# Patient Record
Sex: Female | Born: 1990 | Hispanic: No | Marital: Single | Smoking: Current every day smoker
Health system: Southern US, Community
[De-identification: ages and names within clinical notes are randomized; demographics above are authoritative.]

## PROBLEM LIST (undated history)

## (undated) ENCOUNTER — Inpatient Hospital Stay: Payer: Self-pay

## (undated) ENCOUNTER — Emergency Department: Admission: EM | Discharge status: Absent without leave | Payer: Medicaid Other

## (undated) DIAGNOSIS — S72092D Other fracture of head and neck of left femur, subsequent encounter for closed fracture with routine healing: Secondary | ICD-10-CM

## (undated) DIAGNOSIS — S322XXA Fracture of coccyx, initial encounter for closed fracture: Secondary | ICD-10-CM

## (undated) DIAGNOSIS — Z3490 Encounter for supervision of normal pregnancy, unspecified, unspecified trimester: Secondary | ICD-10-CM

## (undated) HISTORY — PX: TYMPANOSTOMY TUBE PLACEMENT: SHX32

---
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
## 2012-01-12 DIAGNOSIS — S322XXA Fracture of coccyx, initial encounter for closed fracture: Secondary | ICD-10-CM

## 2012-01-12 DIAGNOSIS — S72092D Other fracture of head and neck of left femur, subsequent encounter for closed fracture with routine healing: Secondary | ICD-10-CM

## 2012-01-12 HISTORY — DX: Fracture of coccyx, initial encounter for closed fracture: S32.2XXA

## 2012-01-12 HISTORY — DX: Other fracture of head and neck of left femur, subsequent encounter for closed fracture with routine healing: S72.092D

## 2012-05-15 ENCOUNTER — Emergency Department: Payer: Self-pay | Admitting: Emergency Medicine

## 2012-05-15 LAB — CBC
MCHC: 34.2 g/dL (ref 32.0–36.0)
Platelet: 378 10*3/uL (ref 150–440)
RDW: 13.6 % (ref 11.5–14.5)

## 2012-05-15 LAB — COMPREHENSIVE METABOLIC PANEL
Albumin: 3.7 g/dL (ref 3.4–5.0)
Anion Gap: 8 (ref 7–16)
BUN: 13 mg/dL (ref 7–18)
Bilirubin,Total: 0.2 mg/dL (ref 0.2–1.0)
Calcium, Total: 9.7 mg/dL (ref 8.5–10.1)
Chloride: 106 mmol/L (ref 98–107)
Creatinine: 0.93 mg/dL (ref 0.60–1.30)
Glucose: 124 mg/dL — ABNORMAL HIGH (ref 65–99)
Osmolality: 275 (ref 275–301)
Sodium: 137 mmol/L (ref 136–145)

## 2012-05-15 LAB — URINALYSIS, COMPLETE
Bilirubin,UR: NEGATIVE
Ketone: NEGATIVE
Protein: 100
RBC,UR: 596 /HPF (ref 0–5)
Squamous Epithelial: 3

## 2012-10-19 ENCOUNTER — Emergency Department: Payer: Self-pay | Admitting: Emergency Medicine

## 2012-10-19 LAB — CBC
HCT: 36.4 % (ref 35.0–47.0)
HGB: 12.5 g/dL (ref 12.0–16.0)
MCV: 85 fL (ref 80–100)
RBC: 4.27 10*6/uL (ref 3.80–5.20)
RDW: 13.7 % (ref 11.5–14.5)
WBC: 8.3 10*3/uL (ref 3.6–11.0)

## 2012-10-19 LAB — TROPONIN I: Troponin-I: <0.02 ng/mL

## 2012-10-19 LAB — BASIC METABOLIC PANEL
Anion Gap: 3 — ABNORMAL LOW (ref 7–16)
BUN: 10 mg/dL (ref 7–18)
Creatinine: 0.78 mg/dL (ref 0.60–1.30)
Potassium: 4.1 mmol/L (ref 3.5–5.1)

## 2013-06-22 ENCOUNTER — Emergency Department: Payer: Self-pay

## 2013-06-22 LAB — COMPREHENSIVE METABOLIC PANEL
ALBUMIN: 3.2 g/dL — AB (ref 3.4–5.0)
ALT: 63 U/L (ref 12–78)
AST: 35 U/L (ref 15–37)
Alkaline Phosphatase: 87 U/L
Anion Gap: 9 (ref 7–16)
BUN: 6 mg/dL — AB (ref 7–18)
Bilirubin,Total: 0.3 mg/dL (ref 0.2–1.0)
CHLORIDE: 106 mmol/L (ref 98–107)
Calcium, Total: 9.2 mg/dL (ref 8.5–10.1)
Co2: 23 mmol/L (ref 21–32)
Creatinine: 0.6 mg/dL (ref 0.60–1.30)
EGFR (African American): >60
Glucose: 95 mg/dL (ref 65–99)
Osmolality: 273 (ref 275–301)
Potassium: 4 mmol/L (ref 3.5–5.1)
Sodium: 138 mmol/L (ref 136–145)
Total Protein: 7.5 g/dL (ref 6.4–8.2)

## 2013-06-22 LAB — CBC WITH DIFFERENTIAL/PLATELET
Basophil #: 0 10*3/uL (ref 0.0–0.1)
Basophil %: 0.4 %
EOS PCT: 0.6 %
Eosinophil #: 0 10*3/uL (ref 0.0–0.7)
HCT: 39 % (ref 35.0–47.0)
HGB: 12.6 g/dL (ref 12.0–16.0)
LYMPHS PCT: 24.1 %
Lymphocyte #: 1.5 10*3/uL (ref 1.0–3.6)
MCH: 28.4 pg (ref 26.0–34.0)
MCHC: 32.3 g/dL (ref 32.0–36.0)
MCV: 88 fL (ref 80–100)
Monocyte #: 0.5 x10 3/mm (ref 0.2–0.9)
Monocyte %: 7.7 %
Neutrophil #: 4.3 10*3/uL (ref 1.4–6.5)
Neutrophil %: 67.2 %
PLATELETS: 424 10*3/uL (ref 150–440)
RBC: 4.45 10*6/uL (ref 3.80–5.20)
RDW: 13.6 % (ref 11.5–14.5)
WBC: 6.4 10*3/uL (ref 3.6–11.0)

## 2013-06-22 LAB — URINALYSIS, COMPLETE
BILIRUBIN, UR: NEGATIVE
GLUCOSE, UR: NEGATIVE mg/dL (ref 0–75)
Nitrite: NEGATIVE
PH: 6 (ref 4.5–8.0)
Protein: NEGATIVE
RBC,UR: 31 /HPF (ref 0–5)
SPECIFIC GRAVITY: 1.024 (ref 1.003–1.030)
Squamous Epithelial: 7
WBC UR: 8 /HPF (ref 0–5)

## 2013-08-27 ENCOUNTER — Emergency Department: Payer: Self-pay | Admitting: Emergency Medicine

## 2013-08-27 LAB — URINALYSIS, COMPLETE
BACTERIA: NONE SEEN
Bilirubin,UR: NEGATIVE
GLUCOSE, UR: NEGATIVE mg/dL (ref 0–75)
Ketone: NEGATIVE
Nitrite: NEGATIVE
PROTEIN: NEGATIVE
Ph: 6 (ref 4.5–8.0)
RBC,UR: 7 /HPF (ref 0–5)
SPECIFIC GRAVITY: 1.021 (ref 1.003–1.030)
Squamous Epithelial: 3
WBC UR: 1 /HPF (ref 0–5)

## 2013-08-27 LAB — WET PREP, GENITAL

## 2013-08-27 LAB — GC/CHLAMYDIA PROBE AMP

## 2014-01-19 ENCOUNTER — Emergency Department: Payer: Self-pay | Admitting: Emergency Medicine

## 2014-01-19 LAB — CBC
HCT: 36.3 % (ref 35.0–47.0)
HGB: 11.8 g/dL — AB (ref 12.0–16.0)
MCH: 28.3 pg (ref 26.0–34.0)
MCHC: 32.4 g/dL (ref 32.0–36.0)
MCV: 87 fL (ref 80–100)
Platelet: 359 10*3/uL (ref 150–440)
RBC: 4.17 10*6/uL (ref 3.80–5.20)
RDW: 13.4 % (ref 11.5–14.5)
WBC: 13.5 10*3/uL — AB (ref 3.6–11.0)

## 2014-01-19 LAB — MONONUCLEOSIS SCREEN: MONO TEST: NEGATIVE

## 2014-01-21 LAB — BETA STREP CULTURE(ARMC)
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
## 2014-01-28 ENCOUNTER — Emergency Department: Payer: Self-pay | Admitting: Emergency Medicine

## 2014-07-02 IMAGING — CR DG CHEST 2V
1 series · 3 of 3 positions shown · non-contrast
Comparison: none

REASON FOR EXAM: Chest Pain
COMMENTS:   LMP: 10/16/12

PROCEDURE:     DXR - DXR CHEST PA (OR AP) AND LATERAL  - October 19, 2012  [DATE]
RESULT:     The lungs are clear. The heart and pulmonary vessels are normal.
The bony and mediastinal structures are unremarkable. There is no effusion.
There is no pneumothorax or evidence of congestive failure.

[Series 1: pa · 0.17mm/px · 3 of 3 slices shown]
[im 1/3]
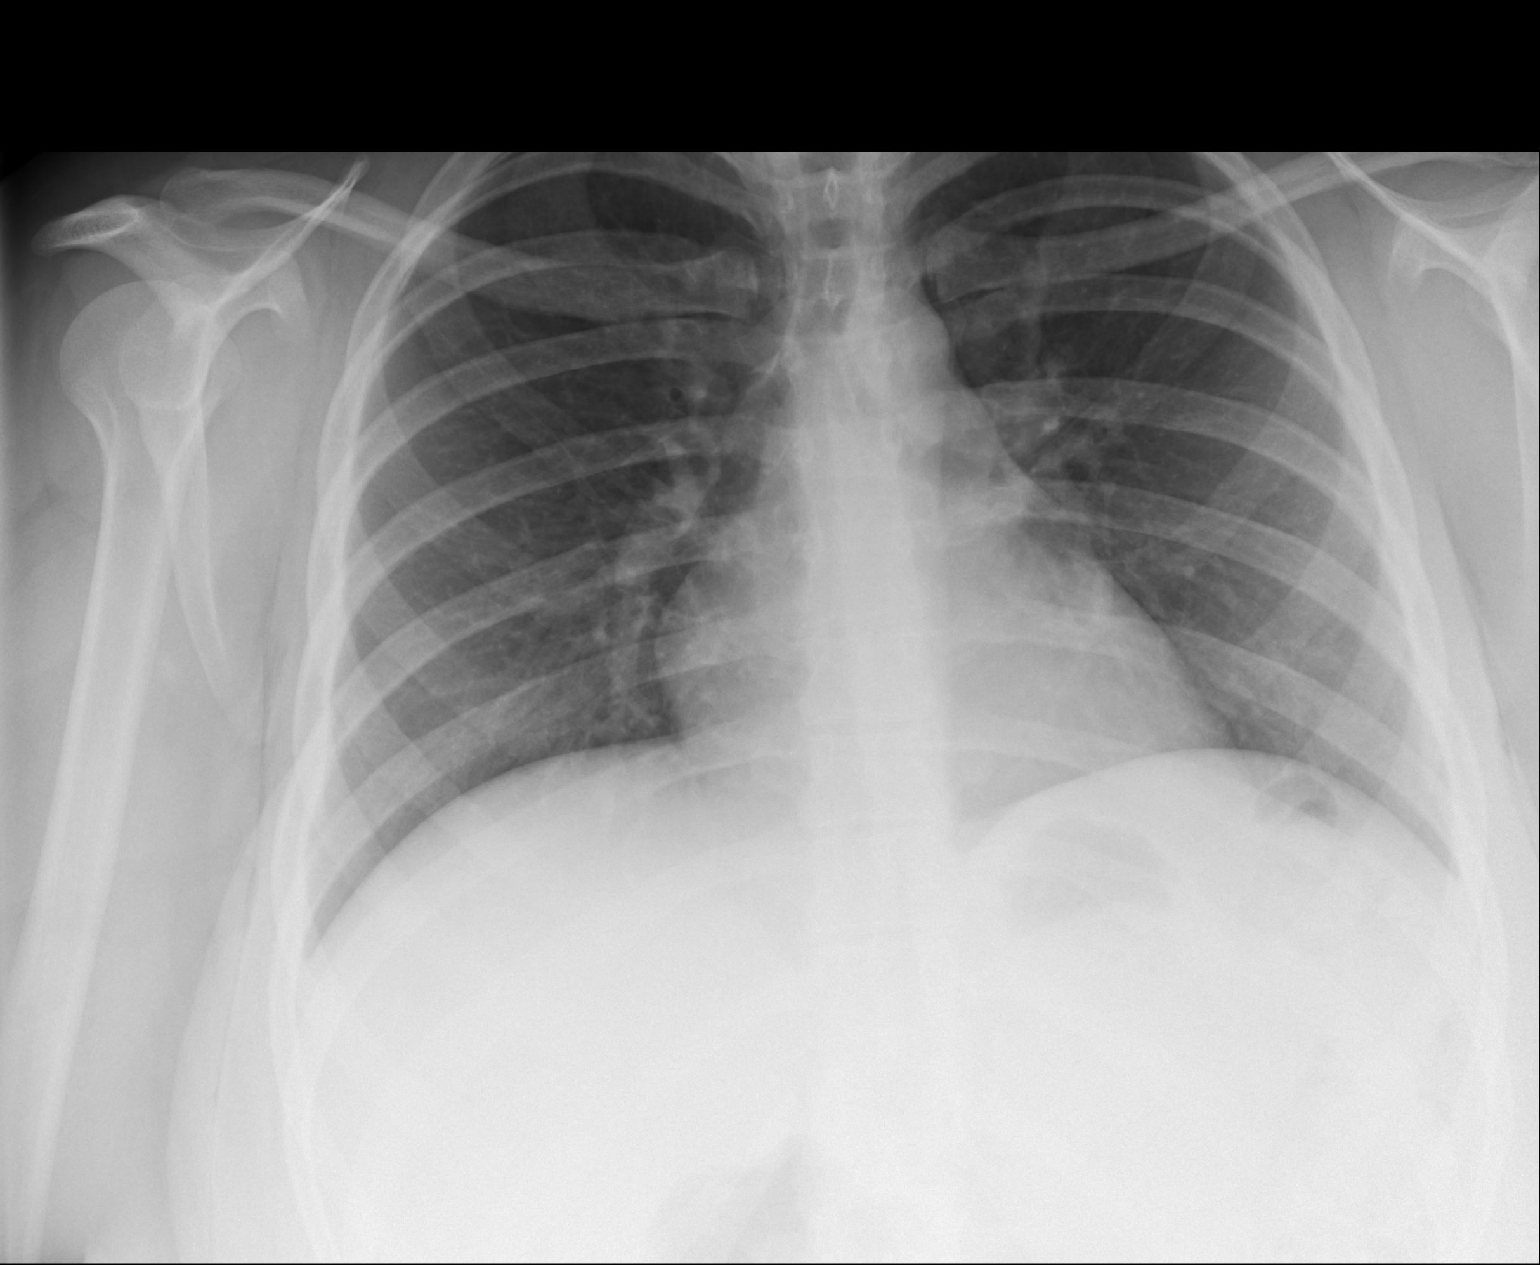
[im 2/3]
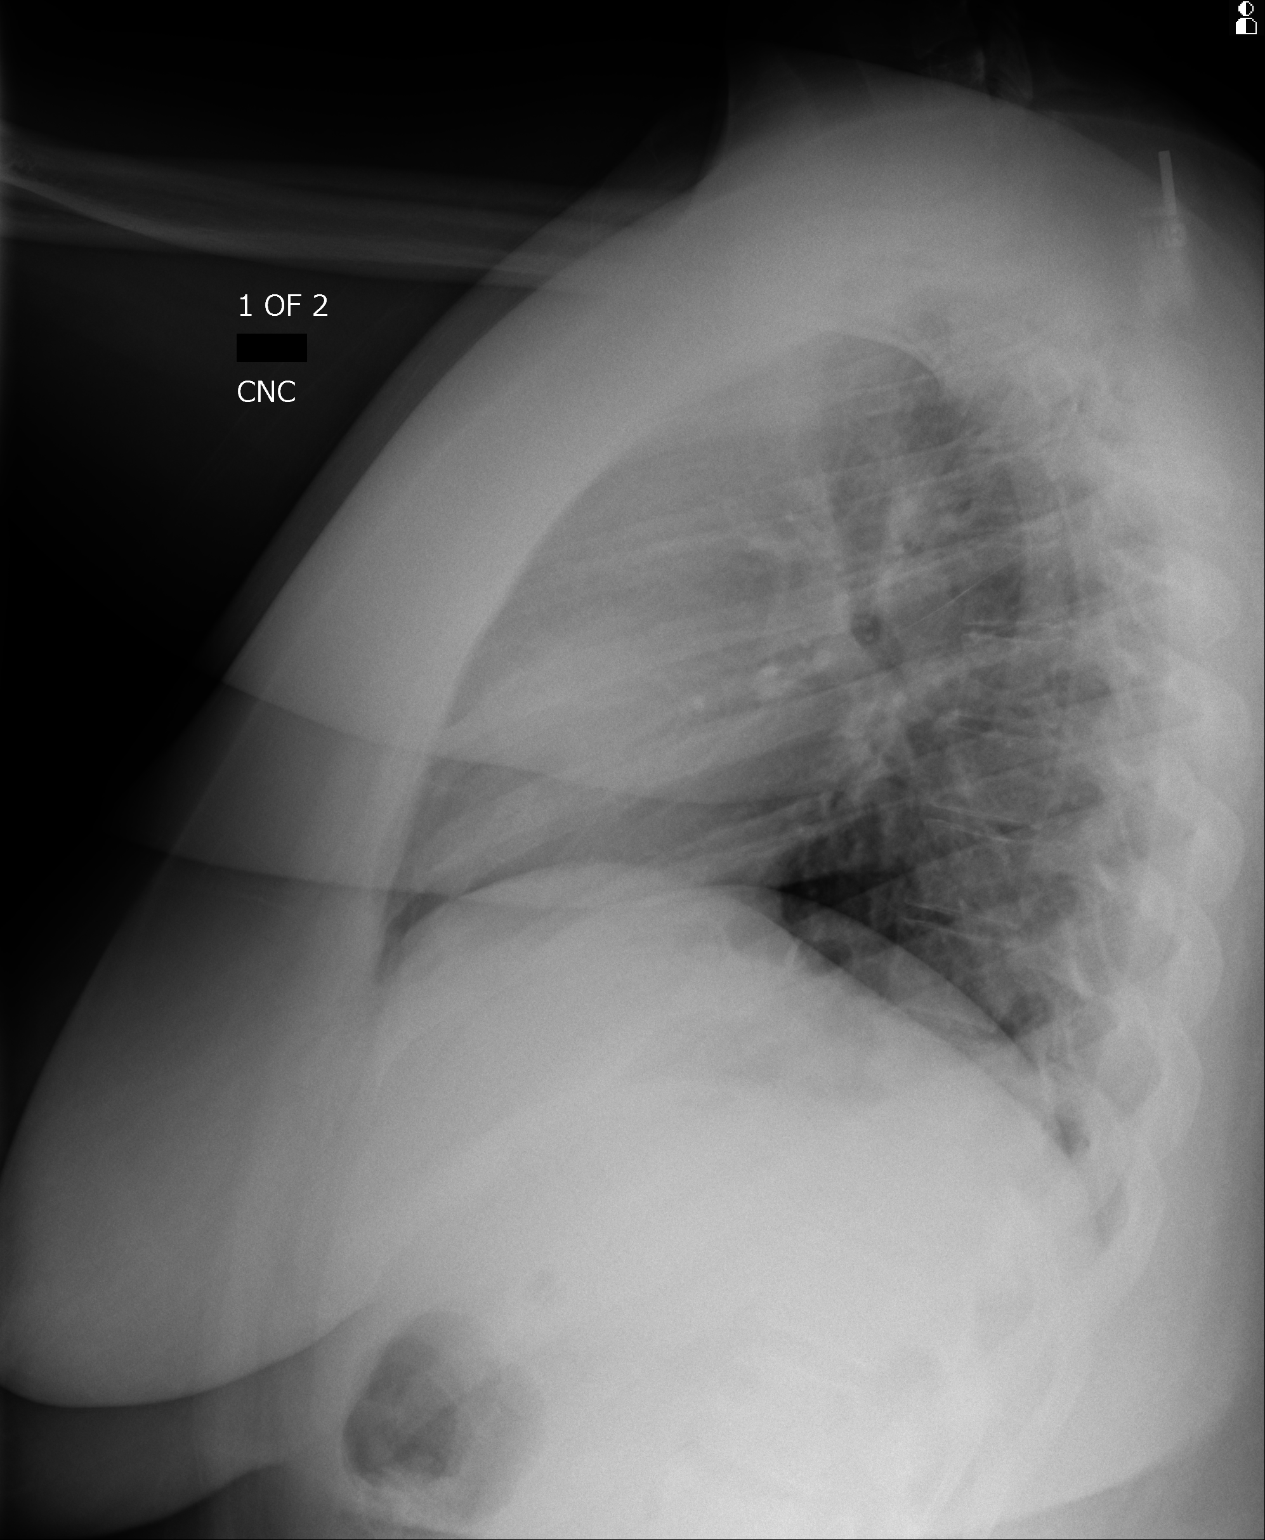
[im 3/3]
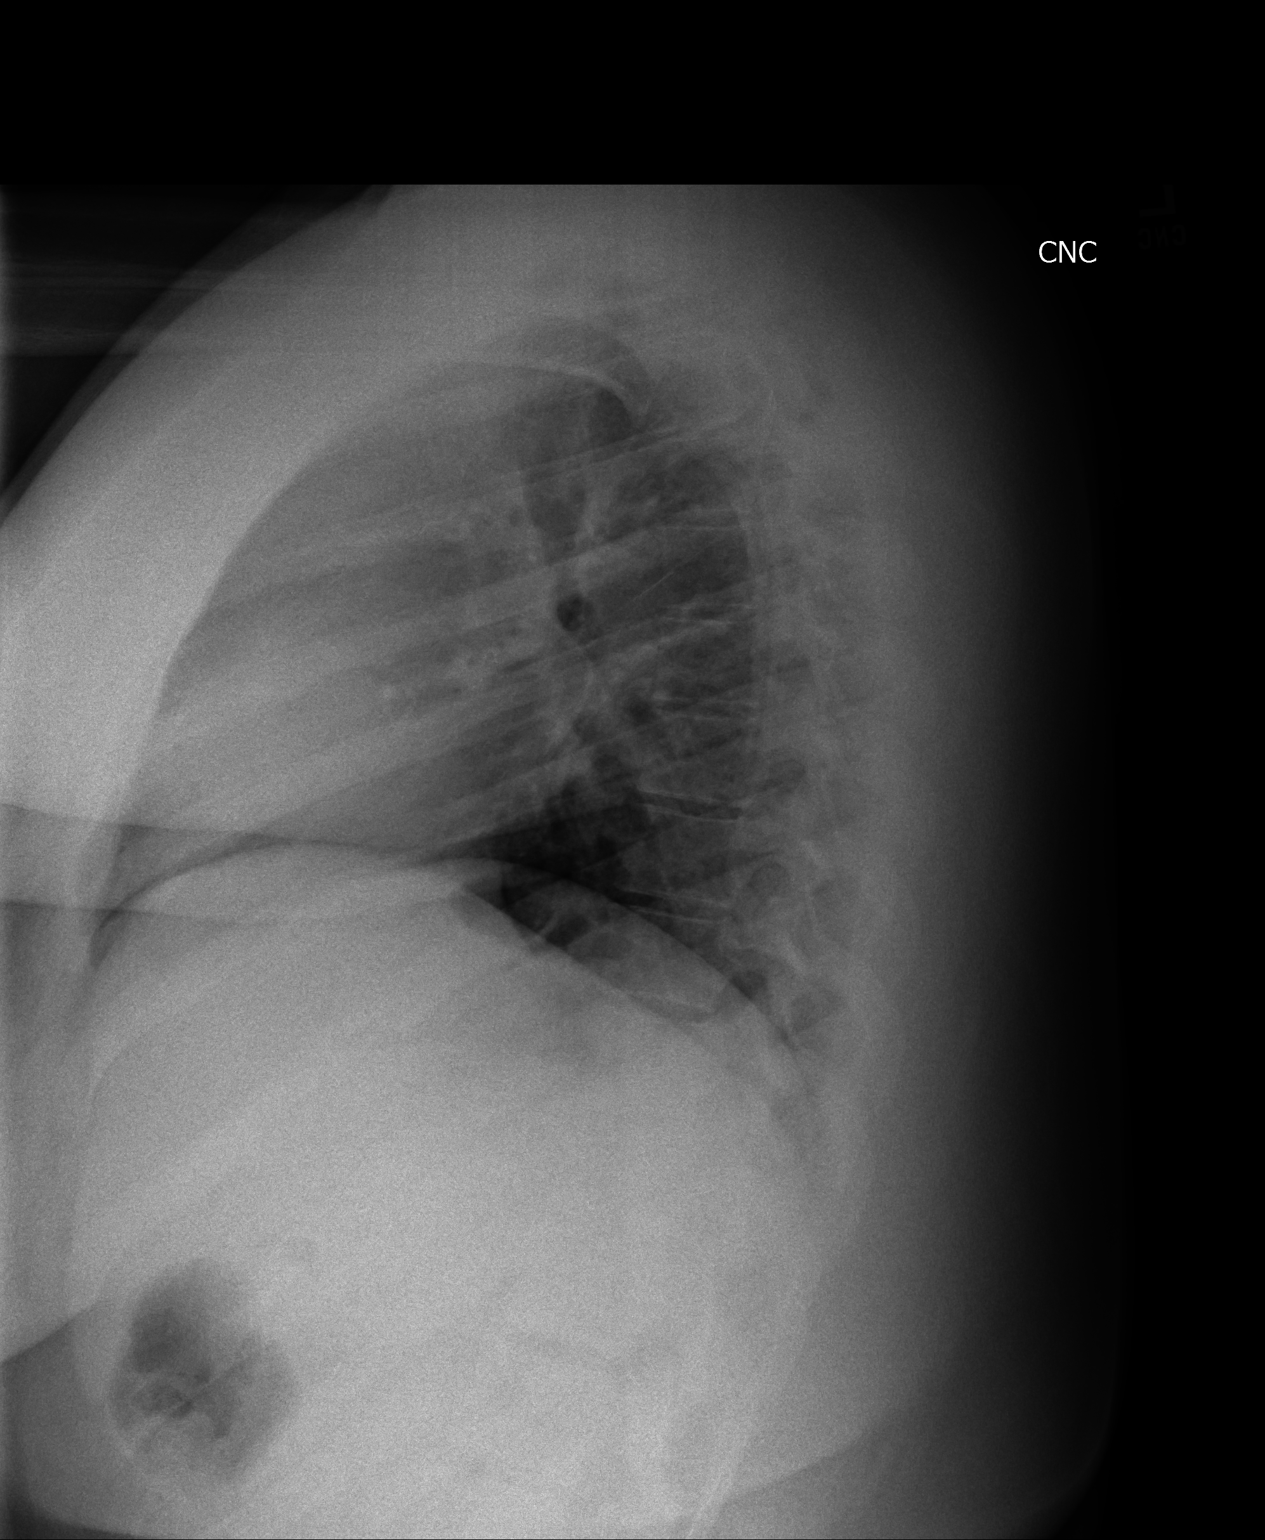

[3 of 3 positions shown; findings below may reference images not displayed]

IMPRESSION: No acute cardiopulmonary disease.

[REDACTED]

## 2014-10-01 ENCOUNTER — Ambulatory Visit (INDEPENDENT_AMBULATORY_CARE_PROVIDER_SITE_OTHER): Payer: BLUE CROSS/BLUE SHIELD | Admitting: Podiatry

## 2014-10-01 ENCOUNTER — Encounter: Payer: Self-pay | Admitting: Podiatry

## 2014-10-01 ENCOUNTER — Ambulatory Visit: Payer: BLUE CROSS/BLUE SHIELD

## 2014-10-01 VITALS — BP 120/69 | HR 67 | Resp 18

## 2014-10-01 DIAGNOSIS — R52 Pain, unspecified: Secondary | ICD-10-CM

## 2014-10-01 DIAGNOSIS — M779 Enthesopathy, unspecified: Secondary | ICD-10-CM | POA: Diagnosis not present

## 2014-10-01 DIAGNOSIS — M8430XA Stress fracture, unspecified site, initial encounter for fracture: Secondary | ICD-10-CM

## 2014-10-01 NOTE — Progress Notes (Signed)
   Subjective:    Patient ID: Susan Cantrell, female    DOB: 1990-10-07, 24 y.o.   MRN: [id number]  HPI  24 year old female presents the office or concerns of bilateral foot pain in the left more significant than the right. She states that she's had pain for about 2-3 weeks. She went to an urgent care/ER in [place] last Thursday she was given pain medicine. She states that he did not take x-rays. She denies any recent injury or trauma. She states her left foot is swollen without any redness or warmth. She's had no other treatment. No other complaints at this time.  She did find out just this morning that she is pregnant. She has not sure of how far along as she is and she has not seen an OB yet.   Review of Systems  All other systems reviewed and are negative.      Objective:   Physical Exam AAO x3, NAD DP/PT pulses palpable bilaterally, CRT less than 3 seconds Protective sensation intact with Simms Weinstein monofilament, vibratory sensation intact, Achilles tendon reflex intact There is mild edema to the dorsal aspect of left forefoot. There is pinpoint bony tenderness and pain vibratory sensation overlying the third metatarsal. There is no gross deformity present. There is mild discomfort along the fifth metatarsal base bilaterally at the insertion of the peroneal tendon. No pain on the course of the peroneal tendon otherwise. No pain with eversion. Peroneal tendons appear to be intact. There is a decrease in medial arch height upon weightbearing bilaterally with equinus present. No other areas of tenderness to bilateral lower extremities. MMT 5/5, ROM WNL.  No open lesions or pre-ulcerative lesions.  No overlying edema, erythema, increase in warmth to bilateral lower extremities.  No pain with calf compression, swelling, warmth, erythema bilaterally.      Assessment & Plan:  24 year old female with possible third metatarsal fracture/stress fracture -Treatment options discussed  including all alternatives, risks, and complications -As she she just noticed more that she was pregnant and has not seen a physician yet for this we're not sure how far along she has. We'll hold off on x-rays for now. -Given the pinpoint tenderness to the third metatarsal concern for stress fracture. Will immobilize and a surgical shoe. -Ice and elevation. -Recommended her to discuss with her physician in regards to anti-inflammatories. -Follow-up in 3 weeks or sooner if any problems arise. In the meantime, encouraged to call the office with any questions, concerns, change in symptoms.   Ovid Curd, DPM

## 2014-10-01 NOTE — Patient Instructions (Signed)
Metatarsal Stress Fracture When too much stress is put on the foot, as in running and jumping sports, the center shaft of the bones of the forefoot is very susceptible to stress fractures (break in bone). This is because of repetitive stress on the bone. This injury is more common if osteoporosis is present or if inadequate running shoes are used. Rapid increase in running distances are often the cause. Running distances should be gradually increased to avoid this problem. Shoes should be used which adequately cushion the foot. Shoes should absorb the shocks of the activity.  DIAGNOSIS  Usually the diagnosis is made by history. The foot progressively becomes sorer with activities. X-rays may be negative (show no break) within the first 2 to 3 weeks of the beginning of pain. A later X-ray may show signs of healing bone (callus formation). A bone scan or MRI will usually make the diagnosis earlier. TREATMENT AND HOME CARE INSTRUCTIONS  Treatment may or may not include a cast, removable fracture boot, or walking shoe. Casts are used for short periods of time to prevent muscle atrophy (muscle wasting).  Activities should be stopped until further advised by your caregiver.  Wear shoes with adequate shock absorbing abilities.  Alternative exercise may be undertaken while waiting for healing. These may include bicycling and swimming, or as your caregiver suggests. If you do not have a cast or splint:  You may walk on your injured foot as tolerated or advised.  Do not put any weight on your injured foot for as long as directed by your caregiver. Slowly increase the amount of time you walk on the foot as the pain allows or as advised.  Use crutches until you can bear weight without pain. A gradual increase in weight bearing may help.  Apply ice to the injury for 15-20 minutes each hour while awake for the first 2 days. Put the ice in a plastic bag and place a towel between the bag of ice and your  skin.  Only take over-the-counter or prescription medicines for pain, discomfort, or fever as directed by your caregiver. SEEK IMMEDIATE MEDICAL CARE IF:   Pain is becoming worse rather than better, or if pain is uncontrolled with medications.  You have increased swelling or redness in the foot. MAKE SURE YOU:   Understand these instructions.  Will watch your condition.  Will get help right away if you are not doing well or get worse. Document Released: 12/26/1999 Document Revised: 03/22/2011 Document Reviewed: 10/24/2007 [employer] Patient Information 2015 [employer]. This information is not intended to replace advice given to you by your health care provider. Make sure you discuss any questions you have with your health care provider.  

## 2014-10-17 ENCOUNTER — Ambulatory Visit (INDEPENDENT_AMBULATORY_CARE_PROVIDER_SITE_OTHER): Payer: BLUE CROSS/BLUE SHIELD | Admitting: Podiatry

## 2014-10-17 ENCOUNTER — Encounter: Payer: Self-pay | Admitting: Podiatry

## 2014-10-17 ENCOUNTER — Ambulatory Visit (INDEPENDENT_AMBULATORY_CARE_PROVIDER_SITE_OTHER): Payer: Medicaid Other

## 2014-10-17 VITALS — BP 116/55 | HR 84 | Resp 18

## 2014-10-17 DIAGNOSIS — M779 Enthesopathy, unspecified: Secondary | ICD-10-CM | POA: Diagnosis not present

## 2014-10-17 DIAGNOSIS — M7750 Other enthesopathy of unspecified foot: Secondary | ICD-10-CM

## 2014-10-17 DIAGNOSIS — R52 Pain, unspecified: Secondary | ICD-10-CM

## 2014-10-17 DIAGNOSIS — M8430XA Stress fracture, unspecified site, initial encounter for fracture: Secondary | ICD-10-CM | POA: Diagnosis not present

## 2014-10-17 MED ORDER — MELOXICAM 15 MG PO TABS: 15.0000 mg | ORAL_TABLET | Freq: Every day | ORAL | Status: DC

## 2014-10-17 NOTE — Progress Notes (Signed)
Patient ID: Susan Cantrell, female   DOB: 1990/05/14, 24 y.o.   MRN: [id number]  Subjective: 24 year old female presents the office they for follow-up evaluation of possible stress fracture to the left foot. She states that the pain has subsided with wearing the surgical shoe. She has tried to go without the surgical shoe. She doesn't expend some swelling which is decreased. Denies any redness of the area. Denies any recent injury or trauma. No other complaints at this time in no acute changes. She does state that she recently had a miscarriage.  Objective: AAO 3, NAD Neurovascular status intact and unchanged There is mild continued tenderness to palpation along the dorsal aspect the left forefoot. There is no pain on the plantar aspect of the forefoot. On palm palpation of the dorsal aspect of the left third metatarsal there is tenderness palpation to this area. There is mild edema to the dorsal aspect of the foot without any assisted erythema or increase in warmth. There is no other areas of pinpoint bony tenderness or tenderness to bilateral lower chemise. There is no open lesions or pre-ulcerative lesions. No pain with calf compression, swelling, warmth, erythema.  Assessment: 24 year old female with resolving left foot pain  Plan: -X-rays were obtained and reviewed with the patient. There is no definitive evidence of acute fracture or stress fracture identified this time -Treatment options discussed including all alternatives, risks, and complications -Prescribed mobic. Discussed side effects of the medication and directed to stop if any are to occur and call the office.  -She inserted transition back into regular shoe as tolerated. Is any increased pain to return to the surgical shoe and call the office. -Discussed wear supportive shoe at all times. -Follow-up in 4 weeks  or sooner if any problems arise. In the meantime, encouraged to call the office with any questions, concerns, change in  symptoms.   Ovid Curd, DPM

## 2014-11-14 ENCOUNTER — Ambulatory Visit (INDEPENDENT_AMBULATORY_CARE_PROVIDER_SITE_OTHER): Payer: BLUE CROSS/BLUE SHIELD | Admitting: Podiatry

## 2014-11-14 DIAGNOSIS — M779 Enthesopathy, unspecified: Secondary | ICD-10-CM

## 2014-11-14 NOTE — Progress Notes (Signed)
Patient ID: Susan Cantrell, female   DOB: 07/06/1990, 24 y.o.   MRN: [id number]  Subjective: 24 year old female presents the office they for follow-up of left foot pain. She states that she has continued the surgical shoe. She denies any pain to her foot denies any swelling. She states that she is much better and what she was. No recent injury or trauma. No other complaints at this time in no acute changes. She does state that she recently had a miscarriage.  Objective: AAO 3, NAD DP/PT pulses 2/4, CRT less than 3 seconds Protective sensation appears intact with Simms Weinstein monofilament At this time there is no areas of tenderness or pinpoint bony tenderness to bilateral lower extremity. There is no pain with vibratory sensation. There is no edema, erythema, increase in warmth. MMT 5/5, ROM WNL no open lesions or pre-ulcerative lesions. There is no pain with calf compression, swelling, warmth, erythema.  Assessment: 24 year old female with resolved left foot pain  Plan: -Treatment options discussed including all alternatives, risks, and complications -At this time she can wear regular shoe as tolerated. Is any increase in pain when he reoccurrence to call the office immediately. She can hold off on anti-inflammatories as her pain is resolved. Follow-up as needed. Call any questions or concerns in the meantime. -Discussed wear supportive shoe at all times.  Ovid CurdMatthew Wagoner, DPM

## 2014-12-26 ENCOUNTER — Emergency Department
Admission: EM | Admit: 2014-12-26 | Discharge: 2014-12-26 | Disposition: A | Discharge status: Home / Self Care | Payer: Medicaid Other | Attending: Emergency Medicine | Admitting: Emergency Medicine

## 2014-12-26 ENCOUNTER — Encounter: Payer: Self-pay | Admitting: *Deleted

## 2014-12-26 DIAGNOSIS — Y9389 Activity, other specified: Secondary | ICD-10-CM | POA: Diagnosis not present

## 2014-12-26 DIAGNOSIS — Y9289 Other specified places as the place of occurrence of the external cause: Secondary | ICD-10-CM | POA: Diagnosis not present

## 2014-12-26 DIAGNOSIS — Z79899 Other long term (current) drug therapy: Secondary | ICD-10-CM | POA: Insufficient documentation

## 2014-12-26 DIAGNOSIS — Y998 Other external cause status: Secondary | ICD-10-CM | POA: Diagnosis not present

## 2014-12-26 DIAGNOSIS — F329 Major depressive disorder, single episode, unspecified: Secondary | ICD-10-CM | POA: Diagnosis not present

## 2014-12-26 DIAGNOSIS — X58XXXA Exposure to other specified factors, initial encounter: Secondary | ICD-10-CM | POA: Insufficient documentation

## 2014-12-26 DIAGNOSIS — T148 Other injury of unspecified body region: Secondary | ICD-10-CM | POA: Diagnosis not present

## 2014-12-26 DIAGNOSIS — Z791 Long term (current) use of non-steroidal anti-inflammatories (NSAID): Secondary | ICD-10-CM | POA: Diagnosis not present

## 2014-12-26 DIAGNOSIS — W57XXXA Bitten or stung by nonvenomous insect and other nonvenomous arthropods, initial encounter: Secondary | ICD-10-CM

## 2014-12-26 DIAGNOSIS — R21 Rash and other nonspecific skin eruption: Secondary | ICD-10-CM | POA: Diagnosis present

## 2014-12-26 MED ORDER — METHYLPREDNISOLONE 4 MG PO TBPK: ORAL_TABLET | ORAL | Status: DC

## 2014-12-26 MED ORDER — HYDROXYZINE HCL 50 MG PO TABS: 50.0000 mg | ORAL_TABLET | Freq: Three times a day (TID) | ORAL | Status: DC | PRN

## 2014-12-26 MED ORDER — DEXAMETHASONE SODIUM PHOSPHATE 10 MG/ML IJ SOLN
10.0000 mg | Freq: Once | INTRAMUSCULAR | Status: AC
  Administered 2014-12-26: 10 mg via INTRAMUSCULAR
  Filled 2014-12-26: qty 1

## 2014-12-26 MED ORDER — HYDROXYZINE HCL 50 MG PO TABS
50.0000 mg | ORAL_TABLET | Freq: Once | ORAL | Status: AC
  Administered 2014-12-26: 50 mg via ORAL
  Filled 2014-12-26: qty 1

## 2014-12-26 NOTE — ED Notes (Signed)
Pt states she sat on her friends bed 2 days ago and now has purple bumps on her left leg that itch

## 2014-12-26 NOTE — ED Notes (Addendum)
Pt complains of left upper thigh  rash, denies pain only itching. Rash is red and localized.Hasn't taken any meds at home.

## 2014-12-26 NOTE — ED Provider Notes (Signed)
[facility] Emergency Department Provider Note  ____________________________________________  Time seen: Approximately 6:45 PM  I have reviewed the triage vital signs and the nursing notes.   HISTORY  Chief Complaint Rash    HPI Susan Cantrell is a 24 y.o. female patient complaining of rash to the upper posterior left thigh for 2 days. Patient denies any pain stated this intense itching. Patient notes redness is localized with papular lesions. No palliative measures taken his complaint. Patient she said at friend's house 2 days ago and noticed the purple bumps on the leg after leaving a friend's house.   History reviewed. No pertinent past medical history.  There are no active problems to display for this patient.   History reviewed. No pertinent past surgical history.  Current Outpatient Rx  Name  Route  Sig  Dispense  Refill  . citalopram (CELEXA) 20 MG tablet   Oral   Take by mouth.         . ferrous sulfate 325 (65 FE) MG tablet   Oral   Take by mouth.         . hydrOXYzine (ATARAX/VISTARIL) 50 MG tablet   Oral   Take 1 tablet (50 mg total) by mouth 3 (three) times daily as needed.   30 tablet   0   . meloxicam (MOBIC) 15 MG tablet   Oral   Take 1 tablet (15 mg total) by mouth daily.   30 tablet   2   . methylPREDNISolone (MEDROL DOSEPAK) 4 MG TBPK tablet      Take Tapered dose as directed   21 tablet   0   . naproxen (NAPROSYN) 500 MG tablet      TK 1 T PO BID WITH MEALS FOR 15 DAYS      0   . NECON 1/35, 28, tablet      TK 1 T PO QD      11     Dispense as written.   . norethindrone-ethinyl estradiol 1/35 (NECON 1/35, 28,) tablet      TAKE 1 TABLET BY MOUTH ONCE A DAY AS DIRECTED.         Marland Kitchen oxyCODONE (OXY IR/ROXICODONE) 5 MG immediate release tablet      Take 1-3 tablets every 4-6 hours as needed for pain. Taper as tolerated. No refills.           Allergies Oxycodone  History reviewed. No  pertinent family history.  Social History Social History  Substance Use Topics  . Smoking status: Never Smoker   . Smokeless tobacco: Never Used  . Alcohol Use: No    Review of Systems Constitutional: No fever/chills Eyes: No visual changes. ENT: No sore throat. Cardiovascular: Denies chest pain. Respiratory: Denies shortness of breath. Gastrointestinal: No abdominal pain.  No nausea, no vomiting.  No diarrhea.  No constipation. Genitourinary: Negative for dysuria. Musculoskeletal: Negative for back pain. Skin: Rash localized to left posterior thigh. Neurological: Negative for headaches, focal weakness or numbness. Psychiatric:Depression Hematological/Lymphatic: Allergic/Immunilogical: Oxycodone  10-point ROS otherwise negative.  ____________________________________________   PHYSICAL EXAM:  VITAL SIGNS: ED Triage Vitals  Enc Vitals Group     BP 12/26/14 1828 138/84 mmHg     Pulse Rate 12/26/14 1828 77     Resp 12/26/14 1828 18     Temp 12/26/14 1828 98.4 F (36.9 C)     Temp Source 12/26/14 1828 Oral     SpO2 12/26/14 1828 96 %     Weight  12/26/14 1828 238 lb (107.956 kg)     Height 12/26/14 1828 5\' 9"  (1.753 m)     Head Cir --      Peak Flow --      Pain Score 12/26/14 1836 0     Pain Loc --      Pain Edu? --      Excl. in GC? --     Constitutional: Alert and oriented. Well appearing and in no acute distress. Eyes: Conjunctivae are normal. PERRL. EOMI. Head: Atraumatic. Nose: No congestion/rhinnorhea. Mouth/Throat: Mucous membranes are moist.  Oropharynx non-erythematous. Neck: No stridor. No cervical spine tenderness to palpation.  Hematological/Lymphatic/Immunilogical: No cervical lymphadenopathy. Cardiovascular: Normal rate, regular rhythm. Grossly normal heart sounds.  Good peripheral circulation. Respiratory: Normal respiratory effort.  No retractions. Lungs CTAB. Gastrointestinal: Soft and nontender. No distention. No abdominal bruits. No CVA  tenderness. Musculoskeletal: No lower extremity tenderness nor edema.  No joint effusions. Neurologic:  Normal speech and language. No gross focal neurologic deficits are appreciated. No gait instability. Skin:  Skin is warm, dry and intact. No rash noted. Psychiatric: Mood and affect are normal. Speech and behavior are normal.  ____________________________________________   LABS (all labs ordered are listed, but only abnormal results are displayed)  Labs Reviewed - No data to display ____________________________________________  EKG   ____________________________________________  RADIOLOGY   ____________________________________________   PROCEDURES  Procedure(s) performed: None  Critical Care performed: No  ____________________________________________   INITIAL IMPRESSION / ASSESSMENT AND PLAN / ED COURSE  Pertinent labs & imaging results that were available during my care of the patient were reviewed by me and considered in my medical decision making (see chart for details).  Localized reaction to insect bites. Patient given discharge care instructions. Patient given prescription for Medrol dosepak and Atarax. Advised to follow-up with family doctor in one week if there is no improvement. ____________________________________________   FINAL CLINICAL IMPRESSION(S) / ED DIAGNOSES  Final diagnoses:  Insect bite      Joni ReiningRonald K Hakeen Shipes, PA-C 12/26/14 1852  Minna AntisKevin Paduchowski, MD 12/26/14 2306

## 2014-12-26 NOTE — ED Notes (Signed)
Pt is waiting post IM injection before discharge. RN error in charting and discharged pt out of epic. Pt at bedside waiting 20 min

## 2015-01-12 NOTE — L&D Delivery Note (Signed)
Delivery Note  First Stage: Induction of Labor: 10/04/15 @1100am  with Cervidil Repeat Cervidil 10/05/15 @ 0000  Pitocin initiated 10/05/15 @ 1533 Labor onset: 10/05/15: 1618 Augmentation : AROM, Pitocin  Analgesia Eliezer Lofts/Anesthesia intrapartum: Epidural AROM on 10/05/15 @1618  - clear fluid   Second Stage: Complete dilation at 0256 Onset of pushing at 0256 FHR second stage: Category 2 / Baseline: 145 bpm/ moderate variability/ + accels/ variable decels to 90 bpm with contractions and good return to baseline   Delivery of a viable female "London" at [phone] by Carlean JewsMeredith Aniello Christopoulos, CNM in ROA position, placed on mom's abdomen.  Baby London, dried, stimulated, and vigorously crying on mom's abdomen.  No nuchal cord Cord double clamped after cessation of pulsation, cut by FOB Cord blood sample N/A  Third Stage: Placenta delivered via Tomasa BlaseSchultz intact with 3 VC @ [phone] Placenta disposition: hospital disposal Uterine tone firm with massage / bleeding moderate, Pitocin 500mL IV Bolus infusing  Small 1st degree perineal laceration identified  Anesthesia for repair: epidural and 1% Lidocaine 30cc Repair: 3.0 Vicryl  Est. Blood Loss (mL): 300mL  Complications: none  Mom to postpartum.  Baby to Couplet care / Skin to Skin.  Newborn: Birth Weight: 3890grams (8#9.2oz) Apgar Scores: 9, 9 Feeding planned: Breast  Carlean JewsMeredith Corbett Moulder, CNM

## 2015-03-04 LAB — OB RESULTS CONSOLE GC/CHLAMYDIA
Chlamydia: NEGATIVE
GC PROBE AMP, GENITAL: NEGATIVE

## 2015-03-05 ENCOUNTER — Other Ambulatory Visit: Payer: Self-pay | Admitting: Obstetrics and Gynecology

## 2015-03-05 DIAGNOSIS — Z369 Encounter for antenatal screening, unspecified: Secondary | ICD-10-CM

## 2015-03-08 LAB — OB RESULTS CONSOLE HIV ANTIBODY (ROUTINE TESTING): HIV: NONREACTIVE

## 2015-03-17 ENCOUNTER — Ambulatory Visit: Payer: Medicaid Other

## 2015-03-24 ENCOUNTER — Ambulatory Visit
Admission: RE | Admit: 2015-03-24 | Discharge: 2015-03-24 | Disposition: A | Discharge status: Home / Self Care | Payer: Medicaid Other | Source: Ambulatory Visit | Attending: Obstetrics and Gynecology | Admitting: Obstetrics and Gynecology

## 2015-03-24 ENCOUNTER — Ambulatory Visit
Admission: RE | Admit: 2015-03-24 | Discharge: 2015-03-24 | Disposition: A | Discharge status: Home / Self Care | Payer: Medicaid Other | Source: Ambulatory Visit | Attending: Maternal and Fetal Medicine | Admitting: Maternal and Fetal Medicine

## 2015-03-24 VITALS — BP 122/90 | HR 78 | Wt 330.0 lb

## 2015-03-24 DIAGNOSIS — Z3491 Encounter for supervision of normal pregnancy, unspecified, first trimester: Secondary | ICD-10-CM | POA: Diagnosis not present

## 2015-03-24 DIAGNOSIS — Z369 Encounter for antenatal screening, unspecified: Secondary | ICD-10-CM

## 2015-03-24 DIAGNOSIS — Z3480 Encounter for supervision of other normal pregnancy, unspecified trimester: Secondary | ICD-10-CM | POA: Diagnosis present

## 2015-03-24 HISTORY — DX: Encounter for antenatal screening, unspecified: Z36.9

## 2015-03-24 NOTE — Progress Notes (Addendum)
Susan Cantrell, Susan Cantrell Length of Consultation: 40 minutes   [name]  [facility] to review prenatal screening and testing options.  This note summarizes the information we discussed.    We offered the following routine screening tests for this pregnancy:  First trimester screening, which includes nuchal translucency ultrasound screen and first trimester maternal serum marker screening.  The nuchal translucency has approximately an 80% detection rate for Down syndrome and can be positive for other chromosome abnormalities as well as congenital heart defects.  When combined with a maternal serum marker screening, the detection rate is up to 90% for Down syndrome and up to 97% for trisomy 18.     Maternal serum marker screening, a blood test that measures pregnancy proteins, can provide risk assessments for Down syndrome, trisomy 18, and open neural tube defects (spina bifida, anencephaly). Because it does not directly examine the fetus, it cannot positively diagnose or rule out these problems.  Targeted ultrasound uses high frequency sound waves to create an image of the developing fetus.  An ultrasound is often recommended as a routine means of evaluating the pregnancy.  It is also used to screen for fetal anatomy problems (for example, a heart defect) that might be suggestive of a chromosomal or other abnormality.   Should these screening tests indicate an increased concern, then the following additional testing options would be offered:  The chorionic villus sampling procedure is available for first trimester chromosome analysis.  This involves the withdrawal of a small amount of chorionic villi (tissue from the developing placenta).  Risk of pregnancy loss is estimated to be approximately 1 in 200 to 1 in 100 (0.5 to 1%).  There is approximately a 1% (1 in 100) chance that the CVS chromosome results will be unclear.  Chorionic villi cannot be  tested for neural tube defects.     Amniocentesis involves the removal of a small amount of amniotic fluid from the sac surrounding the fetus with the use of a thin needle inserted through the maternal abdomen and uterus.  Ultrasound guidance is used throughout the procedure.  Fetal cells from amniotic fluid are directly evaluated and > 99.5% of chromosome problems and > 98% of open neural tube defects can be detected. This procedure is generally performed after the 15th week of pregnancy.  The main risks to this procedure include complications leading to miscarriage in less than 1 in 200 cases (0.5%).  As another option for information if the pregnancy is suspected to be an an increased chance for certain chromosome conditions, we also reviewed the availability of cell free fetal DNA testing from maternal blood to determine whether or not the baby may have either Down syndrome, trisomy 7513, or trisomy 7918.  This test utilizes a maternal blood sample and DNA sequencing technology to isolate circulating cell free fetal DNA from maternal plasma.  The fetal DNA can then be analyzed for DNA sequences that are derived from the three most common chromosomes involved in aneuploidy, chromosomes 13, 18, and 21.  If the overall amount of DNA is greater than the expected level for any of these chromosomes, aneuploidy is suspected.  While we do not consider it a replacement for invasive testing and karyotype analysis, a negative result from this testing would be reassuring, though not a guarantee of a normal chromosome complement for the baby.  An abnormal result is certainly suggestive of an abnormal chromosome complement, though we would still recommend CVS  or amniocentesis to confirm any findings from this testing.   Cystic Fibrosis screening was also discussed with the patient. Cystic fibrosis (CF) is one of the most common genetic conditions in persons of Caucasian ancestry.  This condition occurs in approximately 1  in 2,500 Caucasian persons and results in thickened secretions in the lungs, digestive, and reproductive systems.  For a baby to be at risk for having CF, both of the parents must be carriers for this condition.  Approximately 1 in 39 Caucasian persons is a carrier for CF.  Current carrier testing looks for the most common mutations in the gene for CF and can detect approximately 90% of carriers in the Caucasian population.  This means that the carrier screening can greatly reduce, but cannot eliminate, the chance for an individual to have a child with CF.  If an individual is found to be a carrier for CF, then carrier testing would be available for the partner. As part of [place]'s newborn screening profile, all babies born in the state of [place] will have a two-tier screening process.  Specimens are first tested to determine the concentration of immunoreactive trypsinogen (IRT).  The top 5% of specimens with the highest IRT values then undergo DNA testing using a panel of over 40 common CF mutations.   We obtained a detailed family history and pregnancy history.  This is the first pregnancy for both the patient and her partner.  The patient stated that her maternal grandmother and great grandmother both had breast cancer. The grandmother was in her 31s and the age at diagnosis of the great grandmother is not known. We reviewed that the majority of breast cancer occurs by chance, particularly when it is diagnosed after menopause.  When there are multiple family members with breast cancer in their earlier years, the chance for a genetic factor is increased.  This family history is not particularly concerning for a strong genetic factor, but if any other family members were to be diagnosed, particularly prior to menopause, then a referral to cancer genetic counseling could be considered.  The remainder of the family history was reported to be unremarkable for birth defects, mental retardation,  recurrent pregnancy loss or known chromosome abnormalities.  [name] reported no complications or exposures in this pregnancy that would be expected to increase the risk for birth defects.  After consideration of the options, [name] elected to proceed with an ultrasound and first trimester screening.  Labs were not yet available to assess her sickle cell carrier status, but are typically performed with the prenatal labs at her OB.  The patient declined CF and SMA carrier screening.  An ultrasound was performed at the time of the visit.  The gestational age was consistent with 13 weeks.  Fetal anatomy could not be assessed due to early gestational age.  Please refer to the ultrasound report for details of that study.  [name] was encouraged to call with questions or concerns.  We can be contacted at [phone].  Cherly Anderson, MS, CGC  I was immediately available and supervising. Camelia Phenes, MD [facility]

## 2015-03-27 ENCOUNTER — Telehealth: Payer: Self-pay | Admitting: Obstetrics and Gynecology

## 2015-03-27 NOTE — Telephone Encounter (Signed)
  [name] elected to undergo First Trimester screening on 03/24/2015.  To review, first trimester screening, includes nuchal translucency ultrasound screen and/or first trimester maternal serum marker screening.  The nuchal translucency has approximately an 80% detection rate for Down syndrome and can be positive for other chromosome abnormalities as well as heart defects.  When combined with a maternal serum marker screening, the detection rate is up to 90% for Down syndrome and up to 97% for trisomy 13 and 18.     The results of the First Trimester Nuchal Translucency and Biochemical Screening were within normal range.  The risk for Down syndrome is now estimated to be less than 1 in 10,000.  The risk for Trisomy 13/18 is also estimated to be less than 1 in 10,000.  Should more definitive information be desired, we would offer amniocentesis.  Because we do not yet know the effectiveness of combined first and second trimester screening, we do not recommend a maternal serum screen to assess the chance for chromosome conditions.  However, if screening for neural tube defects is desired, maternal serum screening for AFP only can be performed between 15 and [redacted] weeks gestation.      Cherly Andersoneborah F. Danise Dehne, MS, CGC

## 2015-04-08 LAB — OB RESULTS CONSOLE RUBELLA ANTIBODY, IGM: Rubella: IMMUNE

## 2015-04-08 LAB — OB RESULTS CONSOLE RPR: RPR: NONREACTIVE

## 2015-04-08 LAB — OB RESULTS CONSOLE HEPATITIS B SURFACE ANTIGEN: HEP B S AG: NEGATIVE

## 2015-04-08 LAB — OB RESULTS CONSOLE HIV ANTIBODY (ROUTINE TESTING): HIV: NONREACTIVE

## 2015-04-08 LAB — OB RESULTS CONSOLE VARICELLA ZOSTER ANTIBODY, IGG: VARICELLA IGG: IMMUNE

## 2015-04-26 ENCOUNTER — Emergency Department: Payer: Medicaid Other

## 2015-04-26 ENCOUNTER — Encounter: Payer: Self-pay | Admitting: Emergency Medicine

## 2015-04-26 ENCOUNTER — Emergency Department
Admission: EM | Admit: 2015-04-26 | Discharge: 2015-04-26 | Disposition: A | Discharge status: Home / Self Care | Payer: Medicaid Other | Attending: Emergency Medicine | Admitting: Emergency Medicine

## 2015-04-26 DIAGNOSIS — M79661 Pain in right lower leg: Secondary | ICD-10-CM | POA: Diagnosis present

## 2015-04-26 DIAGNOSIS — X58XXXA Exposure to other specified factors, initial encounter: Secondary | ICD-10-CM | POA: Diagnosis not present

## 2015-04-26 DIAGNOSIS — Z3A17 17 weeks gestation of pregnancy: Secondary | ICD-10-CM | POA: Diagnosis not present

## 2015-04-26 DIAGNOSIS — Y939 Activity, unspecified: Secondary | ICD-10-CM | POA: Insufficient documentation

## 2015-04-26 DIAGNOSIS — O9989 Other specified diseases and conditions complicating pregnancy, childbirth and the puerperium: Secondary | ICD-10-CM | POA: Insufficient documentation

## 2015-04-26 DIAGNOSIS — Y929 Unspecified place or not applicable: Secondary | ICD-10-CM | POA: Diagnosis not present

## 2015-04-26 DIAGNOSIS — Y999 Unspecified external cause status: Secondary | ICD-10-CM | POA: Insufficient documentation

## 2015-04-26 DIAGNOSIS — S86111A Strain of other muscle(s) and tendon(s) of posterior muscle group at lower leg level, right leg, initial encounter: Secondary | ICD-10-CM

## 2015-04-26 DIAGNOSIS — S86811A Strain of other muscle(s) and tendon(s) at lower leg level, right leg, initial encounter: Secondary | ICD-10-CM | POA: Insufficient documentation

## 2015-04-26 HISTORY — DX: Encounter for supervision of normal pregnancy, unspecified, unspecified trimester: Z34.90

## 2015-04-26 NOTE — ED Provider Notes (Signed)
[facility] Emergency Department Provider Note ____________________________________________  Time seen: 1525  I have reviewed the triage vital signs and the nursing notes.  HISTORY  Chief Complaint  Leg Pain  HPI Susan Cantrell is a 25 y.o. female presents to the ED for evaluation of right half pain with onset yesterday. She is [redacted] weeks pregnant with a singleton pregnancy. She denies any particular the complications. She denies any recent injury, trauma, accident, or prolonged sitting. She also denies any swelling to the legs. She noted tenderness to the mid calf yesterday, describing what she noted as a "knot". She denies any distal paresthesias, shortness of breath, or anxiety. She rates her discomfort at a 7/10 in triage. She describes the pain as throbbing and tightness intermittently. She notes that it is increased with dorsiflexion of the ankle.  Past Medical History  Diagnosis Date  . Pregnancy     [facility] 09/29/2015    Patient Active Problem List   Diagnosis Date Noted  . First trimester screening 03/24/2015    History reviewed. No pertinent past surgical history.  Current Outpatient Rx  Name  Route  Sig  Dispense  Refill  . citalopram (CELEXA) 20 MG tablet   Oral   Take by mouth. Reported on 03/24/2015         . ferrous sulfate 325 (65 FE) MG tablet   Oral   Take by mouth. Reported on 03/24/2015         . hydrOXYzine (ATARAX/VISTARIL) 50 MG tablet   Oral   Take 1 tablet (50 mg total) by mouth 3 (three) times daily as needed. Patient not taking: Reported on 03/24/2015   30 tablet   0   . meloxicam (MOBIC) 15 MG tablet   Oral   Take 1 tablet (15 mg total) by mouth daily. Patient not taking: Reported on 03/24/2015   30 tablet   2   . methylPREDNISolone (MEDROL DOSEPAK) 4 MG TBPK tablet      Take Tapered dose as directed Patient not taking: Reported on 03/24/2015   21 tablet   0   . naproxen (NAPROSYN) 500 MG tablet      Reported on  03/24/2015      0   . NECON 1/35, 28, tablet      Reported on 03/24/2015      11     Dispense as written.   . norethindrone-ethinyl estradiol 1/35 (NECON 1/35, 28,) tablet      Reported on 03/24/2015         . oxyCODONE (OXY IR/ROXICODONE) 5 MG immediate release tablet      Reported on 03/24/2015         . Prenatal Vit-Fe Fumarate-FA (PRENATAL MULTIVITAMIN) TABS tablet   Oral   Take 1 tablet by mouth daily at 12 noon.          Allergies Oxycodone  Family History  Problem Relation Age of Onset  . Asthma Brother   . Cancer Maternal Grandmother   . Diabetes Maternal Grandmother     Social History Social History  Substance Use Topics  . Smoking status: Never Smoker   . Smokeless tobacco: Never Used  . Alcohol Use: No   Review of Systems  Constitutional: Negative for fever. Cardiovascular: Negative for chest pain. Respiratory: Negative for shortness of breath. Gastrointestinal: Negative for abdominal pain, vomiting and diarrhea. Genitourinary: Negative for dysuria. Musculoskeletal: Negative for back pain. Right calf pain as above Skin: Negative for rash. Neurological: Negative for  headaches, focal weakness or numbness. ____________________________________________  PHYSICAL EXAM:  VITAL SIGNS: ED Triage Vitals  Enc Vitals Group     BP 04/26/15 1204 109/66 mmHg     Pulse Rate 04/26/15 1204 85     Resp 04/26/15 1204 20     Temp 04/26/15 1204 98.2 F (36.8 C)     Temp Source 04/26/15 1204 Oral     SpO2 04/26/15 1204 97 %     Weight 04/26/15 1204 328 lb (148.78 kg)     Height 04/26/15 1204 5\' 8"  (1.727 m)     Head Cir --      Peak Flow --      Pain Score 04/26/15 1206 7     Pain Loc --      Pain Edu? --      Excl. in GC? --    Constitutional: Alert and oriented. Well appearing and in no distress. Head: Normocephalic and atraumatic     Eyes: Conjunctivae are normal. PERRL. Normal extraocular movements Hematological/Lymphatic/Immunological: No  cervical lymphadenopathy. Cardiovascular: Normal rate, regular rhythm. Normal distal pulses.  Respiratory: Normal respiratory effort. No wheezes/rales/rhonchi. Gastrointestinal: Soft and nontender. No distention, rebound, guarding. Musculoskeletal: Right calf with tenderness to palp at the medial gastroc. No palpable chords, or muscle defect. No popliteal fossa fullness. Normal ankle dorsiflexion/plantarflexion. No achilles tenderness. Nontender with normal range of motion in all extremities.  Neurologic:  Normal gait without ataxia. Normal speech and language. No gross focal neurologic deficits are appreciated. Skin:  Skin is warm, dry and intact. No rash noted. No erythema, ecchymosis, or bruising.  Psychiatric: Mood and affect are normal. Patient exhibits appropriate insight and judgment. ____________________________________________   LABS (pertinent positives/negatives) Labs Reviewed - No data to display ____________________________________________   RADIOLOGY  RLE Ultrasound/Doppler IMPRESSION: No evidence DVT within the right lower extremity. ____________________________________________  INITIAL IMPRESSION / ASSESSMENT AND PLAN / ED COURSE  Patient with a negative ultrasound/Doppler study for DVT. Her exam seems consistent with an acute muscle strain to the right gastrocnemius. She is advised to apply ice therapy and/or moist heat to the area. She is also advised to dose Tylenol as needed for pain. She will follow with a primary provider or OB provider for ongoing symptom management. Patient is reassured by exam and diagnostic testing. ____________________________________________  FINAL CLINICAL IMPRESSION(S) / ED DIAGNOSES  Final diagnoses:  Gastrocnemius muscle strain, right, initial encounter      Lissa HoardJenise V Bacon Darlisa Spruiell, PA-C 04/26/15 1654  Sharyn CreamerMark Quale, MD 04/27/15 [phone]

## 2015-04-26 NOTE — Discharge Instructions (Signed)
Muscle Strain A muscle strain is an injury that occurs when a muscle is stretched beyond its normal length. Usually a small number of muscle fibers are torn when this happens. Muscle strain is rated in degrees. First-degree strains have the least amount of muscle fiber tearing and pain. Second-degree and third-degree strains have increasingly more tearing and pain.  Usually, recovery from muscle strain takes 1-2 weeks. Complete healing takes 5-6 weeks.  CAUSES  Muscle strain happens when a sudden, violent force placed on a muscle stretches it too far. This may occur with lifting, sports, or a fall.  RISK FACTORS Muscle strain is especially common in athletes.  SIGNS AND SYMPTOMS At the site of the muscle strain, there may be:  Pain.  Bruising.  Swelling.  Difficulty using the muscle due to pain or lack of normal function. DIAGNOSIS  Your health care provider will perform a physical exam and ask about your medical history. TREATMENT  Often, the best treatment for a muscle strain is resting, icing, and applying cold compresses to the injured area.  HOME CARE INSTRUCTIONS   Use the PRICE method of treatment to promote muscle healing during the first 2-3 days after your injury. The PRICE method involves:  Protecting the muscle from being injured again.  Restricting your activity and resting the injured body part.  Icing your injury. To do this, put ice in a plastic bag. Place a towel between your skin and the bag. Then, apply the ice and leave it on from 15-20 minutes each hour. After the third day, switch to moist heat packs.  Apply compression to the injured area with a splint or elastic bandage. Be careful not to wrap it too tightly. This may interfere with blood circulation or increase swelling.  Elevate the injured body part above the level of your heart as often as you can.  Only take over-the-counter or prescription medicines for pain, discomfort, or fever as directed by your  health care provider.  Warming up prior to exercise helps to prevent future muscle strains. SEEK MEDICAL CARE IF:   You have increasing pain or swelling in the injured area.  You have numbness, tingling, or a significant loss of strength in the injured area. MAKE SURE YOU:   Understand these instructions.  Will watch your condition.  Will get help right away if you are not doing well or get worse.   This information is not intended to replace advice given to you by your health care provider. Make sure you discuss any questions you have with your health care provider.   Document Released: 12/28/2004 Document Revised: 10/18/2012 Document Reviewed: 07/27/2012 Elsevier Interactive Patient Education 2016 [employer].  Medial Head Gastrocnemius Tear With Rehab Medial head gastrocnemius tear, also called tennis leg, is a tear (strain) in a muscle or tendon of the inner portion (medial head) of one of the calf muscles (gastrocnemius). The inner portion of the calf muscle attaches to the thigh bone (femur) and is responsible for bending the knee and straightening the foot (standing "on tiptoe"). Strains are classified into three categories. Grade 1 strains cause pain, but the tendon is not lengthened. Grade 2 strains include a lengthened ligament, due to the ligament being stretched or partially ruptured. With grade 2 strains there is still function, although function may be decreased. Grade 3 strains involve a complete tear of the tendon or muscle, and function is usually impaired. SYMPTOMS   Sudden "pop" or tear felt at the time of injury.  Pain, tenderness, swelling, warmth, or redness over the middle inner calf.  Pain and weakness with ankle motion, especially flexing the ankle against resistance, as well as pain with lifting up the foot (extending the ankle).  Bruising (contusion) of the calf, heel, and sometimes the foot within 48 hours of injury.  Muscle spasm in the calf. CAUSES    Muscle and ligament strains occur when a force is placed on the muscle or ligament that is greater than it can handle. Common causes of injury include:  Direct hit (trauma) to the calf.  Sudden forceful pushing off or landing on the foot (jumping, landing, serving a tennis ball, lunging). RISK INCREASES WITH:  Sports that require sudden, explosive calf muscle contraction, such as those involving jumping (basketball), hill running, quick starts (running), or lunging (racquetball, tennis).  Contact sports (football, soccer, hockey).  Poor strength and flexibility.  Previous lower limb injury. PREVENTION  Warm up and stretch properly before activity.  Allow for adequate recovery between workouts.  Maintain physical fitness:  Strength, flexibility, and endurance.  Cardiovascular fitness.  Learn and use proper exercise technique.  Complete rehabilitation after lower limb injury, before returning to competition or practice. PROGNOSIS  If treated properly, tennis leg usually heals within 6 weeks of nonsurgical treatment.  RELATED COMPLICATIONS   Longer healing time, if not properly treated or if not given enough time to heal.  Recurring symptoms and injury, if activity is resumed too soon, with overuse, with a direct blow, or with poor technique.  If untreated, may progress to a complete tear (rare) or other injury, due to limping and favoring of the injured leg.  Persistent limping, due to scarring and shortening of the calf muscles, as a result of inadequate rehabilitation.  Prolonged disability. TREATMENT  Treatment first involves the use of ice and medication to help reduce pain and inflammation. The use of strengthening and stretching exercises may help reduce pain with activity. These exercises may be performed at home or with a therapist. For severe injuries, referral to a therapist may be needed for further evaluation and treatment. Your caregiver may advise that you  wear a brace to help healing. Sometimes, crutches are needed until you can walk without limping. Rarely, surgery is needed.  MEDICATION   If pain medicine is needed, nonsteroidal anti-inflammatory medicines (aspirin and ibuprofen), or other minor pain relievers (acetaminophen), are often advised.  Do not take pain medicine for 7 days before surgery.  Prescription pain relievers may be given, if your caregiver thinks they are needed. Use only as directed and only as much as you need. HEAT AND COLD  Cold treatment (icing) should be applied for 10 to 15 minutes every 2 to 3 hours for inflammation and pain, and immediately after activity that aggravates your symptoms. Use ice packs or an ice massage.  Heat treatment may be used before performing stretching and strengthening activities prescribed by your caregiver, physical therapist, or athletic trainer. Use a heat pack or a warm water soak. SEEK MEDICAL CARE IF:   Symptoms get worse or do not improve in 2 weeks, despite treatment.  Numbness or tingling develops.  New, unexplained symptoms develop. (Drugs used in treatment may produce side effects.) EXERCISES  RANGE OF MOTION (ROM) AND STRETCHING EXERCISES - Medial Head Gastrocnemius Tear (Tennis Leg) These exercises may help you when beginning to rehabilitate your injury. Your symptoms may resolve with or without further involvement from your physician, physical therapist, or athletic trainer. While completing these exercises, remember:  Restoring tissue flexibility helps normal motion to return to the joints. This allows healthier, less painful movement and activity.  An effective stretch should be held for at least 30 seconds.  A stretch should never be painful. You should only feel a gentle lengthening or release in the stretched tissue. STRETCH - Gastrocsoleus  Sit with your right / left leg extended. Holding onto both ends of a belt or towel, loop it around the ball of your  foot.  Keeping your right / left ankle and foot relaxed and your knee straight, pull your foot and ankle toward you using the belt.  You should feel a gentle stretch behind your calf or knee. Hold this position for __________ seconds. Repeat __________ times. Complete this stretch __________ times per day.  RANGE OF MOTION - Ankle Dorsiflexion, Active Assisted   Remove your shoes and sit on a chair, preferably not on a carpeted surface.  Place your right / left foot directly under the knee. Extend your opposite leg for support.  Keeping your heel down, slide your right / left foot back toward the chair, until you feel a stretch at your ankle or calf. If you do not feel a stretch, slide your bottom forward to the edge of the chair, while still keeping your heel down.  Hold this stretch for __________ seconds. Repeat __________ times. Complete this stretch __________ times per day.  STRETCH - Gastroc, Standing   Place your hands on a wall.  Extend your right / left leg behind you, keeping the front knee somewhat bent.  Slightly point your toes inward on your back foot.  Keeping your right / left heel on the floor and your knee straight, shift your weight toward the wall, not allowing your back to arch.  You should feel a gentle stretch in the right / left calf. Hold this position for __________ seconds. Repeat __________ times. Complete this stretch __________ times per day. STRETCH - Soleus, Standing   Place your hands on a wall.  Extend your right / left leg behind you, keeping the other knee somewhat bent.  Point your toes of your back foot slightly inward.  Keep your right / left heel on the floor, bend your back knee, and slightly shift your weight over the back leg so that you feel a gentle stretch deep in your back calf.  Hold this position for __________ seconds. Repeat __________ times. Complete this stretch __________ times per day. STRETCH - Gastrocsoleus,  Standing Note: This exercise can place a lot of stress on your foot and ankle. Please complete this exercise only if specifically instructed by your caregiver.   Place the ball of your right / left foot on a step, keeping your other foot firmly on the same step.  Hold on to the wall or a rail for balance.  Slowly lift your other foot, allowing your body weight to press your heel down over the edge of the step.  You should feel a stretch in your right / left calf.  Hold this position for __________ seconds.  Repeat this exercise with a slight bend in your right / left knee. Repeat __________ times. Complete this stretch __________ times per day.  STRENGTHENING EXERCISES - Medial Head Gastrocnemius Tear (Tennis Leg) These exercises may help you when beginning to rehabilitate your injury. They may resolve your symptoms with or without further involvement from your physician, physical therapist, or athletic trainer. While completing these exercises, remember:   Muscles can gain both  the endurance and the strength needed for everyday activities through controlled exercises.  Complete these exercises as instructed by your physician, physical therapist, or athletic trainer. Increase the resistance and repetitions only as guided by your caregiver. STRENGTH - Plantar-flexors  Sit with your right / left leg extended. Holding onto both ends of a rubber exercise band or tubing, loop it around the ball of your foot. Keep a slight tension in the band.  Slowly push your toes away from you, pointing them downward.  Hold this position for __________ seconds. Return slowly, controlling the tension in the band. Repeat __________ times. Complete this exercise __________ times per day.  STRENGTH - Plantar-flexors  Stand with your feet shoulder width apart. Steady yourself with a wall or table, using as little support as needed.  Keeping your weight evenly spread over the width of your feet, rise up on  your toes.*  Hold this position for __________ seconds. Repeat __________ times. Complete this exercise __________ times per day.  *If this is too easy, shift your weight toward your right / left leg until you feel challenged. Ultimately, you may be asked to do this exercise while standing on your right / left foot only. STRENGTH - Plantar-flexors, Eccentric Note: This exercise can place a lot of stress on your foot and ankle. Please complete this exercise only if specifically instructed by your caregiver.   Place the balls of your feet on a step. With your hands, use only enough support from a wall or rail to keep your balance.  Keep your knees straight and rise up on your toes.  Slowly shift your weight entirely to your right / left toes and pick up your opposite foot. Gently and with controlled movement, lower your weight through your right / left foot so that your heel drops below the level of the step. You will feel a slight stretch in the back of your right / left calf.  Use the healthy leg to help rise up onto the balls of both feet, then lower weight only onto the right / left leg again. Build up to 15 repetitions. Then progress to 3 sets of 15 repetitions.*  After completing the above exercise, complete the same exercise with a slight knee bend (about 30 degrees). Again, build up to 15 repetitions. Then progress to 3 sets of 15 repetitions.* Perform this exercise __________ times per day.  *When you easily complete 3 sets of 15, your physician, physical therapist, or athletic trainer may advise you to add resistance, by wearing a backpack filled with additional weight.   This information is not intended to replace advice given to you by your health care provider. Make sure you discuss any questions you have with your health care provider.   Document Released: 12/28/2004 Document Revised: 01/18/2014 Document Reviewed: 04/11/2008 Elsevier Interactive Patient Education 2016 [employer]   Your Doppler is negative for a blood clot in the right leg. You are likely experiencing pain due to a muscle strain of the calf. Take Tylenol as needed and stretch the calf as discussed. Follow-up with your provider for continued symptoms.

## 2015-04-26 NOTE — ED Notes (Addendum)
R calf pain began yesterday. Denies long sitting periods. Is [redacted] weeks pregnant. No respiratory distress.

## 2015-07-21 NOTE — Addendum Note (Signed)
Encounter addended by: Camelia PhenesBrenna L Hughes, MD on: 07/21/2015 11:19 AM<BR>     Documentation filed: Notes Section

## 2015-08-05 ENCOUNTER — Inpatient Hospital Stay
Admission: EM | Admit: 2015-08-05 | Discharge: 2015-08-05 | Disposition: A | Discharge status: Home / Self Care | Payer: Medicaid Other | Admitting: Obstetrics and Gynecology

## 2015-08-05 ENCOUNTER — Emergency Department
Admission: EM | Admit: 2015-08-05 | Discharge: 2015-08-05 | Disposition: A | Discharge status: Home / Self Care | Payer: Medicaid Other | Attending: Emergency Medicine | Admitting: Emergency Medicine

## 2015-08-05 DIAGNOSIS — M545 Low back pain, unspecified: Secondary | ICD-10-CM

## 2015-08-05 MED ORDER — CYCLOBENZAPRINE HCL 10 MG PO TABS
ORAL_TABLET | ORAL | Status: AC
  Administered 2015-08-05: 5 mg via ORAL
  Filled 2015-08-05: qty 1

## 2015-08-05 MED ORDER — LIDOCAINE 5 % EX PTCH
MEDICATED_PATCH | CUTANEOUS | Status: AC
  Administered 2015-08-05: 1 via TRANSDERMAL
  Filled 2015-08-05: qty 1

## 2015-08-05 MED ORDER — LIDOCAINE 5 % EX PTCH
1.0000 | MEDICATED_PATCH | CUTANEOUS | Status: DC
  Administered 2015-08-05: 1 via TRANSDERMAL
  Filled 2015-08-05: qty 1

## 2015-08-05 MED ORDER — CYCLOBENZAPRINE HCL 10 MG PO TABS
5.0000 mg | ORAL_TABLET | Freq: Once | ORAL | Status: AC
  Administered 2015-08-05: 5 mg via ORAL

## 2015-08-05 MED ORDER — LIDOCAINE 5 % EX PTCH: 1.0000 | MEDICATED_PATCH | Freq: Two times a day (BID) | CUTANEOUS | 0 refills | Status: DC
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
## 2015-08-05 NOTE — Discharge Instructions (Signed)
Please seek medical attention for any high fevers, chest pain, shortness of breath, change in behavior, persistent vomiting, bloody stool or any other new or concerning symptoms.  
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
## 2015-08-12 ENCOUNTER — Other Ambulatory Visit: Payer: Medicaid Other

## 2015-08-14 ENCOUNTER — Other Ambulatory Visit: Payer: Medicaid Other

## 2015-08-28 ENCOUNTER — Encounter: Payer: Self-pay | Admitting: Anesthesiology

## 2015-08-28 ENCOUNTER — Encounter
Admission: RE | Admit: 2015-08-28 | Discharge: 2015-08-28 | Disposition: A | Discharge status: Home / Self Care | Payer: Medicaid Other | Source: Ambulatory Visit | Attending: Anesthesiology | Admitting: Anesthesiology

## 2015-08-28 NOTE — Progress Notes (Signed)
Anesthesiology consult note ( Ambulatory referral to OB anesthesia for morbid obesity) :      I had the distinct pleasure of meeting Susan Cantrell today for an OB anesthesiBonne Doloresa precheck.  Taraji has a Hx of morbid obesity with a BMI today of 49.6 which puts her at the borderline for BMI.  We discussed that in the next few weeks, she will need to watch her diet carefully and attempt some weight control with more nutritional choices along with water as the liquid of choice.  We discussed the possibility that if her weight balloons much above 50 BMI we may have to make arrangements for delivery at a major medical center.  Patient appears to understand.  Obviously, the [facility] and arrangements made if a marked change in BMI.  If we can stay at a BMI of 50 - 51, we can deliver here as she is fairly tall at 5' 8 and the weight is more evenly distributed.      The patient also has a history of chest pain last year that was evaluated by Dr. Lady GaryFath and was cleared with normal stress tests.

## 2015-09-03 ENCOUNTER — Encounter: Payer: Self-pay | Admitting: *Deleted

## 2015-09-03 ENCOUNTER — Inpatient Hospital Stay
Admission: EM | Admit: 2015-09-03 | Discharge: 2015-09-03 | Disposition: A | Discharge status: Home / Self Care | Payer: Medicaid Other | Attending: Obstetrics and Gynecology | Admitting: Obstetrics and Gynecology

## 2015-09-03 DIAGNOSIS — Z3A36 36 weeks gestation of pregnancy: Secondary | ICD-10-CM | POA: Diagnosis not present

## 2015-09-03 DIAGNOSIS — Z369 Encounter for antenatal screening, unspecified: Secondary | ICD-10-CM

## 2015-09-03 DIAGNOSIS — Z6841 Body Mass Index (BMI) 40.0 and over, adult: Secondary | ICD-10-CM | POA: Diagnosis not present

## 2015-09-03 DIAGNOSIS — O99213 Obesity complicating pregnancy, third trimester: Secondary | ICD-10-CM | POA: Insufficient documentation

## 2015-09-03 DIAGNOSIS — E669 Obesity, unspecified: Secondary | ICD-10-CM | POA: Diagnosis present

## 2015-09-03 NOTE — Discharge Instructions (Signed)
Drink plenty of fluid and get plenty of rest. Call your provider for any other concerns °

## 2015-09-03 NOTE — OB Triage Provider Note (Signed)
   Triage visit for NST    Susan Cantrell is a 25 y.o. G2P0010. She is at 6646w2d gestation. She presents for a scheduled NST.  Indication: BMI 50  S: Resting comfortably. no CTX, no VB. Active fetal movement.  O:  BP 137/78 (BP Location: Right Arm)   Pulse 85   Temp 98.3 F (36.8 C)   Resp 18   LMP 12/23/2014  No results found for this or any previous visit (from the past 48 hour(s)).   Gen: NAD, AAOx3      Abd: FNTTP      Ext: Non-tender, Nonedmeatous    FHT: 130, mod var, +accels no decels TOCO: quiet SVE:  deferred   A/P:  25 y.o. G2P0010 5346w2d with morbid obesity.    Reactive NST, with moderate variability and accelerations, no decels  Fetal Wellbeing: Reassuring  D/c home stable, precautions reviewed, follow-up as scheduled.

## 2015-09-03 NOTE — OB Triage Note (Signed)
G2P0 presents for scheduled NST for obesity.

## 2015-09-10 ENCOUNTER — Observation Stay
Admission: RE | Admit: 2015-09-10 | Discharge: 2015-09-10 | Disposition: A | Discharge status: Home / Self Care | Payer: Medicaid Other | Source: Ambulatory Visit | Attending: Obstetrics and Gynecology | Admitting: Obstetrics and Gynecology

## 2015-09-10 DIAGNOSIS — Z3490 Encounter for supervision of normal pregnancy, unspecified, unspecified trimester: Secondary | ICD-10-CM | POA: Diagnosis not present

## 2015-09-10 LAB — OB RESULTS CONSOLE GBS: STREP GROUP B AG: NEGATIVE

## 2015-09-16 ENCOUNTER — Inpatient Hospital Stay
Admission: EM | Admit: 2015-09-16 | Discharge: 2015-09-16 | Disposition: A | Discharge status: Home / Self Care | Payer: Medicaid Other | Admitting: Obstetrics and Gynecology

## 2015-09-16 ENCOUNTER — Encounter: Payer: Self-pay | Admitting: *Deleted

## 2015-09-16 DIAGNOSIS — Z369 Encounter for antenatal screening, unspecified: Secondary | ICD-10-CM

## 2015-09-16 HISTORY — DX: Other fracture of head and neck of left femur, subsequent encounter for closed fracture with routine healing: S72.092D

## 2015-09-16 HISTORY — DX: Fracture of coccyx, initial encounter for closed fracture: S32.2XXA

## 2015-09-16 LAB — OB RESULTS CONSOLE RPR: RPR: NONREACTIVE

## 2015-09-16 NOTE — OB Triage Note (Signed)
Pt arrived for scheduled Non Stress Test. Pt reports she has slept all morning and has not eaten or drunk fluids yet today

## 2015-09-16 NOTE — OB Triage Note (Signed)
Pt assessed by Nonda LouNM M Sigmon, and d/c allowed. CNM reviewed labor precautions, instructed pt to perform fetal kick counts,  nd drink adequate po fluids. Pt instructed by CNM to return to office today for Lab Draw, and make appt for next week with her, and NST. Pt to return if water breaks/leaks, regular u/c's occur, vaginal bleeding ensues, or for generalized feeling that something is not right. Pt and fob verbalizing understanding, no further questions

## 2015-09-16 NOTE — Progress Notes (Addendum)
TRIAGE VISIT with NST   Susan Cantrell is a 25 y.o. G2P0010. She is at 7751w3d gestation, presenting with signs of labor.  Indication: Obesity BMI 50  S: Resting comfortably. C/o mild cramping and an increase in pelvic pressure. No LOF,No VB. Active fetal movement. No concerns  O:  BP 113/62   Pulse 80   Temp 97.1 F (36.2 C) (Oral)   Resp 20   Ht 5\' 8"  (1.727 m)   LMP 12/23/2014  No results found for this or any previous visit (from the past 48 hour(s)).   Gen: NAD, AAOx3      Abd: FNTTP      Ext: Non-tender, Nonedmeatous    NST/FHT: Baseline: 135-140 bpm / moderate variability/ +15 x15 accels/ no decels  TOCO: quiet WUJ:[id number]:Dilation: 1 Effacement (%): 40 Exam by:: M Kyuss Hale CNM  NST: Reactive. See FHT above for particulars.  A/P:  25 y.o. G2P0010 7051w3d with scheduled NST for BMI 50.   FKC's daily  Labor precautions and warning s/s reviewed  Hydration and frequent small meals encouraged    Fetal Wellbeing: NST reactive Reassuring Cat 1 tracing.  D/c home stable, precautions reviewed, follow-up as scheduled.   BMI 50 - [facility]  Hx. Of Depression - no on Celexa  In[facility] her 36 week labs and to schedule an appointment for NST and ob appt.   Dr. Feliberto GottronSchermerhorn aware and agrees with plan of care   Susan Cantrell, CNM

## 2015-09-17 NOTE — Discharge Summary (Signed)
  nst on 09/10/15 , reactive d/c home

## 2015-09-22 ENCOUNTER — Inpatient Hospital Stay
Admission: EM | Admit: 2015-09-22 | Discharge: 2015-09-22 | Disposition: A | Discharge status: Home / Self Care | Payer: Medicaid Other | Attending: Obstetrics and Gynecology | Admitting: Obstetrics and Gynecology

## 2015-09-22 DIAGNOSIS — Z3A00 Weeks of gestation of pregnancy not specified: Secondary | ICD-10-CM | POA: Diagnosis not present

## 2015-09-22 DIAGNOSIS — O99213 Obesity complicating pregnancy, third trimester: Secondary | ICD-10-CM | POA: Diagnosis present

## 2015-09-22 NOTE — OB Triage Note (Signed)
Here for NST

## 2015-09-29 ENCOUNTER — Observation Stay
Admission: RE | Admit: 2015-09-29 | Discharge: 2015-09-29 | Disposition: A | Discharge status: Home / Self Care | Payer: Medicaid Other | Source: Intra-hospital | Attending: Obstetrics and Gynecology | Admitting: Obstetrics and Gynecology

## 2015-09-29 DIAGNOSIS — Z3A40 40 weeks gestation of pregnancy: Secondary | ICD-10-CM | POA: Insufficient documentation

## 2015-09-29 DIAGNOSIS — O288 Other abnormal findings on antenatal screening of mother: Secondary | ICD-10-CM

## 2015-09-29 HISTORY — DX: Other abnormal findings on antenatal screening of mother: O28.8

## 2015-09-29 NOTE — Plan of Care (Signed)
Reactive NST. Dr schermerhorn notified. Pt scheduled to come in for induction Friday night. Discussed induction process with pt. Pt verbalizes understanding. Pt d/c home with d/c instructions.

## 2015-09-29 NOTE — Plan of Care (Signed)
Pt here for scheduled NST for BMI>50

## 2015-09-29 NOTE — Discharge Summary (Signed)
NST for obesity  NST reactive

## 2015-10-04 ENCOUNTER — Inpatient Hospital Stay
Admission: EM | Admit: 2015-10-04 | Discharge: 2015-10-08 | DRG: 775 | Disposition: A | Discharge status: Home / Self Care | Payer: Medicaid Other | Attending: Obstetrics and Gynecology | Admitting: Obstetrics and Gynecology

## 2015-10-04 ENCOUNTER — Encounter: Payer: Self-pay | Admitting: *Deleted

## 2015-10-04 DIAGNOSIS — Z6841 Body Mass Index (BMI) 40.0 and over, adult: Secondary | ICD-10-CM

## 2015-10-04 DIAGNOSIS — Z87891 Personal history of nicotine dependence: Secondary | ICD-10-CM

## 2015-10-04 DIAGNOSIS — Z3A40 40 weeks gestation of pregnancy: Secondary | ICD-10-CM | POA: Diagnosis not present

## 2015-10-04 DIAGNOSIS — O99214 Obesity complicating childbirth: Principal | ICD-10-CM | POA: Diagnosis present

## 2015-10-04 DIAGNOSIS — Z369 Encounter for antenatal screening, unspecified: Secondary | ICD-10-CM

## 2015-10-04 LAB — CHLAMYDIA/NGC RT PCR (ARMC ONLY)
CHLAMYDIA TR: NOT DETECTED
N GONORRHOEAE: NOT DETECTED

## 2015-10-04 LAB — CBC
HEMATOCRIT: 35.1 % (ref 35.0–47.0)
HEMOGLOBIN: 12.2 g/dL (ref 12.0–16.0)
MCH: 29.4 pg (ref 26.0–34.0)
MCHC: 34.7 g/dL (ref 32.0–36.0)
MCV: 84.8 fL (ref 80.0–100.0)
Platelets: 358 10*3/uL (ref 150–440)
RBC: 4.14 MIL/uL (ref 3.80–5.20)
RDW: 14 % (ref 11.5–14.5)
WBC: 8.9 10*3/uL (ref 3.6–11.0)

## 2015-10-04 LAB — TYPE AND SCREEN
ABO/RH(D): A POS
ANTIBODY SCREEN: NEGATIVE

## 2015-10-04 LAB — OB RESULTS CONSOLE GC/CHLAMYDIA
CHLAMYDIA, DNA PROBE: NEGATIVE
Gonorrhea: NEGATIVE

## 2015-10-04 MED ORDER — ONDANSETRON HCL 4 MG/2ML IJ SOLN
4.0000 mg | Freq: Four times a day (QID) | INTRAMUSCULAR | Status: DC | PRN
  Administered 2015-10-04 – 2015-10-06 (×2): 4 mg via INTRAVENOUS
  Filled 2015-10-04 (×2): qty 2

## 2015-10-04 MED ORDER — MISOPROSTOL 200 MCG PO TABS
ORAL_TABLET | ORAL | Status: AC
  Filled 2015-10-04: qty 4

## 2015-10-04 MED ORDER — AMMONIA AROMATIC IN INHA
RESPIRATORY_TRACT | Status: AC
  Filled 2015-10-04: qty 10

## 2015-10-04 MED ORDER — SOD CITRATE-CITRIC ACID 500-334 MG/5ML PO SOLN
30.0000 mL | ORAL | Status: DC | PRN
  Filled 2015-10-04: qty 30

## 2015-10-04 MED ORDER — LIDOCAINE HCL (PF) 1 % IJ SOLN
INTRAMUSCULAR | Status: AC
  Administered 2015-10-06: 30 mL via SUBCUTANEOUS
  Filled 2015-10-04: qty 30

## 2015-10-04 MED ORDER — FENTANYL CITRATE (PF) 100 MCG/2ML IJ SOLN: 50.0000 ug | INTRAMUSCULAR | Status: DC | PRN

## 2015-10-04 MED ORDER — DINOPROSTONE 10 MG VA INST
10.0000 mg | VAGINAL_INSERT | Freq: Once | VAGINAL | Status: AC
  Administered 2015-10-04: 10 mg via VAGINAL
  Filled 2015-10-04 (×2): qty 1

## 2015-10-04 MED ORDER — ACETAMINOPHEN 325 MG PO TABS: 650.0000 mg | ORAL_TABLET | ORAL | Status: DC | PRN

## 2015-10-04 MED ORDER — LACTATED RINGERS IV SOLN
500.0000 mL | INTRAVENOUS | Status: DC | PRN
Start: 2015-10-04 — End: 2015-10-08

## 2015-10-04 MED ORDER — TERBUTALINE SULFATE 1 MG/ML IJ SOLN: 0.2500 mg | Freq: Once | INTRAMUSCULAR | Status: DC | PRN

## 2015-10-04 MED ORDER — LACTATED RINGERS IV SOLN
INTRAVENOUS | Status: DC
  Administered 2015-10-04 – 2015-10-05 (×5): via INTRAVENOUS

## 2015-10-04 MED ORDER — DINOPROSTONE 10 MG VA INST
10.0000 mg | VAGINAL_INSERT | Freq: Once | VAGINAL | Status: AC
  Administered 2015-10-05: 10 mg via VAGINAL
  Filled 2015-10-04: qty 1

## 2015-10-04 MED ORDER — FLEET ENEMA 7-19 GM/118ML RE ENEM: 1.0000 | ENEMA | RECTAL | Status: DC | PRN

## 2015-10-04 MED ORDER — OXYTOCIN BOLUS FROM INFUSION: 500.0000 mL | Freq: Once | INTRAVENOUS | Status: DC

## 2015-10-04 MED ORDER — OXYTOCIN 40 UNITS IN LACTATED RINGERS INFUSION - SIMPLE MED
2.5000 [IU]/h | INTRAVENOUS | Status: DC
  Filled 2015-10-04: qty 1000

## 2015-10-04 MED ORDER — LIDOCAINE HCL (PF) 1 % IJ SOLN
30.0000 mL | INTRAMUSCULAR | Status: AC | PRN
  Administered 2015-10-06: 30 mL via SUBCUTANEOUS

## 2015-10-04 MED ORDER — OXYTOCIN 10 UNIT/ML IJ SOLN
INTRAMUSCULAR | Status: AC
  Filled 2015-10-04: qty 2

## 2015-10-04 NOTE — Progress Notes (Signed)
Difficulty continuously monitoring FHT's due to maternal movement and size and active fetal movement. RN to bedside to adjust u/s.

## 2015-10-04 NOTE — Progress Notes (Signed)
S: Feeling more contractions, but tolerating well without pain medication.  Having some slight left hip pain.  Having some vaginal irritation from the Cervidil, but it's tolerable   O:  VS: Blood pressure 139/79, pulse 67, temperature 97.7 F (36.5 C), temperature source Oral, resp. rate 18, height 5\' 8"  (1.727 m), weight (!) 150.6 kg (332 lb), last menstrual period 12/17/2014.        FHR : baseline 130 bpm / variability moderate / accelerations + / no decelerations        Toco: contractions every 1-3 minutes / mild-moderate        Cervix : Dilation: 1.5 Effacement (%): 50 Station: -3         Membranes: intact  A: Latent labor     FHR category 1  P: Continue expectant management     Cervidil will be removed at 2308      Fentanyl IVP PRN    Susan Cantrell, CNM

## 2015-10-04 NOTE — Progress Notes (Signed)
Difficulty continuously monitoring FHT's due to very active fetal movement, maternal size, and maternal repositioning. RN to bedside to adjust U/S.
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
## 2015-10-04 NOTE — Progress Notes (Signed)
S:  Cervidil placed at 1108, doing well, feeling some left hip pain       Changed to wireless external monitors to allow freedom of movement   O:  VS: Blood pressure (!) 145/88, pulse 86, temperature 98.1 F (36.7 C), temperature source Oral, resp. rate 14, height 5\' 8"  (1.727 m), weight (!) 150.6 kg (332 lb), last menstrual period 12/17/2014.        FHR : baseline 125 bpm / variability moderate / accelerations + / no decelerations        Toco: irregular with UI        Cervix : Dilation: 1.5 Effacement (%): 50 Station: -3        Membranes: intact  A: Latent labor     FHR category 1  P: Continue expectant management     Dr. Karlton LemonKarenz, anesthesia, notified patient in house      Reassess in 1-2 hours  Carlean JewsMeredith Sigmon, CNM
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
## 2015-10-04 NOTE — Progress Notes (Signed)
S:  Cervidil out at 2308       Pt. Only feeling a few ctxs       Hungry and desires shower  O:  VS: Blood pressure (!) 138/54, pulse 66, temperature 98.2 F (36.8 C), temperature source Oral, resp. rate 18, height 5\' 8"  (1.727 m), weight (!) 150.6 kg (332 lb), last menstrual period 12/17/2014.        FHR : baseline 130 bpm / variability moderate / accelerations + / no decelerations        Toco: contractions every 1-3 minutes / mild-moderate         Cervix : Dilation: 1.5 (cervidil out) Effacement (%): 50 Cervical Position: Posterior Station: -3 Presentation: Vertex Exam by:: M. Halston Fairclough CNM        Membranes: Intact   A: Latent labor     FHR category 2  P: May eat a light meal      Pt. May shower     Replace Cervidil after shower     Ambien 5mg  QHS PRN for sleep      Re-evaluate in the morning  Susan Cantrell, CNM

## 2015-10-05 ENCOUNTER — Inpatient Hospital Stay: Payer: Medicaid Other | Admitting: Anesthesiology

## 2015-10-05 ENCOUNTER — Encounter: Payer: Self-pay | Admitting: Anesthesiology

## 2015-10-05 LAB — RPR: RPR Ser Ql: NONREACTIVE

## 2015-10-05 MED ORDER — NALOXONE HCL 2 MG/2ML IJ SOSY
1.0000 ug/kg/h | PREFILLED_SYRINGE | INTRAMUSCULAR | Status: DC | PRN
Start: 2015-10-05 — End: 2015-10-08
  Filled 2015-10-05: qty 2

## 2015-10-05 MED ORDER — NALBUPHINE HCL 10 MG/ML IJ SOLN: 5.0000 mg | INTRAMUSCULAR | Status: DC | PRN

## 2015-10-05 MED ORDER — FENTANYL 2.5 MCG/ML W/ROPIVACAINE 0.2% IN NS 100 ML EPIDURAL INFUSION (ARMC-ANES)
EPIDURAL | Status: AC
  Filled 2015-10-05: qty 100

## 2015-10-05 MED ORDER — LIDOCAINE-EPINEPHRINE (PF) 1.5 %-1:200000 IJ SOLN
INTRAMUSCULAR | Status: DC | PRN
  Administered 2015-10-05: 3 mL via EPIDURAL

## 2015-10-05 MED ORDER — EPHEDRINE SULFATE 50 MG/ML IJ SOLN
INTRAMUSCULAR | Status: AC
  Filled 2015-10-05: qty 1

## 2015-10-05 MED ORDER — SODIUM CHLORIDE 0.9% FLUSH: 3.0000 mL | INTRAVENOUS | Status: DC | PRN

## 2015-10-05 MED ORDER — LIDOCAINE HCL (PF) 1 % IJ SOLN
INTRAMUSCULAR | Status: DC | PRN
  Administered 2015-10-05: 1 mL via INTRADERMAL

## 2015-10-05 MED ORDER — FENTANYL 2.5 MCG/ML W/ROPIVACAINE 0.2% IN NS 100 ML EPIDURAL INFUSION (ARMC-ANES)
10.0000 mL/h | EPIDURAL | Status: DC
Start: 2015-10-05 — End: 2015-10-08
  Filled 2015-10-05: qty 100

## 2015-10-05 MED ORDER — SODIUM CHLORIDE FLUSH 0.9 % IV SOLN
INTRAVENOUS | Status: AC
  Filled 2015-10-05: qty 10

## 2015-10-05 MED ORDER — DIPHENHYDRAMINE HCL 50 MG/ML IJ SOLN: 12.5000 mg | INTRAMUSCULAR | Status: DC | PRN

## 2015-10-05 MED ORDER — SODIUM CHLORIDE FLUSH 0.9 % IV SOLN
INTRAVENOUS | Status: AC
  Administered 2015-10-05: 10 mL
  Filled 2015-10-05: qty 10

## 2015-10-05 MED ORDER — OXYTOCIN 40 UNITS IN LACTATED RINGERS INFUSION - SIMPLE MED
1.0000 m[IU]/min | INTRAVENOUS | Status: DC
  Administered 2015-10-05: 1 m[IU]/min via INTRAVENOUS
  Filled 2015-10-05: qty 1000

## 2015-10-05 MED ORDER — ZOLPIDEM TARTRATE 5 MG PO TABS
5.0000 mg | ORAL_TABLET | Freq: Every evening | ORAL | Status: DC | PRN
  Administered 2015-10-05: 5 mg via ORAL
  Filled 2015-10-05: qty 1

## 2015-10-05 MED ORDER — TERBUTALINE SULFATE 1 MG/ML IJ SOLN: 0.2500 mg | Freq: Once | INTRAMUSCULAR | Status: DC | PRN

## 2015-10-05 MED ORDER — PHENYLEPHRINE 40 MCG/ML (10ML) SYRINGE FOR IV PUSH (FOR BLOOD PRESSURE SUPPORT)
PREFILLED_SYRINGE | INTRAVENOUS | Status: AC
  Filled 2015-10-05: qty 10

## 2015-10-05 MED ORDER — FENTANYL 2.5 MCG/ML W/ROPIVACAINE 0.2% IN NS 100 ML EPIDURAL INFUSION (ARMC-ANES)
EPIDURAL | Status: DC | PRN
  Administered 2015-10-05: 10 mL/h via EPIDURAL
  Administered 2015-10-06: 250 ug via EPIDURAL

## 2015-10-05 MED ORDER — DIPHENHYDRAMINE HCL 25 MG PO CAPS: 25.0000 mg | ORAL_CAPSULE | ORAL | Status: DC | PRN

## 2015-10-05 MED ORDER — NALBUPHINE HCL 10 MG/ML IJ SOLN: 5.0000 mg | Freq: Once | INTRAMUSCULAR | Status: DC | PRN

## 2015-10-05 MED ORDER — NALOXONE HCL 0.4 MG/ML IJ SOLN: 0.4000 mg | INTRAMUSCULAR | Status: DC | PRN

## 2015-10-05 MED ORDER — SODIUM CHLORIDE 0.9 % IV SOLN
INTRAVENOUS | Status: DC | PRN
  Administered 2015-10-05 – 2015-10-06 (×5): 5 mL via EPIDURAL

## 2015-10-05 NOTE — Progress Notes (Signed)
S:  Attended bedside for repetitive late decelerations - related to spontaneous hypotension. >1 hour after epidural placement.  IVF bolus 500mL infusing. Pitocin off.  Anesthesia notified and at bedside.  Ephedrine ordered, but not given at this time. Left lateral position. FSE placed.   O:  VS: Blood pressure 103/60, pulse 70, temperature 97.7 F (36.5 C), temperature source Oral, resp. rate 18, height 5\' 8"  (1.727 m), weight (!) 150.6 kg (332 lb), last menstrual period 12/17/2014, SpO2 100 %.        FHR : baseline 135bpm / variability moderate / accelerations none / late decelerations        Toco: contractions every 1-4 minutes / moderate / MVUs inadequate        Cervix : Dilation: 5 Effacement (%): 70 Cervical Position: Posterior Station: -1 Presentation: Vertex Exam by:: M. Davielle Lingelbach CNM        Membranes: AROM -clear fluid  A: Latent labor     FHR category 2 - recovered to category 1 after interventions  P: Epidural off for 30 minutes per anesthesia     Will resume Pitocin when FHR recovers       Continue expectant management      Anticipate NSVD  Carlean JewsMeredith Judianne Seiple, CNM

## 2015-10-05 NOTE — Progress Notes (Signed)
S:  Very uncomfortable with contractions and requesting epidural   O:  VS: Blood pressure 127/75, pulse (!) 50, temperature 98 F (36.7 C), temperature source Oral, resp. rate 14, height 5\' 8"  (1.727 m), weight (!) 150.6 kg (332 lb), last menstrual period 12/17/2014.        FHR : baseline 145 bpm / variability moderate / accelerations + / no decelerations        Toco: contractions every 1-3 minutes / moderate / MVU 195        Cervix : Dilation: (P) 4 Effacement (%): 70 Cervical Position: (P) Posterior Station: -3, -2 Presentation: Vertex Exam by:: M.Cantrell CNM        Membranes: AROM - clear fluid   A: Latent labor     FHR category 2  P: Epidural now      Will place FSE once comfortable with epidural      Anticipate NSVD  Susan JewsMeredith Cantrell, CNM

## 2015-10-05 NOTE — Progress Notes (Signed)
S:  Pt. Still hurting, but not requiring pain medication       Difficulty tracing ctxs        O:  VS: Blood pressure 127/75, pulse (!) 50, temperature 98 F (36.7 C), temperature source Oral, resp. rate 14, height 5\' 8"  (1.727 m), weight (!) 150.6 kg (332 lb), last menstrual period 12/17/2014.        FHR : baseline 150 bpm / variability moderate / accelerations + / occasional variable decelerations        Toco: unable to determine         Cervix : Dilation: 3 Effacement (%): 70 Cervical Position: Posterior Station: -3, -2 Presentation: Vertex Exam by:: M.Jeneen Doutt CNM        Membranes: Intact  A: Latent labor     FHR category 2  P: Begin Pitocin 1 millunit and increase by 2 millunits       Will continue to assess for AROM      Anticipate NSVD  Susan Cantrell, CNM

## 2015-10-05 NOTE — Progress Notes (Signed)
S:  Pt. States she is still hurting with ctxs every 1-3 minutes, but unable to trace with monitor.      Discussed AROM with internal monitors, and patient agrees   O:  VS: Blood pressure 127/75, pulse (!) 50, temperature 98 F (36.7 C), temperature source Oral, resp. rate 14, height 5\' 8"  (1.727 m), weight (!) 150.6 kg (332 lb), last menstrual period 12/17/2014.        FHR : baseline 150 bpm / variability moderate / accelerations + / no decelerations        Toco: contractions every 1-3 minutes / moderate / MVU - 190        Cervix : Dilation: (P) 4 Effacement (%): 70 Cervical Position: (P) Posterior Station: -3, -2 Presentation: Vertex Exam by:: M.Adael Culbreath CNM        Membranes: AROM for clear fluid  A: Induction of Labor for Obesity       Latent labor     FHR category 2  P: IUPC placed      Continue Pitocin to achieve adequate MVUs     May have epidural PRN     Anticipate NSVD  Carlean JewsMeredith Dominique Ressel, CNM

## 2015-10-05 NOTE — Anesthesia Procedure Notes (Addendum)
Epidural Patient location during procedure: OB Start time: 10/05/2015 6:00 PM End time: 10/05/2015 6:08 PM  Staffing Anesthesiologist: Margorie JohnPISCITELLO, JOSEPH K Performed: anesthesiologist   Preanesthetic Checklist Completed: patient identified, site marked, surgical consent, pre-op evaluation, timeout performed, IV checked, risks and benefits discussed and monitors and equipment checked  Epidural Patient position: sitting Prep: Betadine Patient monitoring: heart rate, continuous pulse ox and blood pressure Approach: midline Location: L3-L4 Injection technique: LOR saline  Needle:  Needle type: Tuohy  Needle gauge: 17 G Needle length: 9 cm and 9 Needle insertion depth: 10 cm Catheter type: closed end flexible Catheter size: 19 Gauge Catheter at skin depth: 15 cm Test dose: negative and 1.5% lidocaine with Epi 1:200 K  Assessment Sensory level: T10 Events: blood not aspirated, injection not painful, no injection resistance, negative IV test and no paresthesia  Additional Notes Pt. Evaluated and documentation done after procedure finished. Patient identified. Risks/Benefits/Options discussed with patient including but not limited to bleeding, infection, nerve damage, paralysis, failed block, incomplete pain control, headache, blood pressure changes, nausea, vomiting, reactions to medication both or allergic, itching and postpartum back pain. Confirmed with bedside nurse the patient's most recent platelet count. Confirmed with patient that they are not currently taking any anticoagulation, have any bleeding history or any family history of bleeding disorders. Patient expressed understanding and wished to proceed. All questions were answered. Sterile technique was used throughout the entire procedure. Please see nursing notes for vital signs. Test dose was given through epidural catheter and negative prior to continuing to dose epidural or start infusion. Warning signs of high block given to  the patient including shortness of breath, tingling/numbness in hands, complete motor block, or any concerning symptoms with instructions to call for help. Patient was given instructions on fall risk and not to get out of bed. All questions and concerns addressed with instructions to call with any issues or inadequate analgesia.   Patient tolerated the insertion well without immediate complications.Reason for block:procedure for pain

## 2015-10-05 NOTE — Progress Notes (Signed)
Hospital Day 2 Induction  S:  Pt. Hurting more with ctxs, but not requiring pain medication at this time      Cervidil removed at 1235   O:  VS: Blood pressure 103/75, pulse 74, temperature 98.3 F (36.8 C), temperature source Oral, resp. rate 18, height 5\' 8"  (1.727 m), weight (!) 150.6 kg (332 lb), last menstrual period 12/17/2014.        FHR : baseline 135 bpm/ variability moderate / accelerations + / occasional early/late decelerations        Toco: contractions every 1-5 minutes / moderate         Cervix : Dilation: 3 Effacement (%): 70 Cervical Position: Posterior Station: -3, -2 Presentation: Vertex Exam by:: M.Cranford Blessinger CNM        Membranes: Intact  A: Latent labor     FHR category 2     GBS Negative  P: May shower again      Eat light laboring diet      Then will proceed to Pitocin begin at 1 miliunit and increase by 2 miliunits       Birth Plan reviewed       Anticipate NSVD  Susan Cantrell, CNM
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
## 2015-10-06 MED ORDER — WITCH HAZEL-GLYCERIN EX PADS: 1.0000 "application " | MEDICATED_PAD | CUTANEOUS | Status: DC | PRN

## 2015-10-06 MED ORDER — ONDANSETRON HCL 4 MG/2ML IJ SOLN: 4.0000 mg | INTRAMUSCULAR | Status: DC | PRN

## 2015-10-06 MED ORDER — ZOLPIDEM TARTRATE 5 MG PO TABS: 5.0000 mg | ORAL_TABLET | Freq: Every evening | ORAL | Status: DC | PRN

## 2015-10-06 MED ORDER — DIBUCAINE 1 % RE OINT: 1.0000 "application " | TOPICAL_OINTMENT | RECTAL | Status: DC | PRN

## 2015-10-06 MED ORDER — SENNOSIDES-DOCUSATE SODIUM 8.6-50 MG PO TABS: 2.0000 | ORAL_TABLET | ORAL | Status: DC

## 2015-10-06 MED ORDER — PRENATAL MULTIVITAMIN CH
1.0000 | ORAL_TABLET | Freq: Every day | ORAL | Status: DC
  Administered 2015-10-06 – 2015-10-08 (×3): 1 via ORAL
  Filled 2015-10-06 (×3): qty 1

## 2015-10-06 MED ORDER — ONDANSETRON HCL 4 MG PO TABS: 4.0000 mg | ORAL_TABLET | ORAL | Status: DC | PRN

## 2015-10-06 MED ORDER — COCONUT OIL OIL
1.0000 | TOPICAL_OIL | Status: DC | PRN
Start: 2015-10-06 — End: 2015-10-08
  Administered 2015-10-07: 1 via TOPICAL
  Filled 2015-10-06: qty 120

## 2015-10-06 MED ORDER — SIMETHICONE 80 MG PO CHEW: 80.0000 mg | CHEWABLE_TABLET | ORAL | Status: DC | PRN

## 2015-10-06 MED ORDER — DIPHENHYDRAMINE HCL 25 MG PO CAPS: 25.0000 mg | ORAL_CAPSULE | Freq: Four times a day (QID) | ORAL | Status: DC | PRN

## 2015-10-06 MED ORDER — BENZOCAINE-MENTHOL 20-0.5 % EX AERO
1.0000 "application " | INHALATION_SPRAY | CUTANEOUS | Status: DC | PRN
  Administered 2015-10-06: 1 via TOPICAL
  Filled 2015-10-06: qty 56

## 2015-10-06 MED ORDER — ACETAMINOPHEN 325 MG PO TABS: 650.0000 mg | ORAL_TABLET | ORAL | Status: DC | PRN

## 2015-10-06 MED ORDER — IBUPROFEN 600 MG PO TABS
600.0000 mg | ORAL_TABLET | Freq: Four times a day (QID) | ORAL | Status: DC
  Administered 2015-10-06 – 2015-10-08 (×8): 600 mg via ORAL
  Filled 2015-10-06 (×9): qty 1

## 2015-10-06 NOTE — Progress Notes (Signed)
S:  FHR tracing improved      Pt. Comfortable with epidural   O:  VS: Blood pressure 120/76, pulse 69, temperature 98.7 F (37.1 C), resp. rate 18, height 5\' 8"  (1.727 m), weight (!) 150.6 kg (332 lb), last menstrual period 12/17/2014, SpO2 99 %, unknown if currently breastfeeding.        FHR : baseline 135 bpm / variability moderate / accelerations + / occasional variable decelerations to nadir of 90 bpm        Toco: contractions every 1-3 minutes / moderate / MVU 140 bpm        Cervix : 5/70%/-1/vtx        Membranes: AROM - clear fluid   A: Latent labor     FHR category 2  P: Restart Pitocin to achieve adequate ctxs      Continue expectant management      Anticipate NSVD  Carlean JewsMeredith Sigmon, CNM
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
## 2015-10-07 LAB — CBC
HCT: 32.2 % — ABNORMAL LOW (ref 35.0–47.0)
HEMOGLOBIN: 10.9 g/dL — AB (ref 12.0–16.0)
MCH: 29.2 pg (ref 26.0–34.0)
MCHC: 34 g/dL (ref 32.0–36.0)
MCV: 85.9 fL (ref 80.0–100.0)
Platelets: 323 10*3/uL (ref 150–440)
RBC: 3.75 MIL/uL — AB (ref 3.80–5.20)
RDW: 14.1 % (ref 11.5–14.5)
WBC: 10.1 10*3/uL (ref 3.6–11.0)

## 2015-10-07 NOTE — Discharge Summary (Signed)
Obstetric Discharge Summary   Patient ID: Susan Cantrell MRN: [id number] DOB/AGE: 23/02/1990 23 y.o.   Date of Admission: 10/04/2015  Date of Discharge: 10/08/15  Admitting Diagnosis:IUP  at 4970w2d  Secondary Diagnosis: IOL for postdates  Mode of Delivery: NSVD of viable Girl     Discharge Diagnosis: NSVD of viable girl    Intrapartum Procedures:    Post partum procedures: FE  Complications: None   Brief Hospital Course  Yuleimy L Montez MoritaCarter is a G2P1011 who had a SVD on 2;  for further details of this delivery, please refer to the delivery note.  Patient had an uncomplicated postpartum course.  By time of discharge on PPD#2, her pain was controlled on oral pain medications; she had appropriate lochia and was ambulating, voiding without difficulty and tolerating regular diet.  She was deemed stable for discharge to home.     Labs: CBC Latest Ref Rng & Units 10/07/2015 10/04/2015 01/19/2014  WBC 3.6 - 11.0 K/uL 10.1 8.9 13.5(H)  Hemoglobin 12.0 - 16.0 g/dL 10.9(L) 12.2 11.8(L)  Hematocrit 35.0 - 47.0 % 32.2(L) 35.1 36.3  Platelets 150 - 440 K/uL 323 358 359   A POS  Physical exam:  Blood pressure 138/78, pulse 70, temperature 97.8 F (36.6 C), temperature source Oral, resp. rate 20, height 5\' 8"  (1.727 m), weight (!) 332 lb (150.6 kg), last menstrual period 12/17/2014, SpO2 100 %, unknown if currently breastfeeding. General: alert and no distress Heart: S1S2, RRR, No M/R/G. Lungs: CTA bilat, no W/R/R. Lochia: appropriate Abdomen: soft, NT Uterine Fundus: firm Extremities: No evidence of DVT seen on physical exam. No lower extremity edema.  Discharge Instructions: Per After Visit Summary. Activity: Advance as tolerated. Pelvic rest for 6 weeks.  Also refer to After Visit Summary Diet: Regular Medications: PNV, FE Outpatient follow up: 2 weeks Postpartum contraception:unsure Discharged Condition: stable  Will restart Celexa as mom is crying and upset abour crying baby and  feel she is exhibiting early signs of depression  Discharged to: Home   Newborn Data:  Baby "Girl" named "[place]"  Disposition:Baby needs to stay for Bili light so mom will be discharged but, will be allowed to stay in overnight bed  Apgars: APGAR (1 MIN): 9   APGAR (5 MINS): 9   APGAR (10 MINS):    Baby Feeding: Breast  Sharee Pimplearon W Gasper Hopes, CNM 10/07/2015
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
## 2015-10-07 NOTE — Anesthesia Postprocedure Evaluation (Signed)
Anesthesia Post Note  Patient: Susan Cantrell  Procedure(s) Performed: * No procedures listed *  Patient location during evaluation: Mother Baby Anesthesia Type: Epidural Level of consciousness: awake, awake and alert, oriented and patient cooperative Pain management: satisfactory to patient Vital Signs Assessment: post-procedure vital signs reviewed and stable Respiratory status: spontaneous breathing, nonlabored ventilation and respiratory function stable Cardiovascular status: stable Postop Assessment: no headache, patient able to bend at knees, no signs of nausea or vomiting and adequate PO intake Anesthetic complications: no    Last Vitals:  Vitals:   10/06/15 1941 10/07/15 0000  BP: 112/73 (!) 107/58  Pulse: 71 70  Resp: 18 18  Temp: 36.7 C 36.8 C    Last Pain:  Vitals:   10/07/15 0400  TempSrc:   PainSc: 0-No pain                 Marlana SalvageSandra Babetta Paterson

## 2015-10-08 MED ORDER — INFLUENZA VAC SPLIT QUAD 0.5 ML IM SUSY: 0.5000 mL | PREFILLED_SYRINGE | INTRAMUSCULAR | Status: DC

## 2015-10-08 NOTE — Discharge Instructions (Signed)
Follow up sooner with fever, problems breathing, pain not helped by medications, severe depression( more than just baby blues, wanting to hurt yourself or the baby), severe bleeding ( saturating more than one pad an hour or large palm sized clots), no heavy lifting , no driving while taking narcotics, no douches, intercourse, tampons or enemas for 6 weeks Care After Vaginal Delivery Congratulations on your new baby!!  Refer to this sheet in the next few weeks. These discharge instructions provide you with information on caring for yourself after delivery. Your caregiver may also give you specific instructions. Your treatment has been planned according to the most current medical practices available, but problems sometimes occur. Call your caregiver if you have any problems or questions after you go home.  HOME CARE INSTRUCTIONS  Take over-the-counter or prescription medicines only as directed by your caregiver or pharmacist.  Do not drink alcohol, especially if you are breastfeeding or taking medicine to relieve pain.  Do not chew or smoke tobacco.  Do not use illegal drugs.  Continue to use good perineal care. Good perineal care includes:  Wiping your perineum from front to back.  Keeping your perineum clean.  Do not use tampons or douche until your caregiver says it is okay.  Shower, wash your hair, and take tub baths as directed by your caregiver.  Wear a well-fitting bra that provides breast support.  Eat healthy foods.  Drink enough fluids to keep your urine clear or pale yellow.  Eat high-fiber foods such as whole grain cereals and breads, brown rice, beans, and fresh fruits and vegetables every day. These foods may help prevent or relieve constipation.  Follow your caregiver's recommendations regarding resumption of activities such as climbing stairs, driving, lifting, exercising, or traveling. Specifically, no driving for two weeks, so that you are comfortable reacting  quickly in an emergency.  Talk to your caregiver about resuming sexual activities. Resumption of sexual activities is dependent upon your risk of infection, your rate of healing, and your comfort and desire to resume sexual activity. Usually we recommend waiting about six weeks, or until your bleeding stops and you are interested in sex.  Try to have someone help you with your household activities and your newborn for at least a few days after you leave the hospital. Even longer is better.  Rest as much as possible. Try to rest or take a nap when your newborn is sleeping. Sleep deprivation can be very hard after delivery.  Increase your activities gradually.  Keep all of your scheduled postpartum appointments. It is very important to keep your scheduled follow-up appointments. At these appointments, your caregiver will be checking to make sure that you are healing physically and emotionally.  SEEK MEDICAL CARE IF:   You are passing large clots from your vagina.   You have a foul smelling discharge from your vagina.  You have trouble urinating.  You are urinating frequently.  You have pain when you urinate.  You have a change in your bowel movements.  You have increasing redness, pain, or swelling near your vaginal incision (episiotomy) or vaginal tear.  You have pus draining from your episiotomy or vaginal tear.  Your episiotomy or vaginal tear is separating.  You have painful, hard, or reddened breasts.  You have a severe headache.  You have blurred vision or see spots.  You feel sad or depressed.  You have thoughts of hurting yourself or your newborn.  You have questions about your care, the care of  your newborn, or medicines. °· You are dizzy or light-headed. °· You have a rash. °· You have nausea or vomiting. °· You were breastfeeding and have not had a menstrual period within 12 weeks after you stopped breastfeeding. °· You are not breastfeeding and have not had a  menstrual period by the 12th week after delivery. °· You have a fever. ° °SEEK IMMEDIATE MEDICAL CARE IF:  °· You have persistent pain. °· You have chest pain. °· You have shortness of breath. °· You faint. °· You have leg pain. °· You have stomach pain. °· Your vaginal bleeding saturates two or more sanitary pads in 1 hour. ° °MAKE SURE YOU:  °· Understand these instructions. °· Will get help right away if you are not doing well or get worse. °·  °Document Released: 12/26/1999 Document Revised: 05/14/2013 Document Reviewed: 08/25/2011 ° °Exit[employer]® Patient Information ©2015 [employer]. This information is not intended to replace advice given to you by your health care provider. Make sure you discuss any questions you have with your health care provider. ° °

## 2015-10-08 NOTE — Progress Notes (Signed)
All discharge instructions given to patient and she voiced understanding of all instructions given. Prescriptions given to mom by md. Patient discharged but will remain in room and baby become a pediatric patient.

## 2016-07-14 IMAGING — US US EXTREM LOW VENOUS*R*
1 series · 13 of 24 positions shown · non-contrast
Comparison: None.

CLINICAL DATA: Right calf pain in pregnant patient. Evaluate for
DVT.



[Series 1: us extrem low venous*right* · 0.11mm/px · 13 of 32 slices shown]
[im 1/32]
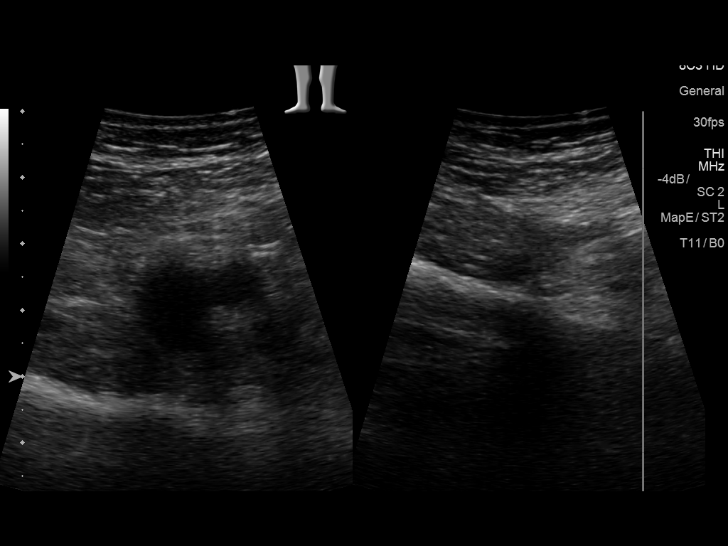
[im 3/32]
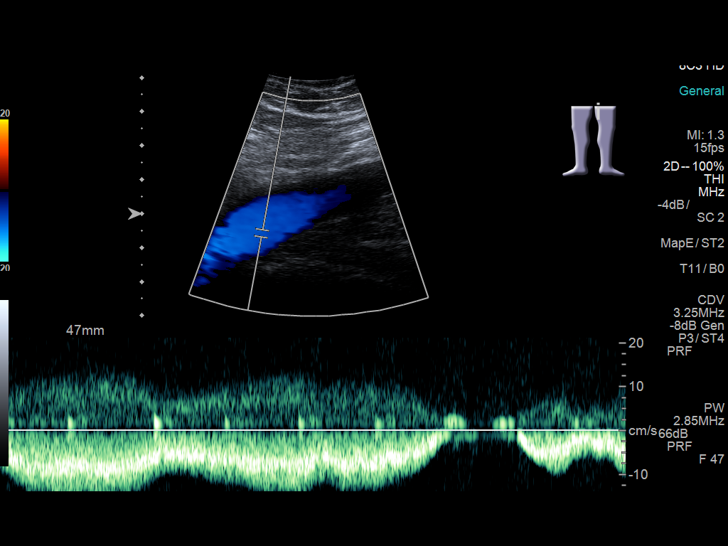
[im 6/32]
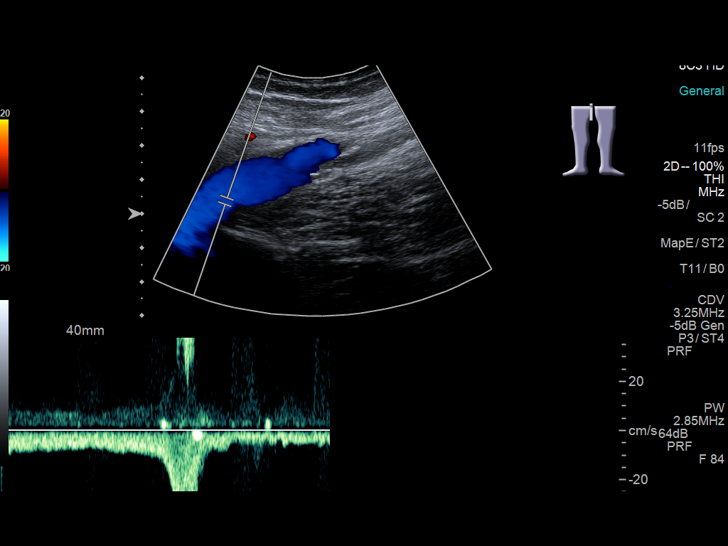
[im 9/32]
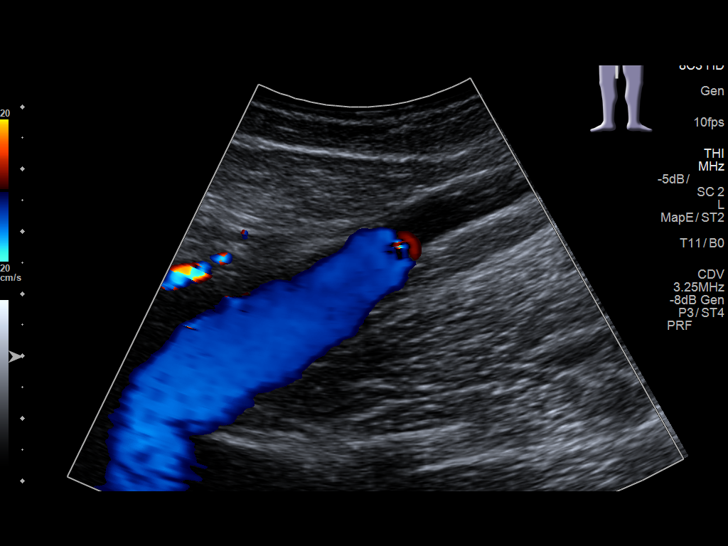
[im 11/32]
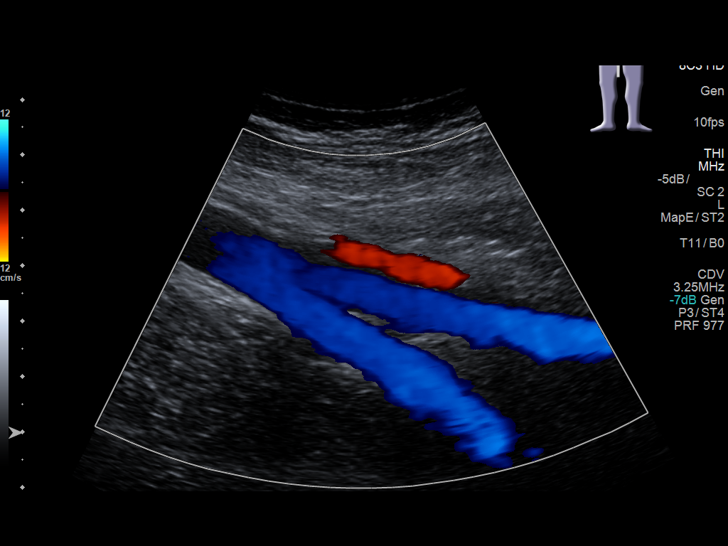
[im 14/32]
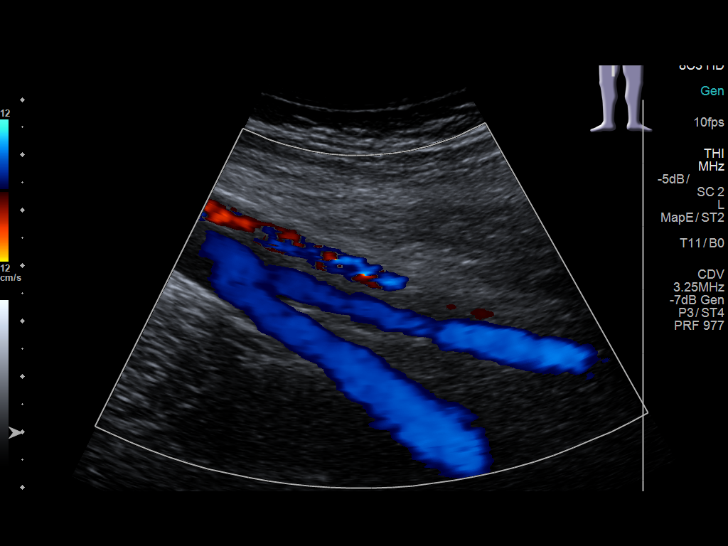
[im 17/32]
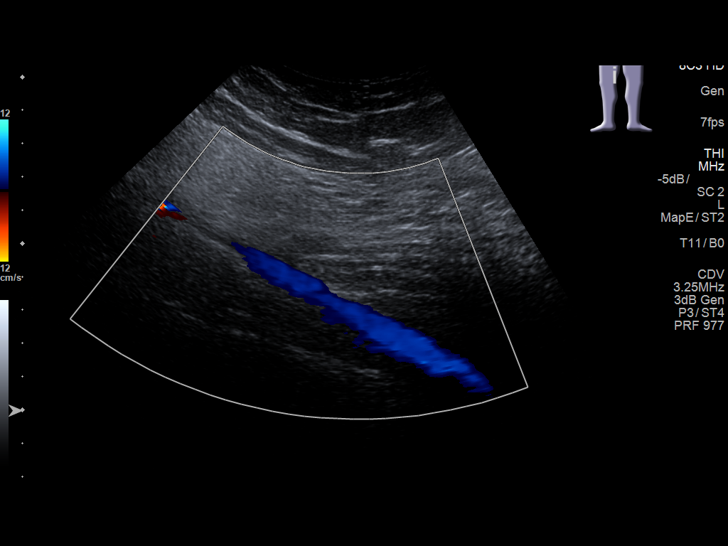
[im 18/32]
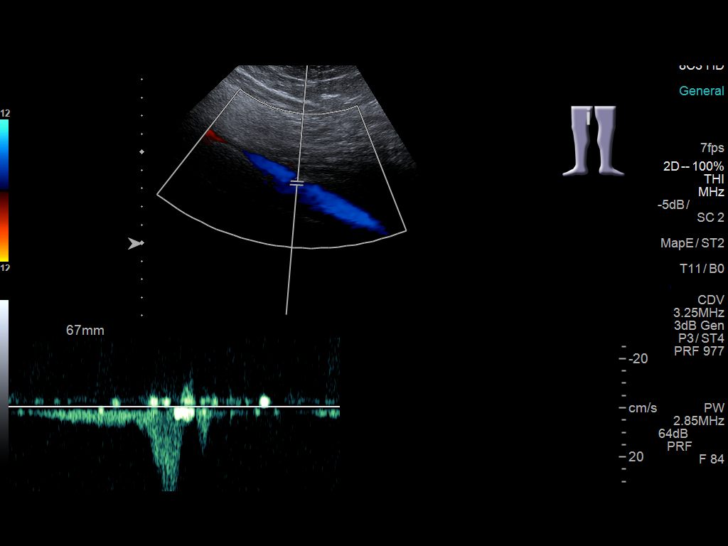
[im 21/32]
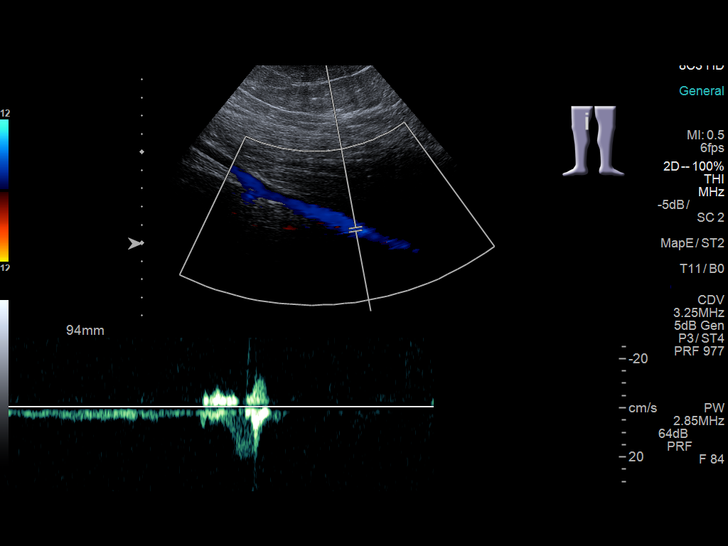
[im 23/32]
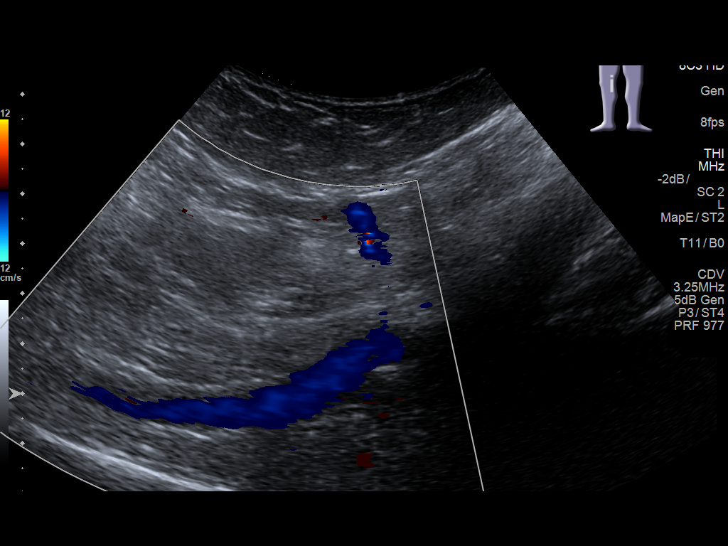
[im 26/32]
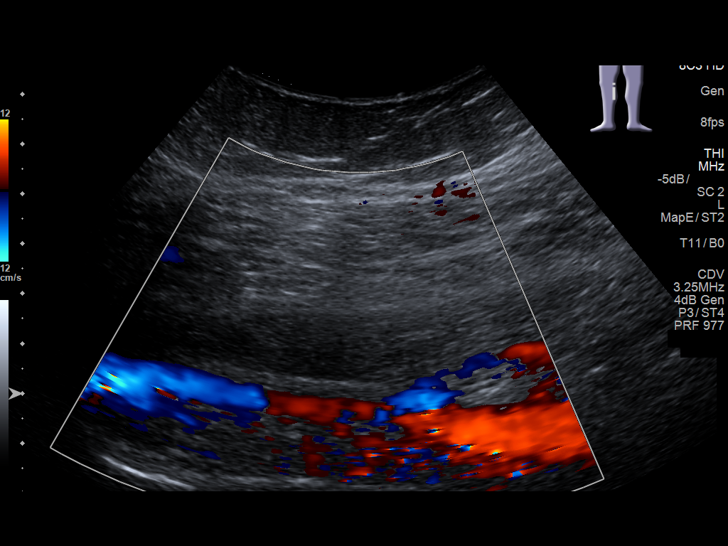
[im 29/32]
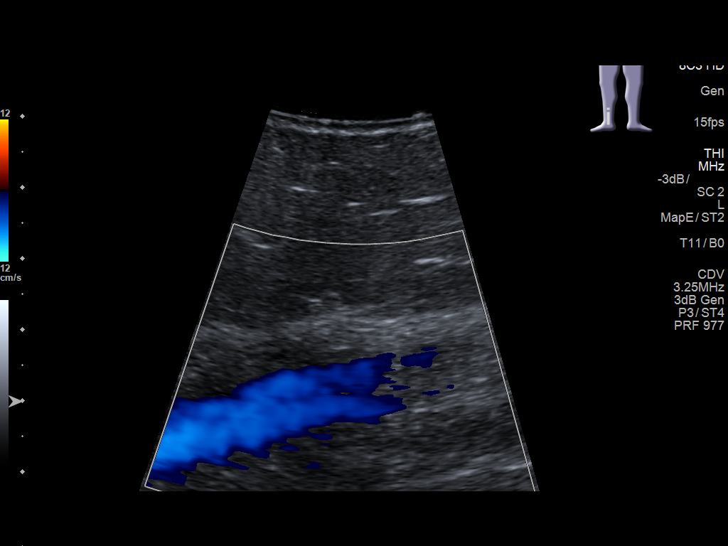
[im 32/32]
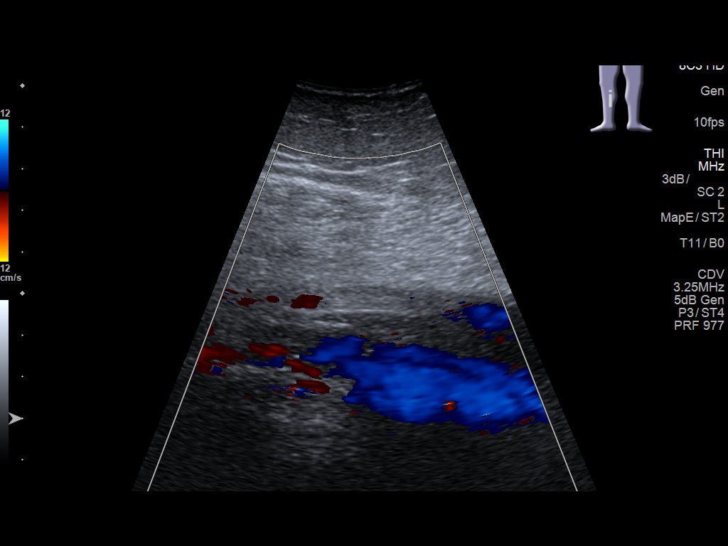

[13 of 24 positions shown; findings below may reference images not displayed]

FINDINGS: Contralateral Common Femoral Vein: Respiratory phasicity is normal
and symmetric with the symptomatic side. No evidence of thrombus.
Normal compressibility.

Common Femoral Vein: No evidence of thrombus. Normal
compressibility, respiratory phasicity and response to augmentation.

Saphenofemoral Junction: No evidence of thrombus. Normal
compressibility and flow on color Doppler imaging.

Profunda Femoral Vein: No evidence of thrombus. Normal
compressibility and flow on color Doppler imaging.

Femoral Vein: No evidence of thrombus. Normal compressibility,
respiratory phasicity and response to augmentation.

Popliteal Vein: No evidence of thrombus. Normal compressibility,
respiratory phasicity and response to augmentation.

Calf Veins: No evidence of thrombus. Normal compressibility and flow
on color Doppler imaging.

Superficial Great Saphenous Vein: No evidence of thrombus. Normal
compressibility and flow on color Doppler imaging.

Venous Reflux:  None.

Other Findings:  None.
IMPRESSION: No evidence DVT within the right lower extremity.

## 2016-09-14 ENCOUNTER — Ambulatory Visit: Payer: Self-pay

## 2016-09-14 ENCOUNTER — Ambulatory Visit (INDEPENDENT_AMBULATORY_CARE_PROVIDER_SITE_OTHER): Payer: BLUE CROSS/BLUE SHIELD

## 2016-09-14 ENCOUNTER — Encounter: Payer: Self-pay | Admitting: Podiatry

## 2016-09-14 ENCOUNTER — Ambulatory Visit (INDEPENDENT_AMBULATORY_CARE_PROVIDER_SITE_OTHER): Payer: BLUE CROSS/BLUE SHIELD | Admitting: Podiatry

## 2016-09-14 DIAGNOSIS — M659 Synovitis and tenosynovitis, unspecified: Secondary | ICD-10-CM | POA: Diagnosis not present

## 2016-09-14 DIAGNOSIS — M25572 Pain in left ankle and joints of left foot: Secondary | ICD-10-CM

## 2016-09-14 DIAGNOSIS — M25571 Pain in right ankle and joints of right foot: Secondary | ICD-10-CM

## 2016-09-14 MED ORDER — DICLOFENAC SODIUM 75 MG PO TBEC: 75.0000 mg | DELAYED_RELEASE_TABLET | Freq: Two times a day (BID) | ORAL | 0 refills | Status: DC

## 2016-09-17 NOTE — Progress Notes (Signed)
   Subjective:  Patient presents today for pain and tenderness to the bilateral ankles that began about three weeks ago. Patient relates significant pain and tenderness when walking. Patient presents for further treatment and evaluation.   Objective / Physical Exam:  General:  The patient is alert and oriented x3 in no acute distress. Dermatology:  Skin is warm, dry and supple bilateral lower extremities. Negative for open lesions or macerations. Vascular:  Palpable pedal pulses bilaterally. No edema or erythema noted. Capillary refill within normal limits. Neurological:  Epicritic and protective threshold grossly intact bilaterally.  Musculoskeletal Exam:  Pain on palpation to the anterior aspect of the patient's bilateral ankles. Mild edema noted.  Range of motion within normal limits to all pedal and ankle joints bilateral. Muscle strength 5/5 in all groups bilateral.   Radiographic Exam:  Normal osseous mineralization. Joint spaces preserved. No fracture/dislocation/boney destruction.    Assessment: #1 pain in bilateral ankles  #2 synovitis bilateral ankles-anterior aspect    Plan of Care:  #1 Patient was evaluated. X-Rays reviewed.  #2 injection of 0.5 mL Celestone Soluspan injected in the patient's bilateral ankle joints. #3 prescription for Diclofenac given to patient. #4 ankle brace(s) dispensed  #5 patient is to return to clinic in 4 weeks   Felecia ShellingBrent M. Cliff Damiani, DPM [facility]  Dr. Felecia ShellingBrent M. Kelby Adell, DPM    [address]                                        [place], [place] [address]                Office [phone]  Fax [phone]

## 2016-09-20 MED ORDER — BETAMETHASONE SOD PHOS & ACET 6 (3-3) MG/ML IJ SUSP
3.0000 mg | Freq: Once | INTRAMUSCULAR | Status: DC
Start: 2016-09-20 — End: 2019-03-24

## 2016-10-12 ENCOUNTER — Encounter: Payer: Self-pay | Admitting: Podiatry

## 2016-10-12 ENCOUNTER — Ambulatory Visit (INDEPENDENT_AMBULATORY_CARE_PROVIDER_SITE_OTHER): Payer: BLUE CROSS/BLUE SHIELD | Admitting: Podiatry

## 2016-10-12 DIAGNOSIS — M659 Synovitis and tenosynovitis, unspecified: Secondary | ICD-10-CM | POA: Diagnosis not present

## 2016-10-13 NOTE — Progress Notes (Signed)
   Subjective:  Patient presents today for follow-up evaluation of pain and tenderness to the bilateral ankles. She states her pain is improving. He injection and wearing the ankle braces help alleviate the pain. There are no other modifying factors noted. She denies any new complaints at this time. Patient presents for further treatment and evaluation.   Past Medical History:  Diagnosis Date  . Closed impacted fracture of left hip with routine healing 2014   no sx required  . Fracture of coccyx, initial encounter for closed fracture (HCC) 2014   no sx required  . Pregnancy    [facility] 09/29/2015      Objective / Physical Exam:  General:  The patient is alert and oriented x3 in no acute distress. Dermatology:  Skin is warm, dry and supple bilateral lower extremities. Negative for open lesions or macerations. Vascular:  Palpable pedal pulses bilaterally. No edema or erythema noted. Capillary refill within normal limits. Neurological:  Epicritic and protective threshold grossly intact bilaterally.  Musculoskeletal Exam:  Pain on palpation to the anterior aspect of the patient's bilateral ankles. Mild edema noted.  Range of motion within normal limits to all pedal and ankle joints bilateral. Muscle strength 5/5 in all groups bilateral.    Assessment: #1 pain in bilateral ankles  #2 synovitis bilateral ankles   Plan of Care:  #1 Patient was evaluated. #2 injection of 0.5 mL Celestone Soluspan injected in the patient's bilateral ankle joints. #3 continue wearing ankle braces and taking diclofenac when necessary. #4 return to clinic when necessary.   Felecia Shelling, DPM [facility]  Dr. Felecia Shelling, DPM    [address]                                        [place], [place] [address]                Office [phone]  Fax [phone]

## 2016-10-20 MED ORDER — BETAMETHASONE SOD PHOS & ACET 6 (3-3) MG/ML IJ SUSP: 3.0000 mg | Freq: Once | INTRAMUSCULAR | Status: DC

## 2017-01-03 ENCOUNTER — Emergency Department: Payer: Medicaid Other

## 2017-01-03 ENCOUNTER — Other Ambulatory Visit: Payer: Self-pay

## 2017-01-03 ENCOUNTER — Emergency Department
Admission: EM | Admit: 2017-01-03 | Discharge: 2017-01-03 | Disposition: A | Discharge status: Home / Self Care | Payer: Medicaid Other | Attending: Emergency Medicine | Admitting: Emergency Medicine

## 2017-01-03 ENCOUNTER — Encounter: Payer: Self-pay | Admitting: Emergency Medicine

## 2017-01-03 DIAGNOSIS — Z87891 Personal history of nicotine dependence: Secondary | ICD-10-CM | POA: Diagnosis not present

## 2017-01-03 DIAGNOSIS — J189 Pneumonia, unspecified organism: Secondary | ICD-10-CM | POA: Diagnosis not present

## 2017-01-03 DIAGNOSIS — R1110 Vomiting, unspecified: Secondary | ICD-10-CM | POA: Diagnosis present

## 2017-01-03 DIAGNOSIS — Z79899 Other long term (current) drug therapy: Secondary | ICD-10-CM | POA: Diagnosis not present

## 2017-01-03 LAB — URINALYSIS, COMPLETE (UACMP) WITH MICROSCOPIC
BILIRUBIN URINE: NEGATIVE
Glucose, UA: NEGATIVE mg/dL
KETONES UR: NEGATIVE mg/dL
Nitrite: NEGATIVE
PROTEIN: NEGATIVE mg/dL
Specific Gravity, Urine: 1.021 (ref 1.005–1.030)
pH: 6 (ref 5.0–8.0)

## 2017-01-03 LAB — COMPREHENSIVE METABOLIC PANEL
ALT: 17 U/L (ref 14–54)
AST: 21 U/L (ref 15–41)
Albumin: 3.9 g/dL (ref 3.5–5.0)
Alkaline Phosphatase: 105 U/L (ref 38–126)
Anion gap: 10 (ref 5–15)
BUN: 8 mg/dL (ref 6–20)
CHLORIDE: 103 mmol/L (ref 101–111)
CO2: 22 mmol/L (ref 22–32)
Calcium: 9.2 mg/dL (ref 8.9–10.3)
Creatinine, Ser: 0.66 mg/dL (ref 0.44–1.00)
Glucose, Bld: 114 mg/dL — ABNORMAL HIGH (ref 65–99)
Potassium: 4.1 mmol/L (ref 3.5–5.1)
SODIUM: 135 mmol/L (ref 135–145)
Total Bilirubin: 0.7 mg/dL (ref 0.3–1.2)
Total Protein: 8.1 g/dL (ref 6.5–8.1)

## 2017-01-03 LAB — CBC
HEMATOCRIT: 42.6 % (ref 35.0–47.0)
Hemoglobin: 14.1 g/dL (ref 12.0–16.0)
MCH: 28.5 pg (ref 26.0–34.0)
MCHC: 33.1 g/dL (ref 32.0–36.0)
MCV: 86.2 fL (ref 80.0–100.0)
Platelets: 450 10*3/uL — ABNORMAL HIGH (ref 150–440)
RBC: 4.94 MIL/uL (ref 3.80–5.20)
RDW: 13.5 % (ref 11.5–14.5)
WBC: 17 10*3/uL — AB (ref 3.6–11.0)

## 2017-01-03 LAB — LIPASE, BLOOD: LIPASE: 19 U/L (ref 11–51)

## 2017-01-03 LAB — POCT PREGNANCY, URINE: PREG TEST UR: NEGATIVE

## 2017-01-03 LAB — INFLUENZA PANEL BY PCR (TYPE A & B)
Influenza A By PCR: NEGATIVE
Influenza B By PCR: NEGATIVE

## 2017-01-03 LAB — LACTIC ACID, PLASMA: Lactic Acid, Venous: 1.1 mmol/L (ref 0.5–1.9)

## 2017-01-03 MED ORDER — KETOROLAC TROMETHAMINE 30 MG/ML IJ SOLN
15.0000 mg | Freq: Once | INTRAMUSCULAR | Status: AC
  Administered 2017-01-03: 15 mg via INTRAVENOUS
  Filled 2017-01-03: qty 1

## 2017-01-03 MED ORDER — AMOXICILLIN-POT CLAVULANATE 875-125 MG PO TABS: 1.0000 | ORAL_TABLET | Freq: Two times a day (BID) | ORAL | 0 refills | Status: AC

## 2017-01-03 MED ORDER — ONDANSETRON HCL 4 MG/2ML IJ SOLN
4.0000 mg | Freq: Once | INTRAMUSCULAR | Status: AC
  Administered 2017-01-03: 4 mg via INTRAVENOUS
  Filled 2017-01-03: qty 2

## 2017-01-03 MED ORDER — DOXYCYCLINE HYCLATE 100 MG PO CAPS: 100.0000 mg | ORAL_CAPSULE | Freq: Two times a day (BID) | ORAL | 0 refills | Status: AC

## 2017-01-03 MED ORDER — DOXYCYCLINE HYCLATE 100 MG PO TABS
100.0000 mg | ORAL_TABLET | Freq: Once | ORAL | Status: AC
  Administered 2017-01-03: 100 mg via ORAL
  Filled 2017-01-03: qty 1

## 2017-01-03 MED ORDER — SODIUM CHLORIDE 0.9 % IV BOLUS (SEPSIS)
1000.0000 mL | Freq: Once | INTRAVENOUS | Status: AC
  Administered 2017-01-03: 1000 mL via INTRAVENOUS

## 2017-01-03 MED ORDER — CEFTRIAXONE SODIUM IN DEXTROSE 20 MG/ML IV SOLN
1.0000 g | Freq: Once | INTRAVENOUS | Status: AC
  Administered 2017-01-03: 1 g via INTRAVENOUS
  Filled 2017-01-03: qty 50

## 2017-01-03 NOTE — ED Provider Notes (Signed)
[facility] Emergency Department Provider Note  ____________________________________________  Time seen: Approximately 2:51 PM  I have reviewed the triage vital signs and the nursing notes.   HISTORY  Chief Complaint No chief complaint on file.   HPI Susan Cantrell is a 26 y.o. female no significant past medical history who presents for evaluation of flulike symptoms. Patient reports that her symptoms started yesterday. 3 episodes of nonbloody nonbilious emesis and 3 of diarrhea, she has sore throat, body aches, nausea, chills, cough. no chest pain or shortness of breath, no abdominal pain, no dysuria or hematuria. Patient has never received a flu shot. Her symptoms have been constant and moderate since yesterday. She reports that she recently visited her sister and several members of her household was sick with similar symptoms.  Past Medical History:  Diagnosis Date  . Closed impacted fracture of left hip with routine healing 2014   no sx required  . Fracture of coccyx, initial encounter for closed fracture (HCC) 2014   no sx required  . Pregnancy    [facility] 09/29/2015    Patient Active Problem List   Diagnosis Date Noted  . Postpartum care following vaginal delivery 10/06/2015  . Obesity, morbid, BMI 50 or higher (HCC) 10/06/2015  . Labor and delivery, indication for care 10/04/2015  . Non-reactive NST (non-stress test) 09/29/2015  . Indication for care in labor and delivery, antepartum 09/10/2015  . First trimester screening 03/24/2015    Past Surgical History:  Procedure Laterality Date  . TYMPANOSTOMY TUBE PLACEMENT      Prior to Admission medications   Medication Sig Start Date End Date Taking? Authorizing Provider  NECON 0.5/35, 28, 0.5-35 MG-MCG tablet Take 1 tablet by mouth daily. 12/06/16  Yes [provider]  amoxicillin-clavulanate (AUGMENTIN) 875-125 MG tablet Take 1 tablet by mouth 2 (two) times daily for 10 days. 01/03/17  01/13/17  Nita Sickle, MD  diclofenac (VOLTAREN) 75 MG EC tablet Take 1 tablet (75 mg total) by mouth 2 (two) times daily. Patient not taking: Reported on 01/03/2017 09/14/16   Felecia Shelling, DPM  doxycycline (VIBRAMYCIN) 100 MG capsule Take 1 capsule (100 mg total) by mouth 2 (two) times daily for 10 days. 01/03/17 01/13/17  Nita Sickle, MD    Allergies Oxycodone  Family History  Problem Relation Age of Onset  . Asthma Brother   . Cancer Maternal Grandmother   . Diabetes Maternal Grandmother     Social History Social History   Tobacco Use  . Smoking status: Former Smoker    Packs/day: 0.25    Years: 0.50    Pack years: 0.12    Types: Cigarettes    Last attempt to quit: 12/12/2014    Years since quitting: 2.0  . Smokeless tobacco: Never Used  Substance Use Topics  . Alcohol use: No    Alcohol/week: 0.0 oz  . Drug use: No    Review of Systems  Constitutional: + fever, body aches Eyes: Negative for visual changes. ENT: + sore throat. Neck: No neck pain  Cardiovascular: Negative for chest pain. Respiratory: Negative for shortness of breath. + cough Gastrointestinal: Negative for abdominal pain. + nausea, vomiting and diarrhea. Genitourinary: Negative for dysuria. Musculoskeletal: Negative for back pain. Skin: Negative for rash. Neurological: Negative for headaches, weakness or numbness. Psych: No SI or HI  ____________________________________________   PHYSICAL EXAM:  VITAL SIGNS: ED Triage Vitals  Enc Vitals Group     BP 01/03/17 1143 122/89  Pulse Rate 01/03/17 1141 (!) 115     Resp 01/03/17 1141 16     Temp 01/03/17 1141 (!) 100.9 F (38.3 C)     Temp Source 01/03/17 1141 Oral     SpO2 01/03/17 1141 97 %     Weight 01/03/17 1141 (!) 315 lb (142.9 kg)     Height 01/03/17 1141 5\' 8"  (1.727 m)     Head Circumference --      Peak Flow --      Pain Score 01/03/17 1140 0     Pain Loc --      Pain Edu? --      Excl. in GC? --      Constitutional: Alert and oriented. Well appearing and in no apparent distress. HEENT:      Head: Normocephalic and atraumatic.         Eyes: Conjunctivae are normal. Sclera is non-icteric.       Mouth/Throat: Mucous membranes are moist. oropharynx is clear      Neck: Supple with no signs of meningismus. Cardiovascular: tachycardic with regular rhythm. No murmurs, gallops, or rubs. 2+ symmetrical distal pulses are present in all extremities. No JVD. Respiratory: Normal respiratory effort. Lungs are clear to auscultation bilaterally. No wheezes, crackles, or rhonchi.  Gastrointestinal: Soft, non tender, and non distended with positive bowel sounds. No rebound or guarding. Musculoskeletal: Nontender with normal range of motion in all extremities. No edema, cyanosis, or erythema of extremities. Neurologic: Normal speech and language. Face is symmetric. Moving all extremities. No gross focal neurologic deficits are appreciated. Skin: Skin is warm, dry and intact. No rash noted. Psychiatric: Mood and affect are normal. Speech and behavior are normal.  ____________________________________________   LABS (all labs ordered are listed, but only abnormal results are displayed)  Labs Reviewed  COMPREHENSIVE METABOLIC PANEL - Abnormal; Notable for the following components:      Result Value   Glucose, Bld 114 (*)    All other components within normal limits  CBC - Abnormal; Notable for the following components:   WBC 17.0 (*)    Platelets 450 (*)    All other components within normal limits  URINALYSIS, COMPLETE (UACMP) WITH MICROSCOPIC - Abnormal; Notable for the following components:   Color, Urine YELLOW (*)    APPearance HAZY (*)    Hgb urine dipstick MODERATE (*)    Leukocytes, UA MODERATE (*)    Bacteria, UA RARE (*)    Squamous Epithelial / LPF 6-30 (*)    All other components within normal limits  C DIFFICILE QUICK SCREEN W PCR REFLEX  URINE CULTURE  CULTURE, BLOOD (ROUTINE  X 2)  CULTURE, BLOOD (ROUTINE X 2)  LIPASE, BLOOD  INFLUENZA PANEL BY PCR (TYPE A & B)  LACTIC ACID, PLASMA  POC URINE PREG, ED  POCT PREGNANCY, URINE   ____________________________________________  EKG  none  ____________________________________________  RADIOLOGY  CXR: Hazy airspace opacity in the left lung base on the frontal view only worrisome for pneumonia ____________________________________________   PROCEDURES  Procedure(s) performed: None Procedures Critical Care performed:  None ____________________________________________   INITIAL IMPRESSION / ASSESSMENT AND PLAN / ED COURSE   26 y.o. female no significant past medical history who presents for evaluation of flulike symptoms since yesterday. patient here has a fever of 100.9, tachycardic with a pulse of 115, her physical exam shows no acute findings. Flu negative. CXR concerning for PNA will give rocephin and doxycycline. Her UA shows rare bacteria and moderate leukocytes however  patient says she has no symptoms of a UTI. We'll send for culture and not treat at this time. Will get blood cultures and lactic and reassess after abx    _________________________ 3:56 PM on 01/03/2017 -----------------------------------------  workup consistent with pneumonia. Flu is negative.patient has a white count of 17, normal lactic. Patient feels markedly improved after fluids, antibiotics, and Toradol. She is requesting to be discharged. I do believe since patient is young and healthy that she is safe for outpatient oral antibiotics at this time. She did receive a dose of IV Rocephin and by mouth doxycycline. She is to be discharged home on doxycycline and Augmentin twice a day for 10 days. Discussed return precautions with patient and recommend close follow-up with her primary care doctor in 2 days.   As part of my medical decision making, I reviewed the following data within the electronic MEDICAL RECORD NUMBER Nursing notes  reviewed and incorporated, Labs reviewed , Radiograph reviewed , Notes from prior ED visits and [place] Controlled Substance Database    Pertinent labs & imaging results that were available during my care of the patient were reviewed by me and considered in my medical decision making (see chart for details).    ____________________________________________   FINAL CLINICAL IMPRESSION(S) / ED DIAGNOSES  Final diagnoses:  Community acquired pneumonia, unspecified laterality      NEW MEDICATIONS STARTED DURING THIS VISIT:  ED Discharge Orders        Ordered    doxycycline (VIBRAMYCIN) 100 MG capsule  2 times daily     01/03/17 1555    amoxicillin-clavulanate (AUGMENTIN) 875-125 MG tablet  2 times daily     01/03/17 1555       Note:  This document was prepared using Dragon voice recognition software and may include unintentional dictation errors.    Nita SickleVeronese, [place], MD 01/03/17 [phone]

## 2017-01-03 NOTE — Discharge Instructions (Signed)

## 2017-01-03 NOTE — ED Triage Notes (Signed)
Vomiting and diarrhea x 2 days.  Over past 24 hours has had 3 episodes of illness.

## 2017-01-03 NOTE — ED Notes (Signed)
Pt discharged to home.  Family member driving.  Discharge instructions reviewed.  Verbalized understanding.  No questions or concerns at this time.  Teach back verified.  Pt in NAD.  No items left in ED.   

## 2017-01-05 LAB — URINE CULTURE

## 2017-01-08 LAB — CULTURE, BLOOD (ROUTINE X 2)
CULTURE: NO GROWTH
Culture: NO GROWTH
Specimen Description: ADEQUATE

## 2018-04-11 ENCOUNTER — Emergency Department
Admission: EM | Admit: 2018-04-11 | Discharge: 2018-04-11 | Disposition: A | Discharge status: Home / Self Care | Payer: Medicaid Other | Attending: Emergency Medicine | Admitting: Emergency Medicine

## 2018-04-11 ENCOUNTER — Encounter: Payer: Self-pay | Admitting: Intensive Care

## 2018-04-11 ENCOUNTER — Other Ambulatory Visit: Payer: Self-pay

## 2018-04-11 DIAGNOSIS — Z79899 Other long term (current) drug therapy: Secondary | ICD-10-CM | POA: Diagnosis not present

## 2018-04-11 DIAGNOSIS — Z87891 Personal history of nicotine dependence: Secondary | ICD-10-CM | POA: Diagnosis not present

## 2018-04-11 DIAGNOSIS — J020 Streptococcal pharyngitis: Secondary | ICD-10-CM | POA: Diagnosis not present

## 2018-04-11 DIAGNOSIS — J029 Acute pharyngitis, unspecified: Secondary | ICD-10-CM | POA: Diagnosis present

## 2018-04-11 LAB — GROUP A STREP BY PCR: GROUP A STREP BY PCR: DETECTED — AB

## 2018-04-11 MED ORDER — AMOXICILLIN 500 MG PO CAPS: 500.0000 mg | ORAL_CAPSULE | Freq: Three times a day (TID) | ORAL | 0 refills | Status: DC

## 2018-04-11 MED ORDER — PREDNISONE 10 MG PO TABS: 40.0000 mg | ORAL_TABLET | Freq: Every day | ORAL | 0 refills | Status: DC

## 2018-04-11 MED ORDER — AMOXICILLIN 500 MG PO CAPS
500.0000 mg | ORAL_CAPSULE | Freq: Once | ORAL | Status: AC
  Administered 2018-04-11: 500 mg via ORAL
  Filled 2018-04-11: qty 1

## 2018-04-11 NOTE — ED Provider Notes (Signed)
[facility] Emergency Department Provider Note  ____________________________________________   First MD Initiated Contact with Patient 04/11/18 1736     (approximate)  I have reviewed the triage vital signs and the nursing notes.   HISTORY  Chief Complaint Sore Throat    HPI Susan Cantrell is a 28 y.o. female presents emergency department complaining of a sore throat for 3 days.  She denies any fever but states she is felt warm.  She has increased pain with swallowing.  She is also complaining of a fine rash on the upper chest.  She denies any cough, congestion, chest pain or shortness of breath.  She states it feels like when she had strep throat    Past Medical History:  Diagnosis Date  . Closed impacted fracture of left hip with routine healing 2014   no sx required  . Fracture of coccyx, initial encounter for closed fracture (HCC) 2014   no sx required  . Pregnancy    [facility] 09/29/2015    Patient Active Problem List   Diagnosis Date Noted  . Postpartum care following vaginal delivery 10/06/2015  . Obesity, morbid, BMI 50 or higher (HCC) 10/06/2015  . Labor and delivery, indication for care 10/04/2015  . Non-reactive NST (non-stress test) 09/29/2015  . Indication for care in labor and delivery, antepartum 09/10/2015  . First trimester screening 03/24/2015    Past Surgical History:  Procedure Laterality Date  . TYMPANOSTOMY TUBE PLACEMENT      Prior to Admission medications   Medication Sig Start Date End Date Taking? Authorizing Provider  amoxicillin (AMOXIL) 500 MG capsule Take 1 capsule (500 mg total) by mouth 3 (three) times daily. 04/11/18   Quinteria Chisum, Roselyn Bering, PA-C  NECON 0.5/35, 28, 0.5-35 MG-MCG tablet Take 1 tablet by mouth daily. 12/06/16   [provider]  predniSONE (DELTASONE) 10 MG tablet Take 4 tablets (40 mg total) by mouth daily with breakfast. 04/11/18   Sherrie Mustache Roselyn Bering, PA-C    Allergies Oxycodone  Family  History  Problem Relation Age of Onset  . Asthma Brother   . Cancer Maternal Grandmother   . Diabetes Maternal Grandmother     Social History Social History   Tobacco Use  . Smoking status: Former Smoker    Packs/day: 0.25    Years: 0.50    Pack years: 0.12    Types: Cigarettes    Last attempt to quit: 12/12/2014    Years since quitting: 3.3  . Smokeless tobacco: Never Used  Substance Use Topics  . Alcohol use: Yes    Alcohol/week: 6.0 standard drinks    Types: 6 Shots of liquor per week  . Drug use: No    Review of Systems  Constitutional: No fever/chills Eyes: No visual changes. ENT: Positive sore throat. Respiratory: Denies cough Genitourinary: Negative for dysuria. Musculoskeletal: Negative for back pain. Skin: Negative for rash.    ____________________________________________   PHYSICAL EXAM:  VITAL SIGNS: ED Triage Vitals  Enc Vitals Group     BP 04/11/18 1720 120/77     Pulse Rate 04/11/18 1720 66     Resp 04/11/18 1720 16     Temp 04/11/18 1720 98.3 F (36.8 C)     Temp Source 04/11/18 1720 Oral     SpO2 04/11/18 1720 96 %     Weight 04/11/18 1721 (!) 304 lb (137.9 kg)     Height 04/11/18 1721 5\' 8"  (1.727 m)     Head Circumference --  Peak Flow --      Pain Score 04/11/18 1721 2     Pain Loc --      Pain Edu? --      Excl. in [place]? --     Constitutional: Alert and oriented. Well appearing and in no acute distress. Eyes: Conjunctivae are normal.  Head: Atraumatic. Nose: No congestion/rhinnorhea. Mouth/Throat: Mucous membranes are moist.  Throat is red tonsils are swollen Neck:  supple no lymphadenopathy noted Cardiovascular: Normal rate, regular rhythm. Heart sounds are normal Respiratory: Normal respiratory effort.  No retractions, lungs c t a  GU: deferred Musculoskeletal: FROM all extremities, warm and well perfused Neurologic:  Normal speech and language.  Skin:  Skin is warm, dry and intact.  Fine red rash noted on upper chest.  Psychiatric: Mood and affect are normal. Speech and behavior are normal.  ____________________________________________   LABS (all labs ordered are listed, but only abnormal results are displayed)  Labs Reviewed  GROUP A STREP BY PCR - Abnormal; Notable for the following components:      Result Value   Group A Strep by PCR DETECTED (*)    All other components within normal limits   ____________________________________________   ____________________________________________  RADIOLOGY    ____________________________________________   PROCEDURES  Procedure(s) performed: No  Procedures    ____________________________________________   INITIAL IMPRESSION / ASSESSMENT AND PLAN / ED COURSE  Pertinent labs & imaging results that were available during my care of the patient were reviewed by me and considered in my medical decision making (see chart for details).   Patient is 28 year old female presents emergency department sore throat.  She denies fever, chills, chest pain or shortness of breath.  Physical exam shows a red throat with questionable tonsillar exudate.  Remainder the exam is unremarkable.  Strep test is positive  Explained test results to the patient.  She is given a prescription for amoxicillin, prednisone and was instructed to discard her toothbrush in 3 days.  She states she understands and will comply.  She was discharged in stable condition.  She was also given a work note stating that she is contagious and should remain out of work for at least 24 hours.     As part of my medical decision making, I reviewed the following data within the electronic MEDICAL RECORD NUMBER Nursing notes reviewed and incorporated, Labs reviewed strep test is positive, Old chart reviewed, Notes from prior ED visits and [place] Controlled Substance Database  ____________________________________________   FINAL CLINICAL IMPRESSION(S) / ED DIAGNOSES  Final diagnoses:  Acute  streptococcal pharyngitis      NEW MEDICATIONS STARTED DURING THIS VISIT:  New Prescriptions   AMOXICILLIN (AMOXIL) 500 MG CAPSULE    Take 1 capsule (500 mg total) by mouth 3 (three) times daily.   PREDNISONE (DELTASONE) 10 MG TABLET    Take 4 tablets (40 mg total) by mouth daily with breakfast.     Note:  This document was prepared using Dragon voice recognition software and may include unintentional dictation errors.    Faythe Ghee, PA-C 04/11/18 1830    Arnaldo Natal, MD 04/11/18 2045

## 2018-04-11 NOTE — Discharge Instructions (Addendum)
Follow-up with your regular doctor if not better in 3 days.  Return emergency department worsening.  Take medication as prescribed.  Tylenol or ibuprofen as needed for pain or fever.  Discard your toothbrush in 3 days.

## 2018-04-11 NOTE — ED Notes (Signed)
See triage note  Presents with sore throat for 3 days   Denies any fever but states having increased pain with swallowing  Also has fine rash to upper chest

## 2018-04-11 NOTE — ED Triage Notes (Signed)
Patient c/o sore throat X2 days. HX strep

## 2018-09-15 LAB — OB RESULTS CONSOLE HEPATITIS B SURFACE ANTIGEN: Hepatitis B Surface Ag: NEGATIVE

## 2018-09-15 LAB — OB RESULTS CONSOLE GC/CHLAMYDIA
Chlamydia: NEGATIVE
Gonorrhea: NEGATIVE

## 2018-09-15 LAB — OB RESULTS CONSOLE RUBELLA ANTIBODY, IGM: Rubella: IMMUNE

## 2018-09-15 LAB — OB RESULTS CONSOLE VARICELLA ZOSTER ANTIBODY, IGG: Varicella: IMMUNE

## 2018-09-15 LAB — OB RESULTS CONSOLE RPR: RPR: NONREACTIVE

## 2018-10-10 ENCOUNTER — Emergency Department
Admission: EM | Admit: 2018-10-10 | Discharge: 2018-10-10 | Disposition: A | Discharge status: Home / Self Care | Payer: Medicaid Other | Attending: Student in an Organized Health Care Education/Training Program | Admitting: Student in an Organized Health Care Education/Training Program

## 2018-10-10 ENCOUNTER — Other Ambulatory Visit: Payer: Self-pay

## 2018-10-10 ENCOUNTER — Encounter: Payer: Self-pay | Admitting: Intensive Care

## 2018-10-10 DIAGNOSIS — E860 Dehydration: Secondary | ICD-10-CM | POA: Diagnosis not present

## 2018-10-10 DIAGNOSIS — Z87891 Personal history of nicotine dependence: Secondary | ICD-10-CM | POA: Diagnosis not present

## 2018-10-10 DIAGNOSIS — O219 Vomiting of pregnancy, unspecified: Secondary | ICD-10-CM | POA: Diagnosis not present

## 2018-10-10 DIAGNOSIS — Z3A15 15 weeks gestation of pregnancy: Secondary | ICD-10-CM | POA: Diagnosis not present

## 2018-10-10 DIAGNOSIS — R519 Headache, unspecified: Secondary | ICD-10-CM

## 2018-10-10 DIAGNOSIS — O26892 Other specified pregnancy related conditions, second trimester: Secondary | ICD-10-CM | POA: Diagnosis not present

## 2018-10-10 DIAGNOSIS — R51 Headache: Secondary | ICD-10-CM | POA: Diagnosis not present

## 2018-10-10 DIAGNOSIS — Z79899 Other long term (current) drug therapy: Secondary | ICD-10-CM | POA: Insufficient documentation

## 2018-10-10 DIAGNOSIS — O99282 Endocrine, nutritional and metabolic diseases complicating pregnancy, second trimester: Secondary | ICD-10-CM | POA: Insufficient documentation

## 2018-10-10 LAB — CBC WITH DIFFERENTIAL/PLATELET
Abs Immature Granulocytes: 0.03 10*3/uL (ref 0.00–0.07)
Basophils Absolute: 0 10*3/uL (ref 0.0–0.1)
Basophils Relative: 0 %
Eosinophils Absolute: 0.1 10*3/uL (ref 0.0–0.5)
Eosinophils Relative: 1 %
HCT: 34.3 % — ABNORMAL LOW (ref 36.0–46.0)
Hemoglobin: 11.7 g/dL — ABNORMAL LOW (ref 12.0–15.0)
Immature Granulocytes: 0 %
Lymphocytes Relative: 17 %
Lymphs Abs: 1.7 10*3/uL (ref 0.7–4.0)
MCH: 29.5 pg (ref 26.0–34.0)
MCHC: 34.1 g/dL (ref 30.0–36.0)
MCV: 86.6 fL (ref 80.0–100.0)
Monocytes Absolute: 0.7 10*3/uL (ref 0.1–1.0)
Monocytes Relative: 7 %
Neutro Abs: 7.6 10*3/uL (ref 1.7–7.7)
Neutrophils Relative %: 75 %
Platelets: 368 10*3/uL (ref 150–400)
RBC: 3.96 MIL/uL (ref 3.87–5.11)
RDW: 12.9 % (ref 11.5–15.5)
WBC: 10.1 10*3/uL (ref 4.0–10.5)
nRBC: 0 % (ref 0.0–0.2)

## 2018-10-10 LAB — COMPREHENSIVE METABOLIC PANEL
ALT: 15 U/L (ref 0–44)
AST: 16 U/L (ref 15–41)
Albumin: 3.7 g/dL (ref 3.5–5.0)
Alkaline Phosphatase: 68 U/L (ref 38–126)
Anion gap: 10 (ref 5–15)
BUN: 5 mg/dL — ABNORMAL LOW (ref 6–20)
CO2: 22 mmol/L (ref 22–32)
Calcium: 9.4 mg/dL (ref 8.9–10.3)
Chloride: 102 mmol/L (ref 98–111)
Creatinine, Ser: 0.45 mg/dL (ref 0.44–1.00)
GFR calc Af Amer: >60 mL/min (ref >60)
GFR calc non Af Amer: >60 mL/min (ref >60)
Glucose, Bld: 100 mg/dL — ABNORMAL HIGH (ref 70–99)
Potassium: 3.8 mmol/L (ref 3.5–5.1)
Sodium: 134 mmol/L — ABNORMAL LOW (ref 135–145)
Total Bilirubin: 0.4 mg/dL (ref 0.3–1.2)
Total Protein: 7.4 g/dL (ref 6.5–8.1)

## 2018-10-10 MED ORDER — METOCLOPRAMIDE HCL 10 MG PO TABS: 10.0000 mg | ORAL_TABLET | Freq: Three times a day (TID) | ORAL | 0 refills | Status: DC | PRN

## 2018-10-10 MED ORDER — ACETAMINOPHEN 500 MG PO TABS
1000.0000 mg | ORAL_TABLET | Freq: Once | ORAL | Status: AC
  Administered 2018-10-10: 19:00:00 1000 mg via ORAL
  Filled 2018-10-10: qty 2

## 2018-10-10 MED ORDER — SODIUM CHLORIDE 0.9 % IV BOLUS
1000.0000 mL | Freq: Once | INTRAVENOUS | Status: AC
  Administered 2018-10-10: 19:00:00 1000 mL via INTRAVENOUS

## 2018-10-10 MED ORDER — METOCLOPRAMIDE HCL 5 MG/ML IJ SOLN
10.0000 mg | Freq: Once | INTRAMUSCULAR | Status: AC
  Administered 2018-10-10: 19:00:00 10 mg via INTRAVENOUS
  Filled 2018-10-10: qty 2

## 2018-10-10 NOTE — ED Provider Notes (Signed)
[facility] Emergency Department Provider Note    First MD Initiated Contact with Patient 10/10/18 1851     (approximate)  I have reviewed the triage vital signs and the nursing notes.   HISTORY  Chief Complaint Headache and Morning Sickness    HPI Susan Cantrell is a 28 y.o. female roughly [redacted] weeks pregnant presents the ER for evaluation of lightheadedness left-sided headache nausea vomiting and morning sickness.  Was evaluated OB/GYN this morning was sent to the ER due to concern for dehydration.  States that she is been taking Tylenol without improvement.  Denies any fevers.  No dysuria.  No abdominal pain.  Has not taken anything for nausea.    Past Medical History:  Diagnosis Date  . Closed impacted fracture of left hip with routine healing 2014   no sx required  . Fracture of coccyx, initial encounter for closed fracture ([place]) 2014   no sx required  . Pregnancy    [facility] 09/29/2015   Family History  Problem Relation Age of Onset  . Asthma Brother   . Cancer Maternal Grandmother   . Diabetes Maternal Grandmother    Past Surgical History:  Procedure Laterality Date  . TYMPANOSTOMY TUBE PLACEMENT     Patient Active Problem List   Diagnosis Date Noted  . Postpartum care following vaginal delivery 10/06/2015  . Obesity, morbid, BMI 50 or higher ([place]) 10/06/2015  . Labor and delivery, indication for care 10/04/2015  . Non-reactive NST (non-stress test) 09/29/2015  . Indication for care in labor and delivery, antepartum 09/10/2015  . First trimester screening 03/24/2015      Prior to Admission medications   Medication Sig Start Date End Date Taking? Authorizing Provider  amoxicillin (AMOXIL) 500 MG capsule Take 1 capsule (500 mg total) by mouth 3 (three) times daily. 04/11/18   Fisher, Linden Dolin, PA-C  NECON 0.5/35, 28, 0.5-35 MG-MCG tablet Take 1 tablet by mouth daily. 12/06/16   [provider]  predniSONE (DELTASONE) 10 MG tablet  Take 4 tablets (40 mg total) by mouth daily with breakfast. 04/11/18   Caryn Section Linden Dolin, PA-C    Allergies Amoxicillin, Doxycycline, and Oxycodone    Social History Social History   Tobacco Use  . Smoking status: Former Smoker    Packs/day: 0.25    Years: 0.50    Pack years: 0.12    Types: Cigarettes    Quit date: 12/12/2014    Years since quitting: 3.8  . Smokeless tobacco: Never Used  Substance Use Topics  . Alcohol use: Yes    Alcohol/week: 6.0 standard drinks    Types: 6 Shots of liquor per week    Comment: rare  . Drug use: No    Review of Systems Patient denies headaches, rhinorrhea, blurry vision, numbness, shortness of breath, chest pain, edema, cough, abdominal pain, nausea, vomiting, diarrhea, dysuria, fevers, rashes or hallucinations unless otherwise stated above in HPI. ____________________________________________   PHYSICAL EXAM:  VITAL SIGNS: Vitals:   10/10/18 1730  BP: (!) 135/91  Pulse: 70  Resp: 16  Temp: 99.4 F (37.4 C)  SpO2: 99%    Constitutional: Alert and oriented.  Eyes: Conjunctivae are normal.  Head: Atraumatic. Nose: No congestion/rhinnorhea. Mouth/Throat: Mucous membranes are moist.   Neck: No stridor. Painless ROM.  Cardiovascular: Normal rate, regular rhythm. Grossly normal heart sounds.  Good peripheral circulation. Respiratory: Normal respiratory effort.  No retractions. Lungs CTAB. Gastrointestinal: Soft and nontender. No distention. No abdominal bruits. No CVA tenderness.  Genitourinary:  Musculoskeletal: No lower extremity tenderness nor edema.  No joint effusions. Neurologic:  CN- intact.  No facial droop, Normal FNF.  Normal heel to shin.  Sensation intact bilaterally. Normal speech and language. No gross focal neurologic deficits are appreciated. No gait instability.  Skin:  Skin is warm, dry and intact. No rash noted. Psychiatric: Mood and affect are normal. Speech and behavior are normal.   ____________________________________________   LABS (all labs ordered are listed, but only abnormal results are displayed)  Results for orders placed or performed during the hospital encounter of 10/10/18 (from the past 24 hour(s))  CBC with Differential     Status: Abnormal   Collection Time: 10/10/18  5:40 PM  Result Value Ref Range   WBC 10.1 4.0 - 10.5 K/uL   RBC 3.96 3.87 - 5.11 MIL/uL   Hemoglobin 11.7 (L) 12.0 - 15.0 g/dL   HCT 40.934.3 (L) 81.136.0 - 91.446.0 %   MCV 86.6 80.0 - 100.0 fL   MCH 29.5 26.0 - 34.0 pg   MCHC 34.1 30.0 - 36.0 g/dL   RDW 78.212.9 95.611.5 - 21.315.5 %   Platelets 368 150 - 400 K/uL   nRBC 0.0 0.0 - 0.2 %   Neutrophils Relative % 75 %   Neutro Abs 7.6 1.7 - 7.7 K/uL   Lymphocytes Relative 17 %   Lymphs Abs 1.7 0.7 - 4.0 K/uL   Monocytes Relative 7 %   Monocytes Absolute 0.7 0.1 - 1.0 K/uL   Eosinophils Relative 1 %   Eosinophils Absolute 0.1 0.0 - 0.5 K/uL   Basophils Relative 0 %   Basophils Absolute 0.0 0.0 - 0.1 K/uL   Immature Granulocytes 0 %   Abs Immature Granulocytes 0.03 0.00 - 0.07 K/uL  Comprehensive metabolic panel     Status: Abnormal   Collection Time: 10/10/18  5:40 PM  Result Value Ref Range   Sodium 134 (L) 135 - 145 mmol/L   Potassium 3.8 3.5 - 5.1 mmol/L   Chloride 102 98 - 111 mmol/L   CO2 22 22 - 32 mmol/L   Glucose, Bld 100 (H) 70 - 99 mg/dL   BUN 5 (L) 6 - 20 mg/dL   Creatinine, Ser 0.860.45 0.44 - 1.00 mg/dL   Calcium 9.4 8.9 - 57.810.3 mg/dL   Total Protein 7.4 6.5 - 8.1 g/dL   Albumin 3.7 3.5 - 5.0 g/dL   AST 16 15 - 41 U/L   ALT 15 0 - 44 U/L   Alkaline Phosphatase 68 38 - 126 U/L   Total Bilirubin 0.4 0.3 - 1.2 mg/dL   GFR calc non Af Amer >60 >60 mL/min   GFR calc Af Amer >60 >60 mL/min   Anion gap 10 5 - 15   ____________________________________________ ____________________________________________  RADIOLOGY   ____________________________________________   PROCEDURES  Procedure(s) performed:  Procedures    Critical  Care performed: no ____________________________________________   INITIAL IMPRESSION / ASSESSMENT AND PLAN / ED COURSE  Pertinent labs & imaging results that were available during my care of the patient were reviewed by me and considered in my medical decision making (see chart for details).   DDX: Hyperemesis, morning sickness, dehydration, electrolyte abnormality, tension, migraine, CVT  Jasiah L Montez MoritaCarter is a 28 y.o. who presents to the ED with symptoms as described above.  Patient well-appearing with reassuring exam and reassuring neuro evaluation.  Symptoms seem primarily related to nausea possible dehydration.  Blood work is reassuring.  No signs of infectious process.  Do  not feel that neuroimaging clinically indicated.  Will try symptomatic management and IV hydration and reassess.  Clinical Course as of Oct 10 2042  Tue Oct 10, 2018  2029 Patient feels significantly improved.  No headache right now.  She is completing her IV fluids.  If able to tolerate oral hydration feeling better and able to walk without feeling lightheaded I do believe she will be appropriate for outpatient follow-up.   [PR]    Clinical Course User Index [PR] Willy Eddy, MD    The patient was evaluated in Emergency Department today for the symptoms described in the history of present illness. He/she was evaluated in the context of the global COVID-19 pandemic, which necessitated consideration that the patient might be at risk for infection with the SARS-CoV-2 virus that causes COVID-19. Institutional protocols and algorithms that pertain to the evaluation of patients at risk for COVID-19 are in a state of rapid change based on information released by regulatory bodies including the CDC and federal and state organizations. These policies and algorithms were followed during the patient's care in the ED.  As part of my medical decision making, I reviewed the following data within the electronic MEDICAL RECORD NUMBER  Nursing notes reviewed and incorporated, Labs reviewed, notes from prior ED visits and [place] Controlled Substance Database   ____________________________________________   FINAL CLINICAL IMPRESSION(S) / ED DIAGNOSES  Final diagnoses:  Dehydration  Non-intractable vomiting with nausea, unspecified vomiting type  Bad headache      NEW MEDICATIONS STARTED DURING THIS VISIT:  New Prescriptions   No medications on file     Note:  This document was prepared using Dragon voice recognition software and may include unintentional dictation errors.    Willy Eddy, MD 10/10/18 2044

## 2018-10-10 NOTE — Discharge Instructions (Addendum)

## 2018-10-10 NOTE — ED Notes (Signed)
Patient given meds as ordered. Will monitor. Reports headache 8/10.

## 2018-10-10 NOTE — ED Triage Notes (Signed)
Patient was sent by OBGYN for migraine that started this AM when she awoke. Reports they also told her she was dehydrated and needed fluids. Has tried tylenol with no relief. Reports 15W 4d pregnant.

## 2018-10-10 NOTE — ED Notes (Signed)
Report 0/10 headache

## 2018-10-15 ENCOUNTER — Emergency Department
Admission: EM | Admit: 2018-10-15 | Discharge: 2018-10-15 | Disposition: A | Discharge status: Home / Self Care | Payer: Medicaid Other | Attending: Emergency Medicine | Admitting: Emergency Medicine

## 2018-10-15 ENCOUNTER — Other Ambulatory Visit: Payer: Self-pay

## 2018-10-15 ENCOUNTER — Emergency Department: Payer: Medicaid Other

## 2018-10-15 ENCOUNTER — Encounter: Payer: Self-pay | Admitting: Emergency Medicine

## 2018-10-15 DIAGNOSIS — Z87891 Personal history of nicotine dependence: Secondary | ICD-10-CM | POA: Insufficient documentation

## 2018-10-15 DIAGNOSIS — R1030 Lower abdominal pain, unspecified: Secondary | ICD-10-CM | POA: Diagnosis not present

## 2018-10-15 DIAGNOSIS — Z3A17 17 weeks gestation of pregnancy: Secondary | ICD-10-CM | POA: Diagnosis not present

## 2018-10-15 DIAGNOSIS — O200 Threatened abortion: Secondary | ICD-10-CM | POA: Diagnosis not present

## 2018-10-15 DIAGNOSIS — O26852 Spotting complicating pregnancy, second trimester: Secondary | ICD-10-CM | POA: Diagnosis present

## 2018-10-15 LAB — URINALYSIS, COMPLETE (UACMP) WITH MICROSCOPIC
Bacteria, UA: NONE SEEN
Bilirubin Urine: NEGATIVE
Glucose, UA: NEGATIVE mg/dL
Ketones, ur: NEGATIVE mg/dL
Nitrite: NEGATIVE
Protein, ur: NEGATIVE mg/dL
Specific Gravity, Urine: 1.014 (ref 1.005–1.030)
pH: 8 (ref 5.0–8.0)

## 2018-10-15 LAB — POCT PREGNANCY, URINE: Preg Test, Ur: POSITIVE — AB

## 2018-10-15 NOTE — ED Provider Notes (Signed)
[facility] Emergency Department Provider Note  Time seen: 9:06 AM  I have reviewed the triage vital signs and the nursing notes.   HISTORY  Chief Complaint Vaginal Bleeding   HPI Susan Cantrell is a 28 y.o. female with no significant past medical history, G3, P1 A1 approximately [redacted] weeks pregnant presents to the emergency department for vaginal bleeding.  According to the patient she woke this morning and noticed blood when she wiped after using the bathroom.  Patient states she became very anxious and grabbed her daughter and came to the emergency department for evaluation.  Patient states 1 prior miscarriage at 6 weeks.  Patient states a very minimal amount of lower abdominal cramping this morning.  Did have intercourse yesterday.  Denies any fever cough or shortness of breath.  Patient follows up with [facility] for OB/GYN care.   Past Medical History:  Diagnosis Date  . Closed impacted fracture of left hip with routine healing 2014   no sx required  . Fracture of coccyx, initial encounter for closed fracture (HCC) 2014   no sx required  . Pregnancy    [facility] 09/29/2015    Patient Active Problem List   Diagnosis Date Noted  . Postpartum care following vaginal delivery 10/06/2015  . Obesity, morbid, BMI 50 or higher (HCC) 10/06/2015  . Labor and delivery, indication for care 10/04/2015  . Non-reactive NST (non-stress test) 09/29/2015  . Indication for care in labor and delivery, antepartum 09/10/2015  . First trimester screening 03/24/2015    Past Surgical History:  Procedure Laterality Date  . TYMPANOSTOMY TUBE PLACEMENT      Prior to Admission medications   Medication Sig Start Date End Date Taking? Authorizing Provider  amoxicillin (AMOXIL) 500 MG capsule Take 1 capsule (500 mg total) by mouth 3 (three) times daily. 04/11/18   Fisher, Roselyn Bering, PA-C  metoCLOPramide (REGLAN) 10 MG tablet Take 1 tablet (10 mg total) by mouth every 8 (eight) hours as  needed for nausea. 10/10/18 10/10/19  Willy Eddy, MD  NECON 0.5/35, 28, 0.5-35 MG-MCG tablet Take 1 tablet by mouth daily. 12/06/16   [provider]  predniSONE (DELTASONE) 10 MG tablet Take 4 tablets (40 mg total) by mouth daily with breakfast. 04/11/18   Sherrie Mustache Roselyn Bering, PA-C    Allergies  Allergen Reactions  . Amoxicillin   . Doxycycline   . Oxycodone Swelling    Throat swelling    Family History  Problem Relation Age of Onset  . Asthma Brother   . Cancer Maternal Grandmother   . Diabetes Maternal Grandmother     Social History Social History   Tobacco Use  . Smoking status: Former Smoker    Packs/day: 0.25    Years: 0.50    Pack years: 0.12    Types: Cigarettes    Quit date: 12/12/2014    Years since quitting: 3.8  . Smokeless tobacco: Never Used  Substance Use Topics  . Alcohol use: Yes    Alcohol/week: 6.0 standard drinks    Types: 6 Shots of liquor per week    Comment: rare  . Drug use: No    Review of Systems Constitutional: Negative for fever. Cardiovascular: Negative for chest pain. Respiratory: Negative for shortness of breath. Gastrointestinal: Minimal lower abdominal cramping. Genitourinary: Positive for vaginal bleeding/spotting this morning. Musculoskeletal: Negative for musculoskeletal complaints Neurological: Negative for headache All other ROS negative  ____________________________________________   PHYSICAL EXAM:  VITAL SIGNS: ED Triage Vitals  Enc Vitals Group  BP 10/15/18 0848 101/85     Pulse Rate 10/15/18 0848 91     Resp 10/15/18 0848 16     Temp 10/15/18 0848 98.5 F (36.9 C)     Temp Source 10/15/18 0848 Oral     SpO2 10/15/18 0848 98 %     Weight 10/15/18 0849 (!) 311 lb (141.1 kg)     Height 10/15/18 0849 5\' 8"  (1.727 m)     Head Circumference --      Peak Flow --      Pain Score 10/15/18 0849 0     Pain Loc --      Pain Edu? --      Excl. in [place]? --    Constitutional: Alert and oriented. Well  appearing and in no distress. Eyes: Normal exam ENT      Head: Normocephalic and atraumatic.      Mouth/Throat: Mucous membranes are moist. Cardiovascular: Normal rate, regular rhythm. Respiratory: Normal respiratory effort without tachypnea nor retractions. Breath sounds are clear  Gastrointestinal: Soft and nontender. No distention.   Musculoskeletal: Nontender with normal range of motion in all extremities.  Neurologic:  Normal speech and language. No gross focal neurologic deficits Skin:  Skin is warm, dry and intact.  Psychiatric: Mood and affect are normal.   ____________________________________________    RADIOLOGY  Ultrasound shows single live IUP at 17 weeks and 1 day.  ____________________________________________   INITIAL IMPRESSION / ASSESSMENT AND PLAN / ED COURSE  Pertinent labs & imaging results that were available during my care of the patient were reviewed by me and considered in my medical decision making (see chart for details).   Patient presents to the emergency department for vaginal bleeding/spotting approximately [redacted] weeks pregnant.  We will obtain a urine sample, point-of-care pregnancy test and obtain an ultrasound.  Bedside fetal heart tones around 150 bpm.  Ultrasound shows a single live IUP at 17 weeks and 1 day.  Overall the patient appears well.  Continues to have light spotting in the emergency department.  We will have the patient follow-up with OB/GYN for further evaluation.  Patient agreeable to plan of care.  Susan Cantrell was evaluated in Emergency Department on 10/15/2018 for the symptoms described in the history of present illness. She was evaluated in the context of the global COVID-19 pandemic, which necessitated consideration that the patient might be at risk for infection with the SARS-CoV-2 virus that causes COVID-19. Institutional protocols and algorithms that pertain to the evaluation of patients at risk for COVID-19 are in a state of  rapid change based on information released by regulatory bodies including the CDC and federal and state organizations. These policies and algorithms were followed during the patient's care in the ED.  ____________________________________________   FINAL CLINICAL IMPRESSION(S) / ED DIAGNOSES  Threatened miscarriage    Harvest Dark, MD 10/15/18 1049

## 2018-10-15 NOTE — ED Triage Notes (Addendum)
Pt to ED via POV stating that she is currently [redacted] weeks pregnant. Pt states that this morning she had bright red blood when using the bathroom. Pt denies any abdominal cramping. Last sexual intercourse was yesterday. Pt anxious and crying in triage. Pt is G3P1A1

## 2019-01-12 NOTE — L&D Delivery Note (Addendum)
Date of delivery: 03/22/2019 Estimated Date of Delivery: 03/29/19 Patient's last menstrual period was 06/22/2018 (exact date). EGA: 110w0d  Delivery Note At 10:04 PM a viable female was delivered via Vaginal, Spontaneous (Presentation: cephalic, LOA).  APGAR: 8, 9; weight 7 lb 12.9 oz (3540 g).   Placenta status: Spontaneous, Intact.  Cord: 3 vessels, nuchal x1 loose, with the following complications: None.  Cord pH: not collected.  Anesthesia: Epidural Episiotomy: None Lacerations: None Suture Repair: n/a Est. Blood Loss (mL):  110cc  Mom presented to L&D with elective induction of labor.  She was given cytotec x2, AROM'd and augmented with pitocin.  epidual placed. Progressed to complete, second stage: <10 mins. delivery of fetal head with restitution to LOT.   Anterior then posterior shoulders delivered without difficulty.  Baby placed on mom's chest, and attended to by peds.  Cord was then clamped and cut by FOB when pulseless.  Placenta spontaneously delivered, intact.   IV pitocin given for hemorrhage prophylaxis.  We sang happy birthday to baby Susan Cantrell.   Mom to postpartum.  Baby to Couplet care / Skin to Skin.  Susan Cantrell Susan Cantrell       Mom presented to L&D with elective induction of labor.  She was given cytotec x2, AROM'd and augmented with pitocin.  epidual placed. Progressed to complete, second stage: <10 mins. delivery of fetal head with restitution to LOT.   Anterior then posterior shoulders delivered without difficulty.  Baby placed on mom's chest, and attended to by peds.  Cord was then clamped and cut by FOB when pulseless.  Placenta spontaneously delivered, intact.   IV pitocin given for hemorrhage prophylaxis.  We sang happy birthday to baby Susan Cantrell.

## 2019-01-23 ENCOUNTER — Other Ambulatory Visit: Payer: Self-pay

## 2019-01-23 ENCOUNTER — Encounter
Admission: RE | Admit: 2019-01-23 | Discharge: 2019-01-23 | Disposition: A | Discharge status: Home / Self Care | Payer: Medicaid Other | Source: Ambulatory Visit | Attending: Anesthesiology | Admitting: Anesthesiology

## 2019-01-23 NOTE — Consult Note (Signed)
[facility] Anesthesia Consultation  Susan Cantrell JQB:[id number] DOB: 11/20/1990 DOA: 01/23/2019 PCP: [facility]   Requesting physician: Dr. Ouida Cantrell Date of consultation: 01/22/18 Reason for consultation: Obesity during pregnancy  CHIEF COMPLAINT:  Obesity during pregnancy  HISTORY OF PRESENT ILLNESS: Susan Cantrell  is a 29 y.o. female with a known history of obesity during pregnancy. She has one prior vaginal delivery with epidural analgesia. Denies hx of cardiovascular disease. Denies hx of asthma. Denies personal or family hx of bleeding disorders.   PAST MEDICAL HISTORY:   Past Medical History:  Diagnosis Date  . Closed impacted fracture of left hip with routine healing 2014   no sx required  . Fracture of coccyx, initial encounter for closed fracture ([place]) 2014   no sx required  . Pregnancy    EDC 09/29/2015    PAST SURGICAL HISTORY:  Past Surgical History:  Procedure Laterality Date  . TYMPANOSTOMY TUBE PLACEMENT      SOCIAL HISTORY:  Social History   Tobacco Use  . Smoking status: Former Smoker    Packs/day: 0.25    Years: 0.50    Pack years: 0.12    Types: Cigarettes    Quit date: 12/12/2014    Years since quitting: 4.1  . Smokeless tobacco: Never Used  Substance Use Topics  . Alcohol use: Yes    Alcohol/week: 6.0 standard drinks    Types: 6 Shots of liquor per week    Comment: rare    FAMILY HISTORY:  Family History  Problem Relation Age of Onset  . Asthma Brother   . Cancer Maternal Grandmother   . Diabetes Maternal Grandmother     DRUG ALLERGIES:  Allergies  Allergen Reactions  . Amoxicillin   . Doxycycline   . Oxycodone Swelling    Throat swelling    REVIEW OF SYSTEMS:   RESPIRATORY: No cough, shortness of breath, wheezing.  CARDIOVASCULAR: No chest pain, orthopnea, edema.  HEMATOLOGY: No anemia, easy bruising or bleeding SKIN: No rash or lesion. NEUROLOGIC: No tingling, numbness,  weakness.  PSYCHIATRY: No anxiety or depression.   MEDICATIONS AT HOME:  Prior to Admission medications   Medication Sig Start Date End Date Taking? Authorizing Provider  aspirin EC 81 MG tablet Take 81 mg by mouth daily. 09/15/18 09/15/19  [provider]  magnesium oxide (MAG-OX) 400 MG tablet Take 1 tablet by mouth daily. 09/15/18 09/15/19  [provider]  metoCLOPramide (REGLAN) 10 MG tablet Take 1 tablet (10 mg total) by mouth every 8 (eight) hours as needed for nausea. 10/10/18 10/10/19  Merlyn Lot, MD  Prenatal 27-1 MG TABS Take 1 tablet by mouth daily. 07/31/18   [provider]      PHYSICAL EXAMINATION:   VITAL SIGNS: Height 5\' 8"  (1.727 m), weight (!) 140.8 kg, last menstrual period 03/29/2018.  GENERAL:  29 y.o.-year-old patient no acute distress.  HEENT: Head atraumatic, normocephalic. Oropharynx and nasopharynx clear. MP II, TM distance >3 cm, normal mouth opening, grade 1 upper lip bite LUNGS: Normal breath sounds bilaterally, no wheezing, rales,rhonchi. No use of accessory muscles of respiration.  CARDIOVASCULAR: S1, S2 normal. No murmurs, rubs, or gallops.  EXTREMITIES: No pedal edema, cyanosis, or clubbing.  NEUROLOGIC: normal gait PSYCHIATRIC: The patient is alert and oriented x 3.  SKIN: No obvious rash, lesion, or ulcer.    IMPRESSION AND PLAN:   Susan Cantrell  is a 29 y.o. female presenting with obesity during pregnancy. BMI is currently 47 at [redacted] weeks  gestation.   Airway exam reassuring today. Spinal interspaces minimally palpable.   We discussed analgesic options during labor including epidural analgesia. Discussed that in obesity there can be increased difficulty with epidural placement or even failure of successful epidural. We also discussed that even after successful epidural placement there is increased risk of catheter migration out of the epidural space that would require catheter replacement. Discussed use of epidural vs  spinal vs GA if cesarean delivery is required. Discussed increased risk of difficult intubation during pregnancy should an emergency cesarean delivery be required.   Plan for delivery at [facility].

## 2019-03-07 LAB — OB RESULTS CONSOLE HIV ANTIBODY (ROUTINE TESTING): HIV: NONREACTIVE

## 2019-03-07 LAB — OB RESULTS CONSOLE GBS: GBS: POSITIVE

## 2019-03-20 ENCOUNTER — Other Ambulatory Visit
Admission: RE | Admit: 2019-03-20 | Discharge: 2019-03-20 | Disposition: A | Discharge status: Home / Self Care | Payer: Medicaid Other | Source: Ambulatory Visit

## 2019-03-20 DIAGNOSIS — Z01812 Encounter for preprocedural laboratory examination: Secondary | ICD-10-CM | POA: Diagnosis present

## 2019-03-20 DIAGNOSIS — Z20822 Contact with and (suspected) exposure to covid-19: Secondary | ICD-10-CM | POA: Insufficient documentation

## 2019-03-21 LAB — SARS CORONAVIRUS 2 (TAT 6-24 HRS): SARS Coronavirus 2: NEGATIVE

## 2019-03-22 ENCOUNTER — Inpatient Hospital Stay
Admission: AD | Admit: 2019-03-22 | Discharge: 2019-03-24 | DRG: 806 | Disposition: A | Discharge status: Home / Self Care | Payer: Medicaid Other | Source: Intra-hospital | Attending: Obstetrics & Gynecology | Admitting: Obstetrics & Gynecology

## 2019-03-22 ENCOUNTER — Other Ambulatory Visit: Payer: Self-pay

## 2019-03-22 ENCOUNTER — Inpatient Hospital Stay: Payer: Medicaid Other | Admitting: Anesthesiology

## 2019-03-22 DIAGNOSIS — O9832 Other infections with a predominantly sexual mode of transmission complicating childbirth: Secondary | ICD-10-CM | POA: Diagnosis present

## 2019-03-22 DIAGNOSIS — Z87891 Personal history of nicotine dependence: Secondary | ICD-10-CM

## 2019-03-22 DIAGNOSIS — D649 Anemia, unspecified: Secondary | ICD-10-CM | POA: Diagnosis present

## 2019-03-22 DIAGNOSIS — Z3A39 39 weeks gestation of pregnancy: Secondary | ICD-10-CM | POA: Diagnosis not present

## 2019-03-22 DIAGNOSIS — A6000 Herpesviral infection of urogenital system, unspecified: Secondary | ICD-10-CM | POA: Diagnosis present

## 2019-03-22 DIAGNOSIS — O99214 Obesity complicating childbirth: Secondary | ICD-10-CM | POA: Diagnosis present

## 2019-03-22 DIAGNOSIS — O99824 Streptococcus B carrier state complicating childbirth: Secondary | ICD-10-CM | POA: Diagnosis present

## 2019-03-22 DIAGNOSIS — O09899 Supervision of other high risk pregnancies, unspecified trimester: Secondary | ICD-10-CM

## 2019-03-22 DIAGNOSIS — O26893 Other specified pregnancy related conditions, third trimester: Secondary | ICD-10-CM | POA: Diagnosis present

## 2019-03-22 DIAGNOSIS — O9902 Anemia complicating childbirth: Secondary | ICD-10-CM | POA: Diagnosis present

## 2019-03-22 HISTORY — DX: Supervision of other high risk pregnancies, unspecified trimester: O09.899

## 2019-03-22 LAB — CBC
HCT: 32.1 % — ABNORMAL LOW (ref 36.0–46.0)
Hemoglobin: 10.5 g/dL — ABNORMAL LOW (ref 12.0–15.0)
MCH: 28.7 pg (ref 26.0–34.0)
MCHC: 32.7 g/dL (ref 30.0–36.0)
MCV: 87.7 fL (ref 80.0–100.0)
Platelets: 349 10*3/uL (ref 150–400)
RBC: 3.66 MIL/uL — ABNORMAL LOW (ref 3.87–5.11)
RDW: 13 % (ref 11.5–15.5)
WBC: 8.2 10*3/uL (ref 4.0–10.5)
nRBC: 0 % (ref 0.0–0.2)

## 2019-03-22 LAB — RAPID HIV SCREEN (HIV 1/2 AB+AG)
HIV 1/2 Antibodies: NONREACTIVE
HIV-1 P24 Antigen - HIV24: NONREACTIVE

## 2019-03-22 LAB — TYPE AND SCREEN
ABO/RH(D): A POS
Antibody Screen: NEGATIVE

## 2019-03-22 LAB — RPR: RPR Ser Ql: NONREACTIVE

## 2019-03-22 LAB — ABO/RH: ABO/RH(D): A POS

## 2019-03-22 MED ORDER — EPHEDRINE 5 MG/ML INJ: 10.0000 mg | INTRAVENOUS | Status: DC | PRN

## 2019-03-22 MED ORDER — LIDOCAINE HCL (PF) 1 % IJ SOLN
INTRAMUSCULAR | Status: DC | PRN
  Administered 2019-03-22 (×2): 3 mL

## 2019-03-22 MED ORDER — BUTORPHANOL TARTRATE 1 MG/ML IJ SOLN: 1.0000 mg | INTRAMUSCULAR | Status: DC | PRN

## 2019-03-22 MED ORDER — ACETAMINOPHEN 500 MG PO TABS
ORAL_TABLET | ORAL | Status: AC
  Filled 2019-03-22: qty 2

## 2019-03-22 MED ORDER — ACETAMINOPHEN 325 MG PO TABS: 650.0000 mg | ORAL_TABLET | ORAL | Status: DC | PRN

## 2019-03-22 MED ORDER — TERBUTALINE SULFATE 1 MG/ML IJ SOLN: 0.2500 mg | Freq: Once | INTRAMUSCULAR | Status: DC | PRN

## 2019-03-22 MED ORDER — FENTANYL 2.5 MCG/ML W/ROPIVACAINE 0.15% IN NS 100 ML EPIDURAL (ARMC)
12.0000 mL/h | EPIDURAL | Status: DC
  Administered 2019-03-22 (×2): 12 mL/h via EPIDURAL
  Filled 2019-03-22: qty 100

## 2019-03-22 MED ORDER — AMMONIA AROMATIC IN INHA
RESPIRATORY_TRACT | Status: AC
  Filled 2019-03-22: qty 10

## 2019-03-22 MED ORDER — ONDANSETRON HCL 4 MG/2ML IJ SOLN
4.0000 mg | Freq: Four times a day (QID) | INTRAMUSCULAR | Status: DC | PRN
  Administered 2019-03-22: 4 mg via INTRAVENOUS
  Filled 2019-03-22: qty 2

## 2019-03-22 MED ORDER — OXYTOCIN 40 UNITS IN NORMAL SALINE INFUSION - SIMPLE MED
1.0000 m[IU]/min | INTRAVENOUS | Status: DC
  Administered 2019-03-22: 2 m[IU]/min via INTRAVENOUS
  Filled 2019-03-22: qty 1000

## 2019-03-22 MED ORDER — MISOPROSTOL 25 MCG QUARTER TABLET
25.0000 ug | ORAL_TABLET | ORAL | Status: DC | PRN
  Administered 2019-03-22 (×2): 25 ug via VAGINAL
  Filled 2019-03-22 (×2): qty 1

## 2019-03-22 MED ORDER — LIDOCAINE HCL (PF) 1 % IJ SOLN
INTRAMUSCULAR | Status: AC
  Filled 2019-03-22: qty 30

## 2019-03-22 MED ORDER — OXYTOCIN 10 UNIT/ML IJ SOLN
INTRAMUSCULAR | Status: AC
  Filled 2019-03-22: qty 2

## 2019-03-22 MED ORDER — OXYTOCIN 40 UNITS IN NORMAL SALINE INFUSION - SIMPLE MED
2.5000 [IU]/h | INTRAVENOUS | Status: DC
  Administered 2019-03-22: 2.5 [IU]/h via INTRAVENOUS

## 2019-03-22 MED ORDER — LACTATED RINGERS IV SOLN: INTRAVENOUS | Status: DC

## 2019-03-22 MED ORDER — LACTATED RINGERS IV SOLN
500.0000 mL | INTRAVENOUS | Status: DC | PRN
  Administered 2019-03-22: 500 mL via INTRAVENOUS

## 2019-03-22 MED ORDER — PHENYLEPHRINE 40 MCG/ML (10ML) SYRINGE FOR IV PUSH (FOR BLOOD PRESSURE SUPPORT): 80.0000 ug | PREFILLED_SYRINGE | INTRAVENOUS | Status: DC | PRN

## 2019-03-22 MED ORDER — MISOPROSTOL 200 MCG PO TABS
ORAL_TABLET | ORAL | Status: AC
  Filled 2019-03-22: qty 4

## 2019-03-22 MED ORDER — FENTANYL 2.5 MCG/ML W/ROPIVACAINE 0.15% IN NS 100 ML EPIDURAL (ARMC)
EPIDURAL | Status: AC
  Filled 2019-03-22: qty 100

## 2019-03-22 MED ORDER — DIPHENHYDRAMINE HCL 50 MG/ML IJ SOLN: 12.5000 mg | INTRAMUSCULAR | Status: DC | PRN

## 2019-03-22 MED ORDER — SOD CITRATE-CITRIC ACID 500-334 MG/5ML PO SOLN: 30.0000 mL | ORAL | Status: DC | PRN

## 2019-03-22 MED ORDER — IBUPROFEN 600 MG PO TABS
ORAL_TABLET | ORAL | Status: AC
  Filled 2019-03-22: qty 1

## 2019-03-22 MED ORDER — IBUPROFEN 600 MG PO TABS
600.0000 mg | ORAL_TABLET | Freq: Four times a day (QID) | ORAL | Status: DC
  Administered 2019-03-22 – 2019-03-24 (×6): 600 mg via ORAL
  Filled 2019-03-22 (×5): qty 1

## 2019-03-22 MED ORDER — ACETAMINOPHEN 500 MG PO TABS
1000.0000 mg | ORAL_TABLET | Freq: Four times a day (QID) | ORAL | Status: DC | PRN
  Administered 2019-03-22 – 2019-03-24 (×5): 1000 mg via ORAL
  Filled 2019-03-22 (×4): qty 2

## 2019-03-22 MED ORDER — MISOPROSTOL 25 MCG QUARTER TABLET
ORAL_TABLET | ORAL | Status: AC
  Filled 2019-03-22: qty 1

## 2019-03-22 MED ORDER — LACTATED RINGERS IV SOLN
500.0000 mL | Freq: Once | INTRAVENOUS | Status: AC
  Administered 2019-03-22: 500 mL via INTRAVENOUS

## 2019-03-22 MED ORDER — LIDOCAINE HCL (PF) 1 % IJ SOLN: 30.0000 mL | INTRAMUSCULAR | Status: DC | PRN

## 2019-03-22 MED ORDER — BUPIVACAINE HCL (PF) 0.25 % IJ SOLN
INTRAMUSCULAR | Status: DC | PRN
  Administered 2019-03-22: 2 mL via EPIDURAL
  Administered 2019-03-22: 3 mL via EPIDURAL

## 2019-03-22 MED ORDER — LIDOCAINE-EPINEPHRINE (PF) 1.5 %-1:200000 IJ SOLN
INTRAMUSCULAR | Status: DC | PRN
  Administered 2019-03-22: 2 mL via PERINEURAL
  Administered 2019-03-22: 3 mL via PERINEURAL

## 2019-03-22 MED ORDER — VANCOMYCIN HCL IN DEXTROSE 1-5 GM/200ML-% IV SOLN
1000.0000 mg | Freq: Two times a day (BID) | INTRAVENOUS | Status: DC
  Administered 2019-03-22 (×2): 1000 mg via INTRAVENOUS
  Filled 2019-03-22 (×4): qty 200

## 2019-03-22 MED ORDER — MISOPROSTOL 25 MCG QUARTER TABLET
25.0000 ug | ORAL_TABLET | ORAL | Status: DC
  Administered 2019-03-22: 25 ug via ORAL

## 2019-03-22 MED ORDER — OXYTOCIN BOLUS FROM INFUSION
500.0000 mL | Freq: Once | INTRAVENOUS | Status: AC
  Administered 2019-03-22: 500 mL via INTRAVENOUS

## 2019-03-22 MED ORDER — MISOPROSTOL 25 MCG QUARTER TABLET: 25.0000 ug | ORAL_TABLET | ORAL | Status: DC

## 2019-03-22 NOTE — Anesthesia Procedure Notes (Addendum)
Epidural Patient location during procedure: OB Start time: 03/22/2019 2:16 PM End time: 03/22/2019 2:43 PM  Staffing Anesthesiologist: Lenard Simmer, MD Resident/CRNA: Elmarie Mainland, CRNA Performed: resident/CRNA   Preanesthetic Checklist Completed: patient identified, IV checked, site marked, risks and benefits discussed, surgical consent, monitors and equipment checked, pre-op evaluation and timeout performed  Epidural Patient position: sitting Prep: ChloraPrep Patient monitoring: continuous pulse ox and blood pressure Approach: midline Location: L3-L4 Injection technique: LOR saline  Needle:  Needle type: Tuohy  Needle gauge: 17 G Needle length: 9 cm Needle insertion depth: 8 cm Catheter at skin depth: 13 cm Test dose: 1.5% lidocaine with Epi 1:200 K  Assessment Events: blood not aspirated, injection not painful, no injection resistance, no paresthesia and negative IV test  Additional Notes Reason for block:procedure for pain

## 2019-03-22 NOTE — Progress Notes (Signed)
0800 Cytotec PO and PV 0920 3/Thick/-3 post 1300 4/Thick/-3 mid  Labor Progress Note  Susan Cantrell is a 29 y.o. G3P1011 at [redacted]w[redacted]d by LMP admitted for induction of labor due to Elective at term.  Subjective: Pt reports feeling more cramping.   Objective: BP (!) 93/45   Pulse 64   Temp 98.5 F (36.9 C) (Oral)   Resp 18   Ht 5\' 8"  (1.727 m)   Wt (!) 138.8 kg   LMP 03/29/2018 (Exact Date)   BMI 46.53 kg/m    Fetal Assessment: FHT:  FHR: 140 bpm, variability: moderate,  accelerations:  Abscent,  decelerations:  Absent Category/reactivity:  Category I UC:   regular, every 1 minutes SVE:    Dilation: 4 cm  Effacement: 30%  Station:  -3  Consistency: medium  Position: middle  Membrane status:Intact   Labs: Lab Results  Component Value Date   WBC 8.2 03/22/2019   HGB 10.5 (L) 03/22/2019   HCT 32.1 (L) 03/22/2019   MCV 87.7 03/22/2019   PLT 349 03/22/2019    Assessment / Plan: Induction of labor due to elective.  Discussed h/o last induction.  Based on that history will plan to start pitocin and AROM.  Pt requested epidural prior to AROM d/t how fast she went last time after AROM.  Labor: Progressing, will start Pitocin Preeclampsia:  no signs or symptoms of toxicity Fetal Wellbeing:  Category I Pain Control: Requesting Epidural I/D:  GBS pos, Afibrile Anticipated MOD:  NSVD  05/22/2019, CNM 03/22/2019, 1:23 PM

## 2019-03-22 NOTE — H&P (Signed)
OB History & Physical   History of Present Illness:  Chief Complaint:   HPI:  Susan Cantrell is a 29 y.o. G3P1011 female at [redacted]w[redacted]d dated by LMP of 06/22/18.  She presents to L&D for elective induction of labor.  She reports:  -active fetal movement -no leakage of fluid  -no vaginal bleeding -no contractions  Pregnancy Issues: 1. 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 BMI - anesthesia consult done    Maternal Medical History:   Past Medical History:  Diagnosis Date  . Closed impacted fracture of left hip with routine healing 2014   no sx required  . Fracture of coccyx, initial encounter for closed fracture ([facility]) 2014   no sx required  . Pregnancy    [facility] 09/29/2015    Past Surgical History:  Procedure Laterality Date  . TYMPANOSTOMY TUBE PLACEMENT      Allergies  Allergen Reactions  . Amoxicillin   . Doxycycline   . Oxycodone Swelling    Throat swelling    Prior to Admission medications   Medication Sig Start Date End Date Taking? Authorizing Provider  aspirin EC 81 MG tablet Take 81 mg by mouth daily. 09/15/18 09/15/19  [provider]  magnesium oxide (MAG-OX) 400 MG tablet Take 1 tablet by mouth daily. 09/15/18 09/15/19  [provider]  metoCLOPramide (REGLAN) 10 MG tablet Take 1 tablet (10 mg total) by mouth every 8 (eight) hours as needed for nausea. 10/10/18 10/10/19  Willy Eddy, MD  Prenatal 27-1 MG TABS Take 1 tablet by mouth daily. 07/31/18   [provider]     Prenatal care site: [facility]   Social History: She  reports that she quit smoking about 4 years ago. Her smoking use included cigarettes. She has a 0.13 pack-year smoking history. She has never used smokeless tobacco. She reports current alcohol use of about 6.0 standard drinks of alcohol per week. She reports that she does not use drugs.  Family History: family history includes Asthma in her brother;  Cancer in her maternal grandmother; Diabetes in her maternal grandmother.   Review of Systems: A full review of systems was performed and negative except as noted in the HPI.    Physical Exam:  Vital Signs: BP (!) 93/45   Pulse 64   Temp 98.1 F (36.7 C)   Resp 18   Ht 5\' 8"  (1.727 m)   Wt (!) 138.8 kg   LMP 03/29/2018 (Exact Date)   BMI 46.53 kg/m   General:   alert and cooperative  Skin:  normal  Neurologic:    Alert & oriented x 3  Lungs:   nl effort  Heart:   regular rate and rhythm  Abdomen:  normal findings: soft, non-tender  Pelvis:  Exam deferred.  FHT:  140 BPM  Presentations: cephalic  Cervix:    Dilation: 3cm inner os, 4 outer os   Effacement: 30%   Station:  -3   Consistency: medium   Position: middle  Extremities: : non-tender, symmetric    EFW: 02/15/2019: Presentation: vertex, spine right, EFW: 5lb14oz 2653g 63%, FHR: 123bpm, Placenta: posterior, AFI: 17.13cm  Results for orders placed or performed during the hospital encounter of 03/22/19 (from the past 24 hour(s))  CBC     Status: Abnormal   Collection Time: 03/22/19  2:27 AM  Result Value Ref Range   WBC 8.2 4.0 - 10.5 K/uL   RBC 3.66 (L)  3.87 - 5.11 MIL/uL   Hemoglobin 10.5 (L) 12.0 - 15.0 g/dL   HCT 32.1 (L) 36.0 - 46.0 %   MCV 87.7 80.0 - 100.0 fL   MCH 28.7 26.0 - 34.0 pg   MCHC 32.7 30.0 - 36.0 g/dL   RDW 13.0 11.5 - 15.5 %   Platelets 349 150 - 400 K/uL   nRBC 0.0 0.0 - 0.2 %  Type and screen [place]     Status: None   Collection Time: 03/22/19  2:27 AM  Result Value Ref Range   ABO/RH(D) A POS    Antibody Screen NEG    Sample Expiration      03/25/2019,2359 Performed at [facility], [address] [place], [place] [address]   Rapid HIV screen (HIV 1/2 Ab+Ag) ([facility] Only)     Status: None   Collection Time: 03/22/19  2:27 AM  Result Value Ref Range   HIV-1 P24 Antigen - HIV24 NON REACTIVE NON REACTIVE   HIV 1/2 Antibodies NON REACTIVE NON REACTIVE    Interpretation (HIV Ag Ab)      A non reactive test result means that HIV 1 or HIV 2 antibodies and HIV 1 p24 antigen were not detected in the specimen.  ABO/Rh     Status: None   Collection Time: 03/22/19  4:39 AM  Result Value Ref Range   ABO/RH(D)      A POS Performed at [facility], [address] [place], [place] [address]     Pertinent Results:  Prenatal Labs: Blood type/Rh A pos  Antibody screen neg  Rubella Immune  Varicella Immune  RPR NR  HBsAg Neg  HIV NR  GC neg  Chlamydia neg  Genetic screening negative  1 hour GTT 113  3 hour GTT   GBS Positive   FHT: FHR: 140 bpm, variability: moderate,  accelerations:  Present,  decelerations:  Absent Category/reactivity:  Category I TOCO: Occasional, pt reports cramping SVE: Dilation: 3 / Effacement (%): Thick / Station: -3    Cephalic SVE   Assessment:  Susan Cantrell is a 29 y.o. G50P1011 female at [redacted]w[redacted]d presents for elective induction of labor.   Plan:  1. Admit to Labor & Delivery; consents reviewed and obtained  2. Fetal Well being  - Fetal Tracing: Cat I - GBS: Postitive - Presentation: vtx confirmed by SVE   3. Routine OB: - Prenatal labs reviewed, as above - Rh pos - CBC & T&S on admit - Clear fluids, IVF  4. Induction of Labor -  Contractions:external toco in place -  Pelvis proven to [phone] -  Plan for induction with Cytotec -  Plan for continuous fetal monitoring  -  Maternal pain control as desired: IVPM, regional anesthesia - Anticipate vaginal delivery  5. Post Partum Planning: - Infant feeding: Breast - Contraception: none/lactational amenorrhea  Adaliz Dobis, CNM 03/22/2019 10:59 AM

## 2019-03-22 NOTE — Anesthesia Preprocedure Evaluation (Signed)
Anesthesia Evaluation  Patient identified by MRN, date of birth, ID band Patient awake    Reviewed: Allergy & Precautions, H&P , NPO status , Patient's Chart, lab work & pertinent test results  Airway Mallampati: II  TM Distance: >3 FB Neck ROM: full    Dental no notable dental hx.    Pulmonary former smoker,           Cardiovascular negative cardio ROS       Neuro/Psych negative neurological ROS  negative psych ROS   GI/Hepatic Neg liver ROS, GERD  ,  Endo/Other  negative endocrine ROS  Renal/GU negative Renal ROS  negative genitourinary   Musculoskeletal   Abdominal   Peds  Hematology negative hematology ROS (+)   Anesthesia Other Findings   Reproductive/Obstetrics (+) Pregnancy                             Anesthesia Physical Anesthesia Plan  ASA: II  Anesthesia Plan: Epidural   Post-op Pain Management:    Induction:   PONV Risk Score and Plan:   Airway Management Planned:   Additional Equipment:   Intra-op Plan:   Post-operative Plan:   Informed Consent:   Plan Discussed with: CRNA  Anesthesia Plan Comments:         Anesthesia Quick Evaluation

## 2019-03-22 NOTE — Progress Notes (Signed)
0800 Cytotec PO and PV 0920 3/Thick/-3 post 1300 4/Thick/-3 mid 1650 4/Thick/-3 Ant AROM'd IUPC   Labor Progress Note  Susan Cantrell is a 29 y.o. G3P1011 at [redacted]w[redacted]d by LMP admitted for induction of labor due to Elective at term.  Subjective: Pt comfortable with epidural.  Objective: BP 112/83   Pulse 84   Temp 98.3 F (36.8 C) (Oral)   Resp 17   Ht 5\' 8"  (1.727 m)   Wt (!) 138.8 kg   LMP 03/29/2018 (Exact Date)   SpO2 97%   BMI 46.53 kg/m    Fetal Assessment: FHT:  FHR: 140 bpm, variability: minimal ,  accelerations:  Abscent,  decelerations:  Absent Category/reactivity:  Category II UC:   regular, every 1-3 minutes SVE:    Dilation: 4 cm  Effacement: 30%  Station:  -3  Consistency: medium  Position: anterior  Membrane status:AROM'd with clear fluid   Assessment / Plan: After 2 doses of Cytotec, Pitocin was started and an Epidural was placed. AROM'd and IUPC placed  Labor: Continued induction Preeclampsia:  no signs or symptoms of toxicity Fetal Wellbeing:  Category I Pain Control:  Epidural I/D:  GBS pos, Afibrile Anticipated MOD:  NSVD  03/31/2018, CNM 03/22/2019, 7:45 PM

## 2019-03-23 ENCOUNTER — Encounter: Payer: Self-pay | Admitting: Obstetrics and Gynecology

## 2019-03-23 LAB — CBC
HCT: 31.3 % — ABNORMAL LOW (ref 36.0–46.0)
Hemoglobin: 10.3 g/dL — ABNORMAL LOW (ref 12.0–15.0)
MCH: 29.1 pg (ref 26.0–34.0)
MCHC: 32.9 g/dL (ref 30.0–36.0)
MCV: 88.4 fL (ref 80.0–100.0)
Platelets: 269 10*3/uL (ref 150–400)
RBC: 3.54 MIL/uL — ABNORMAL LOW (ref 3.87–5.11)
RDW: 13.2 % (ref 11.5–15.5)
WBC: 9.2 10*3/uL (ref 4.0–10.5)
nRBC: 0 % (ref 0.0–0.2)

## 2019-03-23 MED ORDER — DOCUSATE SODIUM 100 MG PO CAPS
100.0000 mg | ORAL_CAPSULE | Freq: Two times a day (BID) | ORAL | Status: DC
  Administered 2019-03-23 (×3): 100 mg via ORAL
  Filled 2019-03-23 (×3): qty 1

## 2019-03-23 MED ORDER — SIMETHICONE 80 MG PO CHEW: 80.0000 mg | CHEWABLE_TABLET | ORAL | Status: DC | PRN

## 2019-03-23 MED ORDER — BENZOCAINE-MENTHOL 20-0.5 % EX AERO: 1.0000 "application " | INHALATION_SPRAY | CUTANEOUS | Status: DC | PRN

## 2019-03-23 MED ORDER — PRENATAL MULTIVITAMIN CH
1.0000 | ORAL_TABLET | Freq: Every day | ORAL | Status: DC
  Administered 2019-03-23: 1 via ORAL
  Filled 2019-03-23: qty 1

## 2019-03-23 MED ORDER — DIBUCAINE (PERIANAL) 1 % EX OINT: 1.0000 "application " | TOPICAL_OINTMENT | CUTANEOUS | Status: DC | PRN

## 2019-03-23 MED ORDER — FERROUS SULFATE 325 (65 FE) MG PO TABS
325.0000 mg | ORAL_TABLET | Freq: Every day | ORAL | Status: DC
  Administered 2019-03-23 – 2019-03-24 (×2): 325 mg via ORAL
  Filled 2019-03-23 (×2): qty 1

## 2019-03-23 MED ORDER — WITCH HAZEL-GLYCERIN EX PADS
1.0000 "application " | MEDICATED_PAD | CUTANEOUS | Status: DC
  Administered 2019-03-23: 1 via TOPICAL
  Filled 2019-03-23: qty 100

## 2019-03-23 MED ORDER — DIPHENHYDRAMINE HCL 25 MG PO CAPS: 25.0000 mg | ORAL_CAPSULE | Freq: Four times a day (QID) | ORAL | Status: DC | PRN

## 2019-03-23 MED ORDER — ONDANSETRON HCL 4 MG PO TABS: 4.0000 mg | ORAL_TABLET | ORAL | Status: DC | PRN

## 2019-03-23 MED ORDER — OXYTOCIN 40 UNITS IN NORMAL SALINE INFUSION - SIMPLE MED
INTRAVENOUS | Status: AC
  Filled 2019-03-23: qty 1000

## 2019-03-23 MED ORDER — COCONUT OIL OIL
1.0000 "application " | TOPICAL_OIL | Status: DC | PRN
  Administered 2019-03-23: 1 via TOPICAL
  Filled 2019-03-23: qty 120

## 2019-03-23 MED ORDER — ONDANSETRON HCL 4 MG/2ML IJ SOLN: 4.0000 mg | INTRAMUSCULAR | Status: DC | PRN

## 2019-03-23 NOTE — Lactation Note (Addendum)
This note was copied from a baby's chart. Lactation Consultation Note  Patient Name: Susan Cantrell [id number] Date: 03/23/2019 Reason for consult: Initial assessment;Term   Maternal Data Formula Feeding for Exclusion: No Has patient been taught Hand Expression?: Yes Does the patient have breastfeeding experience prior to this delivery?: Yes  Feeding Feeding Type: Breast Fed Baby latches easily to breast with assist from mom  LATCH Score Latch: Grasps breast easily, tongue down, lips flanged, rhythmical sucking.  Audible Swallowing: Spontaneous and intermittent  Type of Nipple: Everted at rest and after stimulation  Comfort (Breast/Nipple): Soft / non-tender  Hold (Positioning): No assistance needed to correctly position infant at breast.  LATCH Score: 10  Interventions Interventions: Skin to skin  Lactation Tools Discussed/Used WIC Program: Yes [facility] name and no written on white board for mom to call for questions or assistance  Consult Status Consult Status: PRN    Dyann Kief 03/23/2019, 2:06 PM

## 2019-03-23 NOTE — Progress Notes (Signed)
Post Partum Day 1  Subjective: Doing well, no concerns. Ambulating without difficulty, pain managed with PO meds, tolerating regular diet, and voiding without difficulty.   No fever/chills, chest pain, shortness of breath, nausea/vomiting, or leg pain. No nipple or breast pain.   Objective: BP 108/77 (BP Location: Right Arm)   Pulse (!) 46 Comment: nurse Lodema Pilot notified  Temp 97.7 F (36.5 C) (Oral)   Resp 20   Ht 5\' 8"  (1.727 m)   Wt (!) 138.8 kg   LMP 03/29/2018 (Exact Date)   SpO2 100%   Breastfeeding Unknown   BMI 46.53 kg/m    Physical Exam:  General: alert, cooperative, appears stated age and no distress Breasts: soft/nontender CV: RRR Pulm: nl effort, CTABL Abdomen: soft, non-tender, active bowel sounds Uterine Fundus: firm Lochia: appropriate DVT Evaluation: No evidence of DVT seen on physical exam. No cords or calf tenderness. No significant calf/ankle edema.  Recent Labs    03/22/19 0227 03/23/19 0614  HGB 10.5* 10.3*  HCT 32.1* 31.3*  WBC 8.2 9.2  PLT 349 269    Assessment/Plan: 29 y.o. 34 postpartum day # 1  -Continue routine postpartum care -Lactation consult PRN for breastfeeding. -Mild anemia - hemodynamically stable and asymptomatic; continue daily PNV with daily PO ferrous sulfate with stool softeners  -Immunization status: all immunizations up to date  Disposition: Continue inpatient postpartum care   LOS: 1 day   [id number], CNM 03/23/2019, 4:55 PM   ----- 05/23/2019 Certified Nurse Midwife [facility]

## 2019-03-23 NOTE — Discharge Summary (Signed)
Obstetrical Discharge Summary  Patient Name: Susan Cantrell DOB: 11/08/1990 MRN: [id number]  Date of Admission: 03/22/2019 Date of Delivery: 03/22/2019 Delivered by: Ranae Plumber, MD Date of Discharge: 03/24/2019  Primary OB: [facility]  [id number] last menstrual period was 06/22/2018 (exact date). EDC Estimated Date of Delivery: 03/29/19 Gestational Age at Delivery: [redacted]w[redacted]d   Antepartum complications:  1. 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 BMI - anesthesia consult done Admitting Diagnosis: elective IOL Secondary Diagnosis: Patient Active Problem List   Diagnosis Date Noted  . Supervision of other high risk pregnancy, antepartum 03/22/2019  . Postpartum care following vaginal delivery 10/06/2015  . Obesity, morbid, BMI 50 or higher (HCC) 10/06/2015  . Labor and delivery, indication for care 10/04/2015  . Non-reactive NST (non-stress test) 09/29/2015  . Indication for care in labor and delivery, antepartum 09/10/2015  . First trimester screening 03/24/2015    Augmentation: AROM, Pitocin and Cytotec Complications: None Intrapartum complications/course: Mom presented to L&D with elective induction of labor.  She was given cytotec x2, AROM'd and augmented with pitocin.  epidual placed. Progressed to complete, second stage: <10 mins. delivery of fetal head with restitution to LOT.   Anterior then posterior shoulders delivered without difficulty.  Baby placed on mom's chest, and attended to by peds.  Cord was then clamped and cut by FOB when pulseless.  Placenta spontaneously delivered, intact.   IV pitocin given for hemorrhage prophylaxis. Delivery Type: spontaneous vaginal delivery Anesthesia: epidural Placenta: spontaneous Laceration: none Episiotomy: none Newborn Data: Live born female "[place]" Birth Weight: 7 lb 12.9 oz (3540 g) APGAR: 8, 9  Newborn Delivery   Birth date/time: 03/22/2019 22:04:00 Delivery  type: Vaginal, Spontaneous     Postpartum Procedures: None  Edinburgh:  Edinburgh Postnatal Depression Scale Screening Tool 03/23/2019 03/23/2019  I have been able to laugh and see the funny side of things. 0 (No Data)  I have looked forward with enjoyment to things. 0 -  I have blamed myself unnecessarily when things went wrong. 0 -  I have been anxious or worried for no good reason. 3 -  I have felt scared or panicky for no good reason. 0 -  Things have been getting on top of me. 0 -  I have been so unhappy that I have had difficulty sleeping. 0 -  I have felt sad or miserable. 0 -  I have been so unhappy that I have been crying. 0 -  The thought of harming myself has occurred to me. 0 -  Edinburgh Postnatal Depression Scale Total 3 -     Post partum course:  Patient had an uncomplicated postpartum course.  By time of discharge on PPD# 2, her pain was controlled on oral pain medications; she had appropriate lochia and was ambulating, voiding without difficulty and tolerating regular diet.  She was deemed stable for discharge to home.     Discharge Physical Exam:  BP (!) 116/50 (BP Location: Left Arm) Comment: nurse notified  Pulse 64   Temp 98.1 F (36.7 C) (Oral)   Resp 18   Ht 5\' 8"  (1.727 m)   Wt (!) 138.8 kg   LMP 03/29/2018 (Exact Date)   SpO2 99%   Breastfeeding Unknown   BMI 46.53 kg/m   General: NAD CV: RRR Pulm: CTABL, nl effort ABD: s/nd/nt, fundus firm and below the umbilicus Lochia: moderate DVT Evaluation: LE non-ttp, no evidence of DVT on exam.  Hemoglobin  Date Value Ref Range Status  03/23/2019 10.3 (L) 12.0 - 15.0 g/dL Final   HGB  Date Value Ref Range Status  01/19/2014 11.8 (L) 12.0 - 16.0 g/dL Final   HCT  Date Value Ref Range Status  03/23/2019 31.3 (L) 36.0 - 46.0 % Final  01/19/2014 36.3 35.0 - 47.0 % Final     Disposition: stable, discharge to home. Baby Feeding: breastmilk Baby Disposition: home with mom  Rh Immune globulin  given: n/a Rubella vaccine given: n/a Flu vaccine given in AP or PP setting: declined  Contraception: Lactational amenorrhea method   Prenatal Labs:  Blood type/Rh A pos  Antibody screen neg  Rubella Immune  Varicella Immune  RPR NR  HBsAg Neg  HIV NR  GC neg  Chlamydia neg  Genetic screening negative  1 hour GTT 113  3 hour GTT   GBS Positive    Plan:  Jenavee Ival Bible was discharged to home in good condition. Follow-up appointment with Dr. Leonides Schanz in 4-6 weeks.  Discharge Medications: Allergies as of 03/24/2019      Reactions   Amoxicillin    Doxycycline    Oxycodone Swelling   Throat swelling      Medication List    STOP taking these medications   aspirin EC 81 MG tablet   magnesium oxide 400 MG tablet Commonly known as: MAG-OX   metoCLOPramide 10 MG tablet Commonly known as: REGLAN     TAKE these medications   acetaminophen 500 MG tablet Commonly known as: TYLENOL Take 2 tablets (1,000 mg total) by mouth every 6 (six) hours as needed (for pain scale < 4).   coconut oil Oil Apply 1 application topically as needed.   ibuprofen 600 MG tablet Commonly known as: ADVIL Take 1 tablet (600 mg total) by mouth every 6 (six) hours as needed for mild pain or moderate pain.   Prenatal 27-1 MG Tabs Take 1 tablet by mouth daily.       Follow-up Information    Ward, Honor Loh, MD. Schedule an appointment as soon as possible for a visit in 6 week(s).   Specialty: Obstetrics and Gynecology Why: Please call to schedule a postpartum follow up in 6 weeks. Contact information: [place] [address] [place], [place] Certified Nurse Midwife [place] [facility]

## 2019-03-23 NOTE — Anesthesia Postprocedure Evaluation (Signed)
Anesthesia Post Note  Patient: Susan Cantrell  Procedure(s) Performed: AN AD HOC LABOR EPIDURAL  Patient location during evaluation: Mother Baby Anesthesia Type: Epidural Level of consciousness: awake and alert Pain management: pain level controlled Vital Signs Assessment: post-procedure vital signs reviewed and stable Respiratory status: spontaneous breathing, nonlabored ventilation and respiratory function stable Cardiovascular status: stable Postop Assessment: no headache, no backache and epidural receding Anesthetic complications: no     Last Vitals:  Vitals:   03/23/19 0212 03/23/19 0521  BP: (!) 99/56 (!) 99/59  Pulse: 67 (!) 57  Resp: 17 18  Temp: 37.1 C 36.6 C  SpO2: 97% 99%    Last Pain:  Vitals:   03/23/19 0521  TempSrc: Oral  PainSc:                  Rica Mast

## 2019-03-24 MED ORDER — ACETAMINOPHEN 500 MG PO TABS: 1000.0000 mg | ORAL_TABLET | Freq: Four times a day (QID) | ORAL | 0 refills | Status: DC | PRN

## 2019-03-24 MED ORDER — COCONUT OIL OIL: 1.0000 "application " | TOPICAL_OIL | 0 refills | Status: DC | PRN

## 2019-03-24 MED ORDER — IBUPROFEN 600 MG PO TABS: 600.0000 mg | ORAL_TABLET | Freq: Four times a day (QID) | ORAL | 3 refills | Status: DC | PRN

## 2019-03-24 NOTE — Plan of Care (Signed)
Reviewed D/C instructions with pt and family. Pt verbalized understanding of teaching. Discharged to home via W/C. Pt to schedule f/u appt.  

## 2019-03-24 NOTE — Discharge Instructions (Signed)
Vaginal Delivery, Care After Refer to this sheet in the next few weeks. These discharge instructions provide you with information on caring for yourself after delivery. Your caregiver may also give you specific instructions. Your treatment has been planned according to the most current medical practices available, but problems sometimes occur. Call your caregiver if you have any problems or questions after you go home. HOME CARE INSTRUCTIONS 1. Take over-the-counter or prescription medicines only as directed by your caregiver or pharmacist. 2. Do not drink alcohol, especially if you are breastfeeding or taking medicine to relieve pain. 3. Do not smoke tobacco. 4. Continue to use good perineal care. Good perineal care includes: 1. Wiping your perineum from back to front 2. Keeping your perineum clean. 3. You can do sitz baths twice a day, to help keep this area clean 5. Do not use tampons, douche or have sex until your caregiver says it is okay. 6. Shower only and avoid sitting in submerged water, aside from sitz baths 7. Wear a well-fitting bra that provides breast support. 8. Eat healthy foods. 9. Drink enough fluids to keep your urine clear or pale yellow. 10. Eat high-fiber foods such as whole grain cereals and breads, brown rice, beans, and fresh fruits and vegetables every day. These foods may help prevent or relieve constipation. 11. Avoid constipation with high fiber foods or medications, such as miralax or metamucil 12. Follow your caregiver's recommendations regarding resumption of activities such as climbing stairs, driving, lifting, exercising, or traveling. 13. Talk to your caregiver about resuming sexual activities. Resumption of sexual activities is dependent upon your risk of infection, your rate of healing, and your comfort and desire to resume sexual activity. 14. Try to have someone help you with your household activities and your newborn for at least a few days after you leave  the hospital. 15. Rest as much as possible. Try to rest or take a nap when your newborn is sleeping. 16. Increase your activities gradually. 17. Keep all of your scheduled postpartum appointments. It is very important to keep your scheduled follow-up appointments. At these appointments, your caregiver will be checking to make sure that you are healing physically and emotionally. SEEK MEDICAL CARE IF:   You are passing large clots from your vagina. Save any clots to show your caregiver.  You have a foul smelling discharge from your vagina.  You have trouble urinating.  You are urinating frequently.  You have pain when you urinate.  You have a change in your bowel movements.  You have increasing redness, pain, or swelling near your vaginal incision (episiotomy) or vaginal tear.  You have pus draining from your episiotomy or vaginal tear.  Your episiotomy or vaginal tear is separating.  You have painful, hard, or reddened breasts.  You have a severe headache.  You have blurred vision or see spots.  You feel sad or depressed.  You have thoughts of hurting yourself or your newborn.  You have questions about your care, the care of your newborn, or medicines.  You are dizzy or light-headed.  You have a rash.  You have nausea or vomiting.  You were breastfeeding and have not had a menstrual period within 12 weeks after you stopped breastfeeding.  You are not breastfeeding and have not had a menstrual period by the 12th week after delivery.  You have a fever. SEEK IMMEDIATE MEDICAL CARE IF:   You have persistent pain.  You have chest pain.  You have shortness of breath.    You faint.  You have leg pain.  You have stomach pain.  Your vaginal bleeding saturates two or more sanitary pads in 1 hour. MAKE SURE YOU:   Understand these instructions.  Will watch your condition.  Will get help right away if you are not doing well or get worse. Document Released:  12/26/1999 Document Revised: 05/14/2013 Document Reviewed: 08/25/2011 ExitCare Patient Information 2015 [employer]. This information is not intended to replace advice given to you by your health care provider. Make sure you discuss any questions you have with your health care provider.  Sitz Bath A sitz bath is a warm water bath taken in the sitting position. The water covers only the hips and butt (buttocks). We recommend using one that fits in the toilet, to help with ease of use and cleanliness. It may be used for either healing or cleaning purposes. Sitz baths are also used to relieve pain, itching, or muscle tightening (spasms). The water may contain medicine. Moist heat will help you heal and relax.  HOME CARE  Take 3 to 4 sitz baths a day. 18. Fill the bathtub half-full with warm water. 19. Sit in the water and open the drain a little. 20. Turn on the warm water to keep the tub half-full. Keep the water running constantly. 21. Soak in the water for 15 to 20 minutes. 22. After the sitz bath, pat the affected area dry. GET HELP RIGHT AWAY IF: You get worse instead of better. Stop the sitz baths if you get worse. MAKE SURE YOU:  Understand these instructions.  Will watch your condition.  Will get help right away if you are not doing well or get worse. Document Released: 02/05/2004 Document Revised: 09/22/2011 Document Reviewed: 04/27/2010 ExitCare Patient Information 2015 [employer]. This information is not intended to replace advice given to you by your health care provider. Make sure you discuss any questions you have with your health care provider.    

## 2019-04-03 ENCOUNTER — Ambulatory Visit: Payer: Self-pay

## 2019-04-03 NOTE — Lactation Note (Deleted)
This note was copied from a baby's chart. Lactation Consultation Note  Patient Name: Susan Cantrell Today's Date: 04/03/2019     [facility] student called to see how feeding has been going. [name] reports that when she pumps she gets 4-6oz. And that before she feels her breasts getting hard, she pumps to relief.   [facility] student provided education re: supply and demand, massage for relief.  [name] reported all questions answered at this time and knows to call. PRN      [name] 04/03/2019, 11:46 AM

## 2019-05-02 ENCOUNTER — Telehealth: Payer: Self-pay | Admitting: Licensed Clinical Social Worker

## 2019-05-02 NOTE — Telephone Encounter (Signed)
-----  Message from Mateo Flow sent at 05/02/2019 11:12 AM EDT ----- Regarding: Refrral Good morning,  I recently met this patient at [facility] clinic. She was attending her 6 week postpartum appointment and the midwife mentioned that she has some feelings of depression. She scored an 63 on the [place]. I did not have her open for OB but I may for [facility].   Let me know if you have any questions.  Thank you! Mateo Flow

## 2019-05-03 ENCOUNTER — Other Ambulatory Visit: Payer: Self-pay

## 2019-05-03 ENCOUNTER — Encounter: Payer: Self-pay | Admitting: Licensed Clinical Social Worker

## 2019-05-03 ENCOUNTER — Ambulatory Visit: Payer: Self-pay | Admitting: Licensed Clinical Social Worker

## 2019-05-03 DIAGNOSIS — O99345 Other mental disorders complicating the puerperium: Secondary | ICD-10-CM

## 2019-05-03 DIAGNOSIS — F53 Postpartum depression: Secondary | ICD-10-CM
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
## 2019-05-09 ENCOUNTER — Ambulatory Visit: Payer: Medicaid Other | Admitting: Licensed Clinical Social Worker

## 2019-05-09 DIAGNOSIS — F53 Postpartum depression: Secondary | ICD-10-CM

## 2019-05-09 DIAGNOSIS — O99345 Other mental disorders complicating the puerperium: Secondary | ICD-10-CM

## 2019-05-09 NOTE — Progress Notes (Signed)
Counselor/Therapist Progress Note  Patient ID: Susan Cantrell, MRN: [id number],    Date: 05/09/2019  Time Spent: 46 minutes   Treatment Type: Individual Therapy  Reported Symptoms: Feelings of Worthlessness, Hopelessness, Obsessive thinking, Anhedonia, Appetite disturbance, Isolation and withdrawal, Fatigue and anxious thoughts   Mental Status Exam:   Appearance:   Casual     Behavior:  Appropriate and Sharing  Motor:  Normal  Speech/Language:   Normal Rate  Affect:  Appropriate  Mood:  depressed  Thought process:  normal  Thought content:    WNL  Sensory/Perceptual disturbances:    WNL  Orientation:  oriented to person, place, time/date and situation  Attention:  Good  Concentration:  Good  Memory:  WNL  Fund of knowledge:   Good  Insight:    Good  Judgment:   Good  Impulse Control:  Good   Risk Assessment: Danger to Self:  No Self-injurious Behavior: No Danger to Others: No Duty to Warn:no Physical Aggression / Violence:No  Access to Firearms a concern: No  Gang Involvement:No   Subjective: Patient was engaged and cooperative throughout the session using time effectively to discuss thoughts, feelings, and treatment plan. Patient voices continued motivation for treatment and understanding of depression. Patient is likely to benefit from future treatment because she remains motivated to decrease depression and reports benefit of regular sessions in addressing these symptoms. Patient also reports that she began taking Zoloft and reports benefit.   Interventions: Cognitive Behavioral Therapy  Established psychological safety. Checked in with patient. Reviewed previous session and continued to process psychosocial stressors, continued depression symptoms due to postpartum. Explored patient's goal of treatment and worked collaboratively to develop CBT treatment plan. Provided Psychoeducation on mindfulness, engaged patient in mindfulness exercise, processed exercise, and  contracted with patient to complete daily. Provided support through active listening, validation of feelings, and highlighted patient's strengths.  Diagnosis:   ICD-10-CM   1. Depression, postpartum  O99.345    F53.0     Plan:  Patient's goal: Not being so stressed with everything on her mind   Treatment Target: Understand the relationship between thoughts, emotions, and behaviors  - Psychoeducation on CBT model   - Teach the connection between thoughts, emotions, and behaviors   Treatment Target: Increase realistic balanced thinking  - Explore patient's thoughts, beliefs, automatic thoughts, assumptions  - Identify and replace thinking that leads to depression - Process distress and allow for emotional release  - Cognitive reframing  - Questioning and challenging thoughts Treatment Target: Reducing vulnerability to "emotional mind" - Values clarification   - Self-care - nutrition, sleep, exercise  - Increase positive events  - Teach and practice mindfulness    Future Appointments  Date Time Provider Department Center  05/15/2019  3:00 PM Kathreen Cosier, LCSW [facility] None    Interpreter used: NA   Kathreen Cosier, LCSW

## 2019-05-15 ENCOUNTER — Ambulatory Visit: Payer: Medicaid Other | Admitting: Licensed Clinical Social Worker

## 2019-05-15 DIAGNOSIS — O99345 Other mental disorders complicating the puerperium: Secondary | ICD-10-CM

## 2019-05-15 DIAGNOSIS — F53 Postpartum depression: Secondary | ICD-10-CM

## 2019-05-15 NOTE — Progress Notes (Signed)
Counselor/Therapist Progress Note  Patient ID: Susan Cantrell, MRN: [id number],    Date: 05/15/2019  Time Spent: 55 minutes   Treatment Type: Individual Therapy  Reported Symptoms: continued depressive symptoms, depressed mood, ruminating  thoughts, worthlessness   Mental Status Exam:  Appearance:   Casual     Behavior:  Appropriate and Sharing  Motor:  Normal  Speech/Language:   Normal Rate  Affect:  Appropriate and Congruent  Mood:  normal  Thought process:  normal  Thought content:    WNL  Sensory/Perceptual disturbances:    WNL  Orientation:  oriented to person, place, time/date and situation  Attention:  Good  Concentration:  Good  Memory:  WNL  Fund of knowledge:   Good  Insight:    Good  Judgment:   Good  Impulse Control:  Good   Risk Assessment: Danger to Self:  No Self-injurious Behavior: No Danger to Others: No Duty to Warn:no Physical Aggression / Violence:No  Access to Firearms a concern: No  Gang Involvement:No   Subjective: Patient was engaged and cooperative throughout the session using time effectively to discuss thoughts and feelings. Patient voices continued motivation for treatment and understanding of depression. Patient is likely to benefit from future treatment because she remains motivated to decrease depression and reports benefit of regular sessions in addressing these symptoms.   Interventions: Cognitive Behavioral Therapy  Established psychological safety. Checked in with patient and engaged her in processing current psychosocial stressors, continued depressive symptoms and anxiety due to postpartum depression and separation with baby's father. Normalized patient's feelings of sadness and despar, identifying origins of these feelings within the relationship with father of baby and parenting. Highlighted patient's unhelpful thoughts and used socratic questioning to question view points to increase flexibility in thinking. Lead patient in  mindfulness exercise. Encouraged patient to continue to engage in positive supportive relationships. Provided empathetic reflection, encouragement, and support.    Diagnosis:   ICD-10-CM   1. Depression, postpartum  O99.345    F53.0     Plan: Not being so stressed with everything on her mind   Treatment Target: Understand the relationship between thoughts, emotions, and behaviors   Psychoeducation on CBT model    Teach the connection between thoughts, emotions, and behaviors  Treatment Target: Increase realistic balanced thinking   Explore patient's thoughts, beliefs, automatic thoughts, assumptions   Identify and replace thinking that leads to depression  Process distress and allow for emotional release   Cognitive reframing   Questioning and challenging thoughts Treatment Target: Reducing vulnerability to "emotional mind"  Values clarification    Self-care - nutrition, sleep, exercise   Increase positive events   Teach and practice mindfulness    Future Appointments  Date Time Provider Department Center  05/22/2019  3:30 PM Kathreen Cosier, LCSW [facility] None    Interpreter used: NA  Kathreen Cosier, LCSW

## 2019-05-22 ENCOUNTER — Ambulatory Visit: Payer: Medicaid Other | Admitting: Licensed Clinical Social Worker

## 2019-05-22 DIAGNOSIS — O99345 Other mental disorders complicating the puerperium: Secondary | ICD-10-CM

## 2019-05-22 DIAGNOSIS — F53 Postpartum depression: Secondary | ICD-10-CM

## 2019-05-22 NOTE — Progress Notes (Signed)
Counselor/Therapist Progress Note  Patient ID: Susan Cantrell, MRN: [id number],    Date: 05/22/2019  Time Spent: 45 minutes   Treatment Type: Individual Therapy  Reported Symptoms: decrease in symptoms  Mental Status Exam:  Appearance:   Casual     Behavior:  Appropriate and Sharing  Motor:  Normal  Speech/Language:   Normal Rate  Affect:  Appropriate and Congruent  Mood:  normal  Thought process:  normal  Thought content:    WNL  Sensory/Perceptual disturbances:    WNL  Orientation:  oriented to person, place, time/date, situation and day of week  Attention:  Good  Concentration:  Good  Memory:  WNL  Fund of knowledge:   Good  Insight:    Good  Judgment:   Good  Impulse Control:  Good   Risk Assessment: Danger to Self:  No Self-injurious Behavior: No Danger to Others: No Duty to Warn:no Physical Aggression / Violence:Yes  Access to Firearms a concern: No  Gang Involvement:No   Subjective: Patient was engaged and cooperative throughout the session using time effectively to discuss thoughts and feelings. Patient voices continued motivation for treatment and  understanding of depression symptoms. Patient is likely to benefit from future treatment because she remains motivated to decrease depression and reports benefit of a combination of regular sessions and medication management in addressing these symptoms.   Interventions: Cognitive Behavioral Therapy  Established psychological safety. Provided supportive space encouraging emotional release and processing of current psychosocial stressors, overall decrease in depressive symptoms.  Explored patient's experience of decrease in depressive symptoms, assisting patient in identifying what has been helping her mood. Discussed strategies to address loneliness, including identifying and addressing barriers to child care options. Reviewed mindfulness and encouraged patient to practice mindfulness strategies. Provided support  through active listening, validation of feelings, and highlighted patient's strengths.   Diagnosis:   ICD-10-CM   1. Depression, postpartum  O99.345    F53.0     Plan: Not being so stressed with everything on her mind  Treatment Target: Understand the relationship between thoughts, emotions, and behaviors   Psychoeducation on CBT model   Teach the connection between thoughts, emotions, and behaviors  Treatment Target: Increase realistic balanced thinking   Explore patient's thoughts, beliefs, automatic thoughts, assumptions   Identify and replace thinking that leads to depression  Process distress and allow for emotional release   Cognitive reframing   Questioning and challenging thoughts Treatment Target: Reducing vulnerability to "emotional mind"  Values clarification   Self-care -nutrition, sleep, exercise   Increase positive events   Teach and practice mindfulness  Future Appointments  Date Time Provider Department Center  05/29/2019  1:30 PM Kathreen Cosier, LCSW [facility] None    Interpreter used: NA   Kathreen Cosier, LCSW

## 2019-05-29 ENCOUNTER — Ambulatory Visit: Payer: Medicaid Other | Admitting: Licensed Clinical Social Worker

## 2019-05-29 DIAGNOSIS — F53 Postpartum depression: Secondary | ICD-10-CM | POA: Insufficient documentation

## 2019-05-29 DIAGNOSIS — O99345 Other mental disorders complicating the puerperium: Secondary | ICD-10-CM

## 2019-05-29 HISTORY — DX: Postpartum depression: F53.0

## 2019-05-29 NOTE — Progress Notes (Signed)
Counselor/Therapist Progress Note  Patient ID: Susan Cantrell, MRN: [id number],    Date: 05/29/2019  Time Spent: 45 minutes   Treatment Type: Individual Therapy  Reported Symptoms: irritability, frustration, depressed mood, mood instability, ruminating thoughts   Mental Status Exam:  Appearance:   Casual     Behavior:  Appropriate and Sharing  Motor:  Normal  Speech/Language:   Normal Rate  Affect:  Appropriate  Mood:  normal  Thought process:  normal  Thought content:    WNL  Sensory/Perceptual disturbances:    WNL  Orientation:  oriented to person, place, time/date, situation and day of week  Attention:  Good  Concentration:  Good  Memory:  WNL  Fund of knowledge:   Good  Insight:    Good  Judgment:   Good  Impulse Control:  Good   Risk Assessment: Danger to Self:  No Self-injurious Behavior: No Danger to Others: No Duty to Warn:no Physical Aggression / Violence:No  Access to Firearms a concern: No  Gang Involvement:No   Subjective: Patient was engaged and cooperative throughout the session using time effectively to discuss thoughts and feelings. Patient voices continued motivation for treatment and understanding of depression. Patient is likely to benefit from future treatment because she remains motivated to decrease depression and reports benefit of the combination of medication management and regular sessions in addressing these symptoms.   Interventions: Cognitive Behavioral Therapy  Established psychological safety. Provided supportive space encouraging emotional release and processing of current psychosocial stressors continued depressive symptoms with improvement since initiation of treatment (talk therapy and med management) due to postpartum and relationship challenges with father of baby.  Validated patient's feelings of frustration and sadness with father of baby. Assisted patient in weighing pros and cons of going to talk with father of baby. Discussed use  of mindfulness acceptance practices each night in times of loneliness; also encouraged patient to continue to engage in positive supportive relationships. LCSW encouraged patient to discuss the possibility of increasing dosage for Zoloft with provider - mood improvement, but continues to have depressive symptoms.   Provided support through active listening, validation of feelings, and highlighted patient's strengths.    Diagnosis:   ICD-10-CM   1. Depression, postpartum  O99.345    F53.0     Plan: Not being so stressed with everything on her mind  Treatment Target: Understand the relationship between thoughts, emotions, and behaviors   Psychoeducation on CBT model   Teach the connection between thoughts, emotions, and behaviors  Treatment Target: Increase realistic balanced thinking   Explore patient's thoughts, beliefs, automatic thoughts, assumptions   Identify and replace thinking that leads to depression  Process distress and allow for emotional release   Cognitive reframing   Questioning and challenging thoughts Treatment Target: Reducing vulnerability to "emotional mind"  Values clarification   Self-care -nutrition, sleep, exercise   Increase positive events   Teach and practice mindfulness  Future Appointments  Date Time Provider Department Center  06/05/2019  3:00 PM Kathreen Cosier, LCSW [facility] None    Interpreter used: NA   Kathreen Cosier, LCSW

## 2019-06-05 ENCOUNTER — Ambulatory Visit: Payer: Medicaid Other | Admitting: Licensed Clinical Social Worker

## 2019-06-05 DIAGNOSIS — O99345 Other mental disorders complicating the puerperium: Secondary | ICD-10-CM

## 2019-06-05 DIAGNOSIS — F53 Postpartum depression: Secondary | ICD-10-CM

## 2019-06-05 NOTE — Progress Notes (Signed)
Counselor/Therapist Progress Note  Patient ID: NATOSHIA SOUTER, MRN: [id number],    Date: 06/05/2019  Time Spent: 26 minutes   Treatment Type: Individual Therapy  Reported Symptoms: overall decrease in depressive symptoms  Mental Status Exam:  Appearance:   Casual     Behavior:  Appropriate and Sharing  Motor:  Normal  Speech/Language:   Normal Rate  Affect:  Appropriate and Congruent  Mood:  normal  Thought process:  normal  Thought content:    WNL  Sensory/Perceptual disturbances:    WNL  Orientation:  oriented to person, place, time/date, situation and day of week  Attention:  Good  Concentration:  Good  Memory:  WNL  Fund of knowledge:   Good  Insight:    Good  Judgment:   Good  Impulse Control:  Good   Risk Assessment: Danger to Self:  No Self-injurious Behavior: No Danger to Others: No Duty to Warn:no Physical Aggression / Violence:No  Access to Firearms a concern: No  Gang Involvement:No   Subjective: Patient was engaged and cooperative throughout the session using time effectively to discuss thoughts and feelings. Patient voices continued motivation for treatment and understanding of depression and anxiety issues. Patient is likely to benefit from future treatment because she remains motivated to decrease depression and reports benefit from the combination of medication and regular sessions in addressing these symptoms.    Interventions: Cognitive Behavioral Therapy  Established psychological safety.  Engaged patient in processing current psychosocial stressors, continued depression/anxiety with overall improvement, relationship conflict with father of baby. Normalized patient's experience of sadness and frustration. Reviewed coping skills to identify, accept and manage symptoms. Encouraged patient to download [facility] app. And [employer] and to continue to engage in positive relationships. Provided support through active listening, validation of feelings, and highlighted  patient's strengths.   Diagnosis:   ICD-10-CM   1. Depression, postpartum  O99.345    F53.0     Plan: Not being so stressed with everything on her mind  Treatment Target: Understand the relationship between thoughts, emotions, and behaviors   Psychoeducation on CBT model   Teach the connection between thoughts, emotions, and behaviors  Treatment Target: Increase realistic balanced thinking   Explore patient's thoughts, beliefs, automatic thoughts, assumptions   Identify and replace thinking that leads to depression  Process distress and allow for emotional release   Cognitive reframing   Questioning and challenging thoughts Treatment Target: Reducing vulnerability to "emotional mind"  Values clarification   Self-care -nutrition, sleep, exercise   Increase positive events   Teach and practice mindfulness  Future Appointments  Date Time Provider Department Center  06/12/2019  9:00 AM Kathreen Cosier, LCSW [facility] None    Interpreter used: NA   Kathreen Cosier, LCSW

## 2019-06-12 ENCOUNTER — Ambulatory Visit: Payer: Medicaid Other | Admitting: Licensed Clinical Social Worker

## 2019-06-13 ENCOUNTER — Telehealth: Payer: Self-pay | Admitting: Licensed Clinical Social Worker

## 2019-06-13 NOTE — Telephone Encounter (Signed)
-----   Message from Kathreen Cosier, [place] sent at 06/11/2019 10:59 AM EDT ----- Regarding: reschedule Reschedule patient.

## 2019-06-25 ENCOUNTER — Ambulatory Visit: Payer: Medicaid Other | Admitting: Licensed Clinical Social Worker

## 2019-06-25 DIAGNOSIS — O99345 Other mental disorders complicating the puerperium: Secondary | ICD-10-CM

## 2019-06-25 DIAGNOSIS — F53 Postpartum depression: Secondary | ICD-10-CM

## 2019-06-25 NOTE — Progress Notes (Signed)
Counselor/Therapist Progress Note  Patient ID: Susan Cantrell, MRN: [id number],    Date: 06/25/2019  Time Spent:  Patient arrived late for appt. 45 minutes   Treatment Type: Individual Therapy  Reported Symptoms: Obsessive thinking and anxiety, overwhelm; low mood at times  Mental Status Exam:  Appearance:   Casual     Behavior:  Appropriate and Sharing  Motor:  Normal  Speech/Language:   Normal Rate  Affect:  Appropriate  Mood:  normal  Thought process:  normal  Thought content:    WNL  Sensory/Perceptual disturbances:    WNL  Orientation:  oriented to person, place, time/date, situation, day of week and month of year  Attention:  Good  Concentration:  Good  Memory:  WNL  Fund of knowledge:   Good  Insight:    Good  Judgment:   Good  Impulse Control:  Good   Risk Assessment: Danger to Self:  No Self-injurious Behavior: No Danger to Others: No Duty to Warn:no Physical Aggression / Violence:No  Access to Firearms a concern: No  Gang Involvement:No   Subjective: Patient was engaged and cooperative throughout the session using time effectively to discuss thoughts and feelings. Patient voices continued motivation for treatment and understanding of depression issues. Patient is likely to benefit from future treatment because she remains motivated to decrease depression and reports benefit of the combination of regular sessions and medication management in addressing symptoms.   Interventions: Cognitive Behavioral Therapy  Established psychological safety. Checked in with patient and engaged her in processing current psychosocial stressors continued symptoms of mild depression; relationship challenges with ex / father of baby. Validated patient's feelings of anger and frustration toward FOB. Explored patient's feelings of loneliness and overwhelm, discussing use of mindfulness and accurate self-statements. Encouraged patient to download [facility] - mindfulness app. Also began  discussing patient creating a mental health first aid kit with positive affirmations and other coping reminders to help self-sooth during times when she has increased distress. Encouraged patient to continue to engage in positive peer relationships.  Provided support through active listening, validation of feelings, and highlighted patient's strengths.   Diagnosis:   ICD-10-CM   1. Depression, postpartum  O99.345    F53.0     Plan: Not being so stressed with everything on her mind  Treatment Target: Understand the relationship between thoughts, emotions, and behaviors   Psychoeducation on CBT model   Teach the connection between thoughts, emotions, and behaviors  Treatment Target: Increase realistic balanced thinking   Explore patient's thoughts, beliefs, automatic thoughts, assumptions   Identify and replace thinking that leads to depression  Process distress and allow for emotional release   Cognitive reframing   Questioning and challenging thoughts Treatment Target: Reducing vulnerability to "emotional mind"  Values clarification   Self-care -nutrition, sleep, exercise   Increase positive events   Teach and practice mindfulness  Future Appointments  Date Time Provider [place]  07/03/2019  2:00 PM Milton Ferguson, LCSW [facility] None   Interpreter used: NA  Milton Ferguson, LCSW

## 2019-07-03 ENCOUNTER — Ambulatory Visit: Payer: Medicaid Other | Admitting: Licensed Clinical Social Worker

## 2019-07-03 DIAGNOSIS — F53 Postpartum depression: Secondary | ICD-10-CM

## 2019-07-03 DIAGNOSIS — O99345 Other mental disorders complicating the puerperium: Secondary | ICD-10-CM

## 2019-07-03 NOTE — Progress Notes (Signed)
Counselor/Therapist Progress Note  Patient ID: Susan Cantrell, MRN: [id number],    Date: 07/03/2019  Time Spent: 45 minutes   Treatment Type: Individual Therapy  Reported Symptoms: low mood, irritability, ruminating thoughts  Mental Status Exam:  Appearance:   Casual     Behavior:  Appropriate and Sharing  Motor:  Normal  Speech/Language:   Normal Rate  Affect:  Appropriate  Mood:  dysthymic  Thought process:  normal  Thought content:    WNL  Sensory/Perceptual disturbances:    WNL  Orientation:  oriented to person, place, time/date, situation and day of week  Attention:  Good  Concentration:  Good  Memory:  WNL  Fund of knowledge:   Good  Insight:    Good  Judgment:   Good  Impulse Control:  Good   Risk Assessment: Danger to Self:  No Self-injurious Behavior: No Danger to Others: No Duty to Warn:no Physical Aggression / Violence:No  Access to Firearms a concern: No  Gang Involvement:No   Subjective: Patient was engaged and cooperative throughout the session using time effectively to discuss thoughts and feelings. Patient voices continued motivation for treatment and understanding of postpartum depression due to circumstances. Patient is likely to benefit from future treatment because she remains motivated to decrease depression and reports benefit of the combination of med management and regular sessions in addressing symptoms.    Interventions: Cognitive Behavioral Therapy  Established psychological safety. Engaged patient in processing current psychosocial stressors, continued depression/anxiety due to relationship challenges with father of baby. Explored patient's perception of current challenges, validating patient's feelings of frustration and anger. Assisted patient in weighing out pros and cons of current plan to deal with the challenges. Encouraged patient to delay responses when she has high emotions. Had patient download [facility] app and encouraged her to begin  using. Also encouraged continued engagement with positive peers. Provided support through active listening, validation of feelings, and highlighted patient's strengths.   Diagnosis:   ICD-10-CM   1. Depression, postpartum  O99.345    F53.0    Plan: Not being so stressed with everything on her mind  Treatment Target: Understand the relationship between thoughts, emotions, and behaviors   Psychoeducation on CBT model   Teach the connection between thoughts, emotions, and behaviors  Treatment Target: Increase realistic balanced thinking   Explore patients thoughts, beliefs, automatic thoughts, assumptions   Identify and replace thinking that leads to depression  Process distress and allow for emotional release   Cognitive reframing   Questioning and challenging thoughts Treatment Target: Reducing vulnerability to emotional mind  Values clarification   Self-care -nutrition, sleep, exercise   Increase positive events   Teach and practice mindfulness  Future Appointments  Date Time Provider Department Center  07/11/2019 12:30 PM Kathreen Cosier, LCSW [facility] None   Interpreter used: NA  Kathreen Cosier, LCSW

## 2019-07-11 ENCOUNTER — Ambulatory Visit: Payer: Medicaid Other | Admitting: Licensed Clinical Social Worker

## 2019-07-12 ENCOUNTER — Ambulatory Visit: Payer: Medicaid Other | Admitting: Licensed Clinical Social Worker

## 2019-07-12 DIAGNOSIS — F53 Postpartum depression: Secondary | ICD-10-CM

## 2019-07-12 DIAGNOSIS — O99345 Other mental disorders complicating the puerperium: Secondary | ICD-10-CM

## 2019-07-12 NOTE — Progress Notes (Signed)
Counselor/Therapist Progress Note  Patient ID: Susan Cantrell, MRN: [id number],    Date: 07/12/2019  Time Spent: 45 minutes    Treatment Type: Individual Therapy  Reported Symptoms: mild depressive symptoms, moments of low mood, frustration, and ruminating thoughts   Mental Status Exam:  Appearance:   Casual     Behavior:  Appropriate and Sharing  Motor:  Normal  Speech/Language:   Normal Rate  Affect:  Appropriate and Congruent  Mood:  normal  Thought process:  normal  Thought content:    WNL  Sensory/Perceptual disturbances:    WNL  Orientation:  oriented to person, place, time/date and situation  Attention:  Good  Concentration:  Good  Memory:  WNL  Fund of knowledge:   Good  Insight:    Good  Judgment:   Good  Impulse Control:  Good   Risk Assessment: Danger to Self:  No Self-injurious Behavior: No Danger to Others: No Duty to Warn:no Physical Aggression / Violence:No  Access to Firearms a concern: No  Gang Involvement:No   Subjective: Patient was engaged and cooperative throughout the session using time effectively to discuss thoughts and feelings.  Patient voices continued motivation for treatment and understanding of depression. Patient is likely to benefit from future treatment because she remains motivated to decrease depression symptoms and reports benefit of the combination of regular sessions and medication management in addressing these symptoms.    Interventions: Cognitive Behavioral Therapy  Established psychological safety. Checked in with patient regarding her week. Reviewed previous session regarding use of mindfulness app - [employer].  Actively listened to patient express concerns, validated patient's feelings of stress and assisted patient in identifying what is going well. Encouraged patient to continue to use mindfulness app. And to engage in positive supportive relationships.   Diagnosis:   ICD-10-CM   1. Depression, postpartum  O99.345    F53.0     Plan: Not being so stressed with everything on her mind  Treatment Target: Understand the relationship between thoughts, emotions, and behaviors   Psychoeducation on CBT model   Teach the connection between thoughts, emotions, and behaviors  Treatment Target: Increase realistic balanced thinking   Explore patients thoughts, beliefs, automatic thoughts, assumptions   Identify and replace thinking that leads to depression  Process distress and allow for emotional release   Cognitive reframing   Questioning and challenging thoughts Treatment Target: Reducing vulnerability to emotional mind  Values clarification   Self-care -nutrition, sleep, exercise   Increase positive events   Teach and practice mindfulness  Future Appointments  Date Time Provider Department Center  07/23/2019  9:00 AM Kathreen Cosier, LCSW [facility] None   Interpreter used: NA  Kathreen Cosier, LCSW

## 2019-07-23 ENCOUNTER — Ambulatory Visit: Payer: Medicaid Other | Admitting: Licensed Clinical Social Worker

## 2019-07-23 DIAGNOSIS — F53 Postpartum depression: Secondary | ICD-10-CM

## 2019-07-23 DIAGNOSIS — O99345 Other mental disorders complicating the puerperium: Secondary | ICD-10-CM

## 2019-07-23 NOTE — Progress Notes (Signed)
Counselor/Therapist Progress Note  Patient ID: Susan Cantrell, MRN: [id number],    Date: 07/23/2019  Time Spent: 47 minutes   Treatment Type: Individual Therapy  Reported Symptoms: mild anxiety, worries, irritability  Mental Status Exam:  Appearance:   Casual     Behavior:  Appropriate and Sharing  Motor:  Normal  Speech/Language:   Normal Rate  Affect:  Appropriate and Congruent  Mood:  normal  Thought process:  normal  Thought content:    WNL  Sensory/Perceptual disturbances:    WNL  Orientation:  oriented to person, place, time/date, situation and day of week  Attention:  Good  Concentration:  Good  Memory:  WNL  Fund of knowledge:   Good  Insight:    Good  Judgment:   Good  Impulse Control:  Good   Risk Assessment: Danger to Self:  No Self-injurious Behavior: No Danger to Others: No Duty to Warn:no Physical Aggression / Violence:No  Access to Firearms a concern: No  Gang Involvement:No   Subjective: Patient was engaged and cooperative throughout the session using time effectively to discuss thoughts and feelings. Patient voices continued motivation for treatment and understanding of depression issues. Patient is likely to benefit from future treatment because she remains motivated to decrease depression symptoms and reports benefit of regular sessions in addressing these symptoms.    Interventions: Cognitive Behavioral Therapy  Established psychological safety. Checked in with patient and engaged her in processing current psychosocial stressors, over all mood improvement with continued anxiousness and worries; multiple stressors. Reviewed mindfulness strategies and using your five senses through every day tasks. Provided support through active listening, validation of feelings, and highlighted patient's strengths.   Diagnosis:   ICD-10-CM   1. Depression, postpartum  O99.345    F53.0    Plan: Not being so stressed with everything on her mind  Treatment  Target: Understand the relationship between thoughts, emotions, and behaviors   Psychoeducation on CBT model   Teach the connection between thoughts, emotions, and behaviors  Treatment Target: Increase realistic balanced thinking   Explore patient's thoughts, beliefs, automatic thoughts, assumptions   Identify and replace thinking that leads to depression  Process distress and allow for emotional release   Cognitive reframing   Questioning and challenging thoughts Treatment Target: Reducing vulnerability to "emotional mind"  Values clarification   Self-care -nutrition, sleep, exercise   Increase positive events   Teach and practice mindfulness  Future Appointments  Date Time Provider Department Center  07/31/2019  5:00 PM Kathreen Cosier, LCSW [facility] None    Interpreter used: NA  Kathreen Cosier, LCSW

## 2019-07-31 ENCOUNTER — Ambulatory Visit: Payer: Medicaid Other | Admitting: Licensed Clinical Social Worker

## 2019-07-31 DIAGNOSIS — O99345 Other mental disorders complicating the puerperium: Secondary | ICD-10-CM

## 2019-07-31 DIAGNOSIS — F53 Postpartum depression: Secondary | ICD-10-CM

## 2019-07-31 NOTE — Progress Notes (Signed)
Counselor/Therapist Progress Note  Patient ID: Susan Cantrell, MRN: [id number],    Date: 07/31/2019  Time Spent: 40 minutes   Treatment Type: Individual Therapy  Reported Symptoms: mild depressive symptoms   Mental Status Exam:  Appearance:   Casual     Behavior:  Appropriate and Sharing  Motor:  Normal  Speech/Language:   Normal Rate  Affect:  Appropriate and Congruent  Mood:  normal  Thought process:  normal  Thought content:    WNL  Sensory/Perceptual disturbances:    WNL  Orientation:  oriented to person, place, time/date, situation and day of week  Attention:  Good  Concentration:  Good  Memory:  WNL  Fund of knowledge:   Good  Insight:    Good  Judgment:   Good  Impulse Control:  Good   Risk Assessment: Danger to Self:  No Self-injurious Behavior: No Danger to Others: No Duty to Warn:no Physical Aggression / Violence:No  Access to Firearms a concern: No  Gang Involvement:No   Subjective: Patient was engaged and cooperative throughout the session using time effectively to discuss thoughts and feelings. Patient voices continued motivation for treatment and understanding of depression and anxiety symptoms. Patient is likely to benefit from future treatment because she is motivated to continue to decrease symptoms and improve functioning and reports benefit of regular sessions.    Interventions: Cognitive Behavioral Therapy  Established psychological safety. Checked in with patient regarding her week. Actively listened to patient voice concerns related to multiple stressors, validatied patient's feelings of overwhelm, sadness, and frustration. Reviewed the need for continued self-care and engagement in positive supportive relationships. Provided support through active listening, validation of feelings, and highlighted patient's strengths.   Diagnosis:   ICD-10-CM   1. Depression, postpartum  O99.345    F53.0    Plan: Not being so stressed with everything on her  mind  Treatment Target: Increase realistic balanced thinking   Explore patient's thoughts, beliefs, automatic thoughts, assumptions   Identify and replace thinking that leads to depression  Process distress and allow for emotional release   Cognitive reframing   Questioning and challenging thoughts Treatment Target: Reducing vulnerability to "emotional mind"  Values clarification   Self-care -nutrition, sleep, exercise   Increase positive events   Teach and practice mindfulness  Future Appointments  Date Time Provider Department Center  08/14/2019  1:00 PM Kathreen Cosier, LCSW [facility] None   Interpreter used: NA  Kathreen Cosier, LCSW

## 2019-08-14 ENCOUNTER — Ambulatory Visit: Payer: Medicaid Other | Admitting: Licensed Clinical Social Worker

## 2019-08-22 ENCOUNTER — Ambulatory Visit: Payer: Medicaid Other | Admitting: Licensed Clinical Social Worker

## 2019-08-22 DIAGNOSIS — F53 Postpartum depression: Secondary | ICD-10-CM

## 2019-08-22 DIAGNOSIS — O99345 Other mental disorders complicating the puerperium: Secondary | ICD-10-CM

## 2019-08-22 NOTE — Progress Notes (Signed)
Counselor/Therapist Progress Note  Patient ID: Susan Cantrell, MRN: [id number],    Date: 08/22/2019  Time Spent: 45 minutes  Treatment Type: Individual Therapy  Reported Symptoms: anxiety, feelings of overwhelm, anxious thoughts; low mood and sadness at times  Mental Status Exam:  Appearance:   Casual     Behavior:  Appropriate and Sharing  Motor:  Normal  Speech/Language:   Normal Rate  Affect:  Appropriate and Congruent  Mood:  normal  Thought process:  normal  Thought content:    WNL  Sensory/Perceptual disturbances:    WNL  Orientation:  oriented to person, place, time/date, situation and day of week  Attention:  Good  Concentration:  Good  Memory:  WNL  Fund of knowledge:   Good  Insight:    Good  Judgment:   Good  Impulse Control:  Good   Risk Assessment: Danger to Self:  No Self-injurious Behavior: No Danger to Others: No Duty to Warn:no Physical Aggression / Violence:No  Access to Firearms a concern: No  Gang Involvement:No   Subjective: Patient was engaged and cooperative throughout the session using time effectively to discuss thoughts and feelings. Patient voices continued motivation for treatment and understanding of CBTs to manage symptoms. Patient is likely to benefit from future treatment because she remains motivated to decrease depression/anxiety and reports benefit of regular sessions in addressing these symptoms.    Interventions: Cognitive Behavioral Therapy Established psychological safety. Checked in with patient regarding her week. Followed up on previous session and provided contact information on [facility] - to assist with relocation.  Engaged patient in processing current psychosocial stressors, mild depression/anxiety due to financial stressors, work and challenges with father of baby. Explored patient's perceptions of these challenges, highlighting unhelpful thoughts and reframing these thoughts. Assessed alcohol usage. Reviewed  self-talk and mindfulness strategies to manage depression/anxiety symptoms. Encouraged continued engagement in positive peer relationships.  Provided support through active listening, validation of feelings, and highlighted patient's strengths.   Diagnosis:   ICD-10-CM   1. Depression, postpartum  O99.345    F53.0    Plan: Not being so stressed with everything on her mind  Treatment Target: Increase realistic balanced thinking   Explore patient's thoughts, beliefs, automatic thoughts, assumptions   Identify and replace thinking that leads to depression  Process distress and allow for emotional release   Cognitive reframing   Questioning and challenging thoughts Treatment Target: Reducing vulnerability to "emotional mind"  Values clarification   Self-care -nutrition, sleep, exercise   Increase positive events   Teach and practice mindfulness  Future Appointments  Date Time Provider Department Center  08/29/2019 12:10 PM Kathreen Cosier, LCSW [facility] None    Interpreter used: NA   Kathreen Cosier, LCSW

## 2019-08-29 ENCOUNTER — Ambulatory Visit: Payer: Medicaid Other | Admitting: Licensed Clinical Social Worker

## 2019-08-29 DIAGNOSIS — O99345 Other mental disorders complicating the puerperium: Secondary | ICD-10-CM

## 2019-08-29 DIAGNOSIS — F53 Postpartum depression: Secondary | ICD-10-CM

## 2019-08-29 NOTE — Progress Notes (Signed)
Counselor/Therapist Progress Note  Patient ID: Susan Cantrell, MRN: [id number],    Date: 08/29/2019  Time Spent: 45 minutes     Treatment Type: Individual Therapy  Reported Symptoms: low mood, feelings of overwhelm, worries, anxious thoughts  Mental Status Exam:  Appearance:   Casual and Neat     Behavior:  Appropriate and Sharing  Motor:  Normal  Speech/Language:   Normal Rate  Affect:  NA and Non-Congruent  Mood:  dysthymic and sad  Thought process:  normal  Thought content:    WNL  Sensory/Perceptual disturbances:    WNL  Orientation:  oriented to person, place, time/date, situation and day of week  Attention:  Good  Concentration:  Good  Memory:  WNL  Fund of knowledge:   Good  Insight:    Good  Judgment:   Good  Impulse Control:  Good   Risk Assessment: Danger to Self:  No Self-injurious Behavior: No Danger to Others: No Duty to Warn:no Physical Aggression / Violence:No  Access to Firearms a concern: No  Gang Involvement:No   Subjective: Patient was engaged and cooperative throughout the session using time effectively to discuss thoughts and feelings. Patient voices continued motivation for treatment and understanding of depression and anxiety issues. Patient is likely to benefit from future treatment because she remains motivated to decrease depression/anxiety and reports benefit of regular sessions in addressing these symptoms.   Interventions: Cognitive Behavioral Therapy Established psychological safety. Checked in with patient regarding her week. Provided supportive space encouraging emotional release and processing of current psychosocial stressors, increased depressive symptoms and anxiety due to daily life challenges and grief over ending of relationship. Validated patient feelings of sadness and overwhelm, identfying origins of these feelings. Assisted patient in identifying ways to increase support and balance daily tasks. Discussed gratitude and gratitude  statements. Encouraged patient to notice self-talk and to identify things she has gratitude for daily. Discussed compliance with med management. Provided support through active listening, validation of feelings, and highlighted patient's strengths.    Diagnosis:   ICD-10-CM   1. Depression, postpartum  O99.345    F53.0    Plan: Not being so stressed with everything on her mind  Treatment Target: Increase realistic balanced thinking   Explore patient's thoughts, beliefs, automatic thoughts, assumptions   Identify and replace thinking that leads to depression  Process distress and allow for emotional release   Cognitive reframing   Questioning and challenging thoughts Treatment Target: Reducing vulnerability to "emotional mind"  Values clarification   Self-care -nutrition, sleep, exercise   Increase positive events   Teach and practice mindfulness  Gratitude statements   Future Appointments  Date Time Provider Department Center  09/10/2019  3:00 PM Kathreen Cosier, LCSW [facility] None   Interpreter used: NA  Kathreen Cosier, LCSW

## 2019-09-10 ENCOUNTER — Ambulatory Visit: Payer: Medicaid Other | Admitting: Licensed Clinical Social Worker

## 2019-09-18 ENCOUNTER — Ambulatory Visit: Payer: Medicaid Other | Admitting: Licensed Clinical Social Worker

## 2019-09-18 DIAGNOSIS — F53 Postpartum depression: Secondary | ICD-10-CM

## 2019-09-18 DIAGNOSIS — O99345 Other mental disorders complicating the puerperium: Secondary | ICD-10-CM

## 2019-09-18 NOTE — Progress Notes (Signed)
Counselor/Therapist Progress Note  Patient ID: Susan Cantrell, MRN: [id number],    Date: 09/18/2019  Time Spent: 50 minutes   Treatment Type: Individual Therapy  Reported Symptoms: depressed mood, anxiety, anxious thoughts   Mental Status Exam:  Appearance:   Casual     Behavior:  Appropriate and Sharing  Motor:  Normal  Speech/Language:   Normal Rate  Affect:  Appropriate and Congruent  Mood:  dysthymic  Thought process:  normal  Thought content:    WNL  Sensory/Perceptual disturbances:    WNL  Orientation:  oriented to person, place, time/date, situation and day of week  Attention:  Good  Concentration:  Good  Memory:  WNL  Fund of knowledge:   Good  Insight:    Good  Judgment:   Good  Impulse Control:  Good   Risk Assessment: Danger to Self:  No Self-injurious Behavior: No Danger to Others: No Duty to Warn:no Physical Aggression / Violence:No  Access to Firearms a concern: No  Gang Involvement:No   Subjective: Patient was engaged and cooperative throughout the session using time effectively to discuss thoughts and feelings. Patient voices continued motivation for treatment and understanding of depression/anxiety issues. Patient is likely to benefit from future treatment because she remains motivated to decrease symptoms and improve functioning and reports benefit of regular sessions.    Interventions: Cognitive Behavioral Therapy  Established psychological safety. Checked in with patient regarding her week. Explored patient's current psychosocial stressors, validating patient's feelings of overwhelm. Taught patient importance of compliance with med management and explored options for patient to remember to take daily. Discussed barriers that patient is facing when trying to seek housing resources through [facility] and [facility]. LCSW agreed to try to follow up with [facility] and [facility]. Encouraged patient to use mindfulness app. and to get outside. Provided support through  active listening, validation of feelings, and highlighted patient's strengths.    Diagnosis:   ICD-10-CM   1. Depression, postpartum  O99.345    F53.0     Plan: Not being so stressed with everything on her mind  Treatment Target: Increase realistic balanced thinking   Explore patient's thoughts, beliefs, automatic thoughts, assumptions   Identify and replace thinking that leads to depression  Process distress and allow for emotional release   Cognitive reframing   Questioning and challenging thoughts Treatment Target: Reducing vulnerability to "emotional mind"  Values clarification   Self-care -nutrition, sleep, exercise   Increase positive events   Teach and practice mindfulness  Gratitude statements   Future Appointments  Date Time Provider Department Center  10/04/2019  1:00 PM Kathreen Cosier, LCSW [facility] None    Interpreter used: NA  Kathreen Cosier, LCSW

## 2019-10-04 ENCOUNTER — Ambulatory Visit: Payer: Medicaid Other | Admitting: Licensed Clinical Social Worker

## 2019-10-04 DIAGNOSIS — O99345 Other mental disorders complicating the puerperium: Secondary | ICD-10-CM

## 2019-10-04 DIAGNOSIS — F53 Postpartum depression: Secondary | ICD-10-CM
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
## 2019-10-15 ENCOUNTER — Ambulatory Visit: Payer: Medicaid Other | Admitting: Licensed Clinical Social Worker

## 2019-10-15 DIAGNOSIS — F53 Postpartum depression: Secondary | ICD-10-CM

## 2019-10-15 DIAGNOSIS — O99345 Other mental disorders complicating the puerperium: Secondary | ICD-10-CM

## 2019-10-15 NOTE — Progress Notes (Signed)
Counselor/Therapist Progress Note  Patient ID: Susan Cantrell, MRN: [id number],    Date: 10/15/2019  Time Spent: 45 minutes    Treatment Type: Individual Therapy  Reported Symptoms: Obsessive thinking, Sleep disturbance and anxious, anxious thoughts, nighmares, hyper arousal   Mental Status Exam:  Appearance:   Casual     Behavior:  Appropriate and Sharing  Motor:  Normal  Speech/Language:   Normal Rate  Affect:  Appropriate and Congruent  Mood:  dysthymic  Thought process:  normal  Thought content:    WNL  Sensory/Perceptual disturbances:    WNL  Orientation:  oriented to person, place, time/date, situation and day of week  Attention:  Good  Concentration:  Good  Memory:  WNL  Fund of knowledge:   Good  Insight:    Good  Judgment:   Good  Impulse Control:  Good   Risk Assessment: Danger to Self:  No Self-injurious Behavior: No Danger to Others: No Duty to Warn:no Physical Aggression / Violence:No  Access to Firearms a concern: No  Gang Involvement:No   Subjective: Patient was engaged and cooperative throughout the session using time effectively to discuss thoughts and feelings. Patient voices continued motivation for treatment and understanding of depression and anxiety issues. Patient is likely to benefit from future treatment because she remains motivated to decrease depression and anxiety and reports benefit of regular sessions in addressing these symptoms.    Interventions: Cognitive Behavioral Therapy Established psychological safety. Checked in with patient regarding her week. Reviewed previous session regarding safety issues and housing; breastfeeding. Encouraged patient to continue to reach out to [facility]. Discussed patient's progress in leaning into acceptance of breastfeeding experience. Discussed patient's symptoms and taught patient about Trauma, led patient in Box breathing exercise. Encouraged patient to continue intentional breathing exercises to regulate  emotions. Provided support through active listening, validation of feelings, and highlighted patient's strengths.   Diagnosis:   ICD-10-CM   1. Depression, postpartum  O99.345    F53.0    Plan: Not being so stressed with everything on her mind  Treatment Target: Increase realistic balanced thinking   Explore patient's thoughts, beliefs, automatic thoughts, assumptions   Identify and replace thinking that leads to depression  Process distress and allow for emotional release   Cognitive reframing   Questioning and challenging thoughts Treatment Target: Reducing vulnerability to "emotional mind"  Values clarification   Self-care -nutrition, sleep, exercise   Increase positive events   Teach and practice mindfulness  Gratitude statements  Problem solving safe housing options  Future Appointments  Date Time Provider Department Center  10/23/2019 10:00 AM Kathreen Cosier, LCSW [facility] None    Interpreter used: NA  Kathreen Cosier, LCSW

## 2019-10-23 ENCOUNTER — Ambulatory Visit: Payer: Medicaid Other | Admitting: Licensed Clinical Social Worker

## 2019-10-23 DIAGNOSIS — F53 Postpartum depression: Secondary | ICD-10-CM

## 2019-10-23 DIAGNOSIS — O99345 Other mental disorders complicating the puerperium: Secondary | ICD-10-CM

## 2019-10-23 NOTE — Progress Notes (Signed)
Counselor/Therapist Progress Note  Patient ID: Susan Cantrell, MRN: [id number],    Date: 10/23/2019  Time Spent: 37 minutes   Treatment Type: Individual Therapy  Reported Symptoms: Obsessive thinking, Sleep disturbance and anxiety, anxious thoughts, low mood   Mental Status Exam:  Appearance:   NA     Behavior:  Appropriate and Sharing  Motor:  na  Speech/Language:   Normal Rate  Affect:  NA  Mood:  angry  Thought process:  normal  Thought content:    WNL  Sensory/Perceptual disturbances:    WNL  Orientation:  oriented to person, place, time/date, situation and day of week  Attention:  Good  Concentration:  Good  Memory:  WNL  Fund of knowledge:   Good  Insight:    Good  Judgment:   Good  Impulse Control:  Good   Risk Assessment: Danger to Self:  No Self-injurious Behavior: No Danger to Others: No Duty to Warn:no Physical Aggression / Violence:No  Access to Firearms a concern: No  Gang Involvement:No   Subjective: Patient was engaged and cooperative throughout the session using time effectively to discuss thoughts and feelings. Patient voices continued motivation for treatment and understanding of depression and anxiety issues.  Patient is likely to benefit from future treatment because she remains motivated to decrease depression and anxiety symptoms and reports benefit of regular sessions in addressing these symptoms.   Interventions: Cognitive Behavioral Therapy Established psychological safety. Checked in with patient regarding her week. Reviewed previous session regarding housing situation; breastfeeding providing space for patient to verbally ventilate. Discussed patient's anxious thoughts encouraging her to follow up with prescriber to increase dosage of Zoloft. Reviewed strategies to manage anxiety, including mindfulness and self-care. Provided support through active listening, validation of feelings, and highlighted patient's strengths.   Diagnosis:    ICD-10-CM   1. Depression, postpartum  O99.345    F53.0    Plan: Not being so stressed with everything on her mind  Treatment Target: Increase realistic balanced thinking   Explore patient's thoughts, beliefs, automatic thoughts, assumptions   Identify and replace thinking that leads to depression  Process distress and allow for emotional release   Cognitive reframing   Questioning and challenging thoughts Treatment Target: Reducing vulnerability to "emotional mind"  Values clarification   Self-care -nutrition, sleep, exercise   Increase positive events   Teach and practice mindfulness  Gratitude statements  Problem solving safe housing options  Future Appointments  Date Time Provider Department Center  11/12/2019  1:00 PM Kathreen Cosier, LCSW [facility] None    Interpreter used: NA  Kathreen Cosier, LCSW

## 2019-11-12 ENCOUNTER — Ambulatory Visit: Payer: Medicaid Other | Admitting: Licensed Clinical Social Worker

## 2019-11-12 DIAGNOSIS — O99345 Other mental disorders complicating the puerperium: Secondary | ICD-10-CM

## 2019-11-12 DIAGNOSIS — F53 Postpartum depression: Secondary | ICD-10-CM

## 2019-11-12 NOTE — Progress Notes (Signed)
Counselor/Therapist Progress Note  Patient ID: Susan Cantrell, MRN: [id number],    Date: 11/12/2019  Time Spent: 45 minutes    Treatment Type: Individual Therapy  Reported Symptoms: overall mood stability; mild anxiety, anxious thoughts   Mental Status Exam:  Appearance:   Casual and Well Groomed     Behavior:  Appropriate and Sharing  Motor:  Normal  Speech/Language:   Normal Rate  Affect:  Appropriate and Congruent  Mood:  euthymic  Thought process:  normal  Thought content:    WNL  Sensory/Perceptual disturbances:    WNL  Orientation:  oriented to person, place and time/date  Attention:  Good  Concentration:  Good  Memory:  WNL  Fund of knowledge:   Good  Insight:    Good  Judgment:   Good  Impulse Control:  Good   Risk Assessment: Danger to Self:  No Self-injurious Behavior: No Danger to Others: No Duty to Warn:no Physical Aggression / Violence:No  Access to Firearms a concern: No  Gang Involvement:No   Subjective: Patient was engaged and cooperative throughout the session using time effectively to discuss thoughts and feelings and safety issues along with plans to move this week. Patient voices continued motivation for treatment and understanding of depression/anxiety issues. Patient is likely to benefit from future treatment because she remains motivated to manage symptoms and improve functioning and reports benefit of regular sessions.   Interventions: Cognitive Behavioral Therapy and case management tasks Established psychological safety. Provided supportive space encouraging emotional release and processing of current psychosocial stressors, overall decrease in distress due to moving plans; continued stressors related to safety; intimate relationships. Validated patient's feelings of stress and anxiety. Assisted patient in problem solving options related to relationship challenges, encouraging her to weigh pros and cons of increasing honesty within the  relationship and encouraged her  to write out what it is that she wants to share. Provided support through active listening, validation of feelings, and highlighted patient's strengths.   Diagnosis:   ICD-10-CM   1. Depression, postpartum  O99.345    F53.0     Plan: Not being so stressed with everything on her mind  Treatment Target: Increase realistic balanced thinking   Explore patient's thoughts, beliefs, automatic thoughts, assumptions   Identify and replace thinking that leads to depression  Process distress and allow for emotional release   Cognitive reframing   Questioning and challenging thoughts Treatment Target: Reducing vulnerability to "emotional mind"  Values clarification   Self-care -nutrition, sleep, exercise   Increase positive events   Teach and practice mindfulness  Gratitude statements  Problem solving safe housing options  Future Appointments  Date Time Provider Department Center  11/26/2019  1:00 PM Kathreen Cosier, LCSW [facility] None    Interpreter used: NA  Kathreen Cosier, LCSW

## 2019-11-26 ENCOUNTER — Ambulatory Visit: Payer: Medicaid Other | Admitting: Licensed Clinical Social Worker

## 2019-11-26 DIAGNOSIS — O99345 Other mental disorders complicating the puerperium: Secondary | ICD-10-CM

## 2019-11-26 DIAGNOSIS — F53 Postpartum depression: Secondary | ICD-10-CM
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
## 2019-11-27 ENCOUNTER — Ambulatory Visit: Payer: Medicaid Other | Admitting: Licensed Clinical Social Worker

## 2019-12-04 ENCOUNTER — Ambulatory Visit: Payer: Medicaid Other | Admitting: Licensed Clinical Social Worker

## 2019-12-04 DIAGNOSIS — O99345 Other mental disorders complicating the puerperium: Secondary | ICD-10-CM

## 2019-12-04 DIAGNOSIS — F53 Postpartum depression: Secondary | ICD-10-CM

## 2019-12-04 NOTE — Progress Notes (Signed)
Counselor/Therapist Progress Note  Patient ID: Susan Cantrell, MRN: [id number],    Date: 12/04/2019  Time Spent: 45 minutes    Treatment Type: Individual Therapy  Reported Symptoms: Obsessive thinking and anxiety, anxious thoughts, mild depressive symptoms  Mental Status Exam:   Appearance:   Casual     Behavior:  Appropriate and Sharing  Motor:  Normal  Speech/Language:   Normal Rate  Affect:  Appropriate, Congruent and Full Range  Mood:  dysthymic  Thought process:  normal  Thought content:    WNL  Sensory/Perceptual disturbances:    WNL  Orientation:  oriented to person, place, time/date and situation  Attention:  Good  Concentration:  Good  Memory:  WNL  Fund of knowledge:   Good  Insight:    Good  Judgment:   Good  Impulse Control:  Good   Risk Assessment: Danger to Self:  No Self-injurious Behavior: No Danger to Others: No Duty to Warn:no Physical Aggression / Violence:No  Access to Firearms a concern: No  Gang Involvement:No   Subjective: Patient was engaged and cooperative throughout the session using time effectively to discuss thoughts and feelings. Patient voices continued motivation for treatment and understanding of depression/anxiety issues related to multiple stressors. Patient reports that she is mostly settled into her new apartment but does feel more anxiety due to the new location. Patient is likely to benefit from future treatment because she remains motivated to decrease symptoms and improve functioning and reports benefit of regular sessions.   Interventions: Cognitive Behavioral Therapy Established psychological safety. Checked in with patient regarding her week. Engaged patient in processing current psychosocial stressors, continued anxiety and intermittent depressive symptoms. Discussed strategies to manage "What if's". Encouraged patient to engage in positive peer support. Provided support through active listening, validation of feelings, and  highlighted patient's strengths.   Diagnosis:   ICD-10-CM   1. Depression, postpartum  O99.345    F53.0     Plan: Not being so stressed with everything on her mind  Treatment Target: Increase realistic balanced thinking   Explore patient's thoughts, beliefs, automatic thoughts, assumptions   Identify and replace thinking that leads to depression  Process distress and allow for emotional release   Cognitive reframing   Questioning and challenging thoughts Treatment Target: Reducing vulnerability to "emotional mind"  Values clarification   Self-care -nutrition, sleep, exercise   Increase positive events   Teach and practice mindfulness  Gratitude statements  Future Appointments  Date Time Provider Department Center  12/17/2019  2:00 PM Kathreen Cosier, LCSW [facility] None    Interpreter used: NA   Kathreen Cosier, LCSW

## 2019-12-17 ENCOUNTER — Ambulatory Visit: Payer: Medicaid Other | Admitting: Licensed Clinical Social Worker

## 2019-12-17 DIAGNOSIS — F53 Postpartum depression: Secondary | ICD-10-CM

## 2019-12-17 DIAGNOSIS — O99345 Other mental disorders complicating the puerperium: Secondary | ICD-10-CM

## 2019-12-17 NOTE — Progress Notes (Signed)
Counselor/Therapist Progress Note  Patient ID: Susan Cantrell, MRN: [id number],    Date: 12/17/2019  Time Spent: 32 minutes   Treatment Type: Individual Therapy  Reported Symptoms: Appetite disturbance and depressed mood, anhedonia, sleep disturbance, anxiety, anxious thoughts   Mental Status Exam:  Appearance:   Casual and Well Groomed     Behavior:  Appropriate and Sharing  Motor:  Normal  Speech/Language:   Normal Rate  Affect:  Appropriate, Congruent, Full Range and Tearful  Mood:  anxious  Thought process:  normal  Thought content:    WNL  Sensory/Perceptual disturbances:    WNL  Orientation:  oriented to person, place, time/date and situation  Attention:  Good  Concentration:  Good  Memory:  WNL  Fund of knowledge:   Good  Insight:    Good  Judgment:   Good  Impulse Control:  Good   Risk Assessment: Danger to Self:  No Self-injurious Behavior: No Danger to Others: No Duty to Warn:no Physical Aggression / Violence:No  Access to Firearms a concern: No  Gang Involvement:No   Subjective: Patient was engaged and cooperative throughout the session using time effectively to discuss thoughts and feelings. The session ended abruptly because patient's phone died - patient had informed LCSW that this would happen. Patient voices continued motivation for treatment and understanding of depression/anxiety due to multiple stressors. Patient is likely to benefit from future treatment because she remains motivated to decrease depression/anxiety symptoms and reports benefit of regular sessions in addressing these symptoms. Patient is recommended for med management to further decrease symptoms.   Interventions: Cognitive Behavioral Therapy Established psychological safety. Checked in with patient and engaged her in processing current psychosocial stress, increased depressive symptoms due to multiple stressors, including relaitonship stressors. Validated patient's feelings of distress,  providing supportive counseling. Reviewed the importance of self-care, including sleep hygiene, nutrition, and engaging in supportive relationships. regarding her week. Provided support through active listening, validation of feelings, and highlighted patient's strengths.   Diagnosis:   ICD-10-CM   1. Depression, postpartum  O99.345    F53.0    Plan:  Not being so stressed with everything on her mind  Treatment Target: Increase realistic balanced thinking   Explore patient's thoughts, beliefs, automatic thoughts, assumptions   Identify and replace thinking that leads to depression  Process distress and allow for emotional release   Cognitive reframing   Questioning and challenging thoughts Treatment Target: Reducing vulnerability to "emotional mind"  Values clarification   Self-care -nutrition, sleep, exercise   Increase positive events   Teach and practice mindfulness  Gratitude statements  No future appointments. Patient's phone died, she reported she would email LCSW to schedule appt.   Interpreter used: NA  Kathreen Cosier, LCSW

## 2019-12-25 ENCOUNTER — Ambulatory Visit: Payer: Medicaid Other | Admitting: Licensed Clinical Social Worker

## 2019-12-25 DIAGNOSIS — F53 Postpartum depression: Secondary | ICD-10-CM

## 2019-12-25 DIAGNOSIS — O99345 Other mental disorders complicating the puerperium: Secondary | ICD-10-CM
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
## 2020-01-01 ENCOUNTER — Ambulatory Visit: Payer: Medicaid Other | Admitting: Licensed Clinical Social Worker

## 2020-01-01 DIAGNOSIS — F53 Postpartum depression: Secondary | ICD-10-CM

## 2020-01-01 NOTE — Progress Notes (Signed)
Counselor/Therapist Progress Note  Patient ID: Susan Cantrell, MRN: [id number],    Date: 01/01/2020  Time Spent: 48 minutes   Treatment Type: Individual Therapy  Reported Symptoms: Obsessive thinking and anxiety, anxious thoughts, stress, low mood  Mental Status Exam:  Appearance:   Casual, Neat and Well Groomed     Behavior:  Appropriate and Sharing  Motor:  Normal  Speech/Language:   Normal Rate  Affect:  Appropriate, Congruent and Tearful  Mood:  dysthymic and sad  Thought process:  normal  Thought content:    WNL  Sensory/Perceptual disturbances:    WNL  Orientation:  oriented to person, place, time/date, situation and day of week  Attention:  Good  Concentration:  Good  Memory:  WNL  Fund of knowledge:   Good  Insight:    Good  Judgment:   Fair  Impulse Control:  Good   Risk Assessment: Danger to Self:  No Self-injurious Behavior: No Danger to Others: No Duty to Warn:no Physical Aggression / Violence:No  Access to Firearms a concern: No  Gang Involvement:No   Subjective: Patient was engaged and cooperative throughout the session using time effectively to discuss thoughts and feelings. Patient voices continued motivation for treatment and understanding of depression and anxiety issues. Patient is likely to benefit from future treatment because she remains motivated to decrease anxiety and depression and reports benefit of regular sessions in addressing these symptoms.   Interventions: Cognitive Behavioral Therapy Established psychological safety. Checked in with patient regarding her week. Provided supportive space encouraging emotional release and processing of current psychosocial stressors. Taught patient about worry time - setting a time limit around worrying and then refocusing. Reminded patient to recognize what is going right/gratidue list. Reviewed mindfulness strategies to manage increased symptoms in the evenings. Encouraged patient to continue to engage in  positive peer relationships. Provided support through active listening, validation of feelings, and highlighted patient's strengths.   Diagnosis:   ICD-10-CM   1. Depression, postpartum  O99.345    F53.0    Plan: Not being so stressed with everything on her mind  Treatment Target: Increase realistic balanced thinking   Explore patient's thoughts, beliefs, automatic thoughts, assumptions   Identify and replace thinking that leads to depression  Process distress and allow for emotional release   Cognitive reframing   Questioning and challenging thoughts Treatment Target: Reducing vulnerability to "emotional mind"  Values clarification   Self-care -nutrition, sleep, exercise   Increase positive events   Teach and practice mindfulness  Gratitude statements  Future Appointments  Date Time Provider Department Center  01/17/2020  1:00 PM Kathreen Cosier, LCSW [facility] None   Kathreen Cosier, LCSW

## 2020-01-17 ENCOUNTER — Ambulatory Visit: Payer: Medicaid Other | Admitting: Licensed Clinical Social Worker

## 2020-01-17 DIAGNOSIS — F53 Postpartum depression: Secondary | ICD-10-CM

## 2020-01-17 DIAGNOSIS — O99345 Other mental disorders complicating the puerperium: Secondary | ICD-10-CM

## 2020-01-17 NOTE — Progress Notes (Signed)
Counselor/Therapist Progress Note  Patient ID: Susan Cantrell, MRN: [id number],    Date: 01/17/2020  Time Spent: 45 minutes   Treatment Type: Psychotherapy  Reported Symptoms: Obsessive thinking, Anhedonia, Appetite disturbance and depressed mood, anxiety, anxious thoughts   Mental Status Exam:  Appearance:   Casual and Well Groomed     Behavior:  Appropriate and Sharing  Motor:  Normal  Speech/Language:   Normal Rate  Affect:  Appropriate, Congruent and Full Range  Mood:  depressed and sad  Thought process:  normal  Thought content:    WNL  Sensory/Perceptual disturbances:    WNL  Orientation:  oriented to person, place, time/date, situation and day of week  Attention:  Good  Concentration:  Good  Memory:  WNL  Fund of knowledge:   Good  Insight:    Good  Judgment:   Good  Impulse Control:  Good   Risk Assessment: Danger to Self:  No Self-injurious Behavior: No Danger to Others: No Duty to Warn:no Physical Aggression / Violence:No  Access to Firearms a concern: No  Gang Involvement:No   Subjective: Patient was engaged and cooperative throughout the session using time effectively to discuss thoughts and feelings. Patient voices continued motivation for treatment and understanding of depression and anxiety. Patient is likely to benefit from future treatment because she remains motivated to decrease depression and anxiety and reports benefit of regular sessions in addressing these symptoms. Patient reports that she has begun taking Zoloft again - 100mg  per day.   Interventions: Cognitive Behavioral Therapy Established psychological safety. Checked in with patient regarding current psychosocial stressors and symptoms. Provided supportive space encouraging emotional release and processing of current psychosocial stressors, continued depression/anxiety due to multiple stressors. Discussed patient's unhelpful thought pattern - catastrophizing and encouraged patient to notice this  and to challenge/reframe these thoughts. Provided support through active listening, validation of feelings, and highlighted patient's strengths.   Diagnosis:   ICD-10-CM   1. Depression, postpartum  O99.345    F53.0    Plan: Not being so stressed with everything on her mind  Treatment Target: Increase realistic balanced thinking   Explore patient's thoughts, beliefs, automatic thoughts, assumptions   Identify and replace thinking that leads to depression  Process distress and allow for emotional release   Cognitive reframing   Questioning and challenging thoughts Treatment Target: Reducing vulnerability to "emotional mind"  Values clarification   Self-care -nutrition, sleep, exercise   Increase positive events   Teach and practice mindfulness  Gratitude statements  Future Appointments  Date Time Provider Department Center  01/22/2020  2:00 PM 03/21/2020, LCSW [facility] None    Kathreen Cosier, LCSW

## 2020-01-22 ENCOUNTER — Ambulatory Visit: Payer: Medicaid Other | Admitting: Licensed Clinical Social Worker

## 2020-01-23 ENCOUNTER — Ambulatory Visit: Payer: Medicaid Other | Admitting: Licensed Clinical Social Worker

## 2020-01-23 DIAGNOSIS — F53 Postpartum depression: Secondary | ICD-10-CM
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
## 2020-02-05 ENCOUNTER — Ambulatory Visit: Payer: Medicaid Other | Admitting: Licensed Clinical Social Worker

## 2020-02-05 DIAGNOSIS — F53 Postpartum depression: Secondary | ICD-10-CM

## 2020-02-05 DIAGNOSIS — O99345 Other mental disorders complicating the puerperium: Secondary | ICD-10-CM

## 2020-02-05 NOTE — Progress Notes (Signed)
Counselor/Therapist Progress Note  Patient ID: Susan Cantrell, MRN: [id number],    Date: 02/05/2020  Time Spent: 45 minutes    Treatment Type: Psychotherapy  Reported Symptoms: Obsessive thinking, Anhedonia, Sleep disturbance, Appetite disturbance, Fatigue and depressed mood- with some improvement, anxiety, anxious thoughts   Mental Status Exam:  Appearance:   Casual     Behavior:  Appropriate and Sharing  Motor:  Normal  Speech/Language:   Normal Rate  Affect:  Appropriate, Congruent and Full Range  Mood:  normal  Thought process:  normal  Thought content:    WNL  Sensory/Perceptual disturbances:    WNL  Orientation:  oriented to person, place, time/date and situation  Attention:  Good  Concentration:  Good  Memory:  WNL  Fund of knowledge:   Good  Insight:    Fair  Judgment:   Fair  Impulse Control:  Fair   Risk Assessment: Danger to Self:  No Self-injurious Behavior: No Danger to Others: No Duty to Warn:no Physical Aggression / Violence:No  Access to Firearms a concern: No  Gang Involvement:No   Subjective: Patient was engaged and cooperative throughout the session using time effectively to discuss thoughts and feelings. Patient voices a decrease in distress and continued motivation for treatment. She reports understanding of depression and anxiety issues and the impact of multiple stressors. Patient is likely to benefit from future treatment because she remains motivated to decrease symptoms and improve functioning and reports benefit of regular sessions in addressing depression and anxiety symptoms.   Interventions: Cognitive Behavioral Therapy Established psychological safety. Checked in with patient and engaged her in processing current psychosocial stressors, continued depression and anxiety with a decrease in distress. Provided supportive space encouraging emotional release and processing of current psychosocial stressors, highlighting patient's thoughts, feelings  and behaviors. Encouraged patient to continue to engage in positive meaningful activities and to take meds as prescribed. Provided support through active listening, validation of feelings, and highlighted patient's strengths.   Diagnosis:   ICD-10-CM   1. Depression, postpartum  O99.345    F53.0    Plan: Not being so stressed with everything on her mind  Treatment Target: Increase realistic balanced thinking   Explore patient's thoughts, beliefs, automatic thoughts, assumptions   Identify and replace thinking that leads to depression  Process distress and allow for emotional release   Cognitive reframing   Questioning and challenging thoughts Treatment Target: Reducing vulnerability to "emotional mind"  Values clarification   Self-care -nutrition, sleep, exercise   Increase positive events   Teach and practice mindfulness  Gratitude statements  Future Appointments  Date Time Provider Department Center  02/14/2020  9:00 AM Kathreen Cosier, LCSW [facility] None    Kathreen Cosier, [place]

## 2020-02-14 ENCOUNTER — Ambulatory Visit: Payer: Medicaid Other | Admitting: Licensed Clinical Social Worker

## 2020-02-19 ENCOUNTER — Ambulatory Visit: Payer: Medicaid Other | Admitting: Licensed Clinical Social Worker

## 2020-02-19 DIAGNOSIS — F53 Postpartum depression: Secondary | ICD-10-CM

## 2020-02-19 DIAGNOSIS — O99345 Other mental disorders complicating the puerperium: Secondary | ICD-10-CM

## 2020-02-19 NOTE — Progress Notes (Signed)
Counselor/Therapist Progress Note  Patient ID: Susan Cantrell, MRN: [id number],    Date: 02/19/2020  Time Spent: 45 minutes    Treatment Type: Psychotherapy  Reported Symptoms: Obsessive thinking, Sleep disturbance and anxiety, anxious thought; overall increased mood stability and decrease in anxiety  Mental Status Exam:  Appearance:   Neat and Well Groomed     Behavior:  Appropriate and Sharing  Motor:  Normal  Speech/Language:   Normal Rate  Affect:  Appropriate, Congruent and Full Range  Mood:  normal  Thought process:  normal  Thought content:    WNL  Sensory/Perceptual disturbances:    WNL  Orientation:  oriented to person, place, time/date and situation  Attention:  Good  Concentration:  Good  Memory:  WNL  Fund of knowledge:   Good  Insight:    Fair  Judgment:   Good  Impulse Control:  Fair   Risk Assessment: Danger to Self:  No Self-injurious Behavior: No Danger to Others: No Duty to Warn:no Physical Aggression / Violence:No  Access to Firearms a concern: No  Gang Involvement:No   Subjective: Patient was engaged and cooperative throughout the session using time effectively to discuss thoughts and feelings. Patient voices continued motivation for treatment and understanding of depression issues. Patient is likely to benefit from future treatment because she remains motivated to decrease depression/anxiety and reports benefit of both med management and in regular sessions addressing these symptoms.   Interventions: Cognitive Behavioral Therapy Established psychological safety. Checked in with patient regarding current psychosocial stressors, overall increase mood stability and decrease in anxiety. Provided supportive space for patient to share concerns related to father of youngest child. Validated patient's feelings of worry and fear and encouraged her to follow through with speaking to an attorney regarding her concerns. Encouraged patient to continue to engage in  and plan for positive events as these help her mood. Praised patient for taking meds as prescribed. Provided support through active listening, validation of feelings, and highlighted patient's strengths.   Diagnosis:   ICD-10-CM   1. Depression, postpartum  O99.345    F53.0     Plan: Not being so stressed with everything on her mind  Treatment Target: Increase realistic balanced thinking   Explore patient's thoughts, beliefs, automatic thoughts, assumptions   Identify and replace thinking that leads to depression  Process distress and allow for emotional release   Cognitive reframing   Questioning and challenging thoughts Treatment Target: Reducing vulnerability to "emotional mind"  Values clarification   Self-care -nutrition, sleep, exercise   Increase positive events   Teach and practice mindfulness  Gratitude statements  Future Appointments  Date Time Provider Department Center  03/05/2020  1:00 PM Kathreen Cosier, LCSW [facility] None   Kathreen Cosier, LCSW

## 2020-02-28 ENCOUNTER — Other Ambulatory Visit: Payer: Self-pay

## 2020-02-28 ENCOUNTER — Other Ambulatory Visit: Payer: Self-pay | Admitting: Obstetrics and Gynecology

## 2020-02-28 ENCOUNTER — Ambulatory Visit
Admission: RE | Admit: 2020-02-28 | Discharge: 2020-02-28 | Disposition: A | Discharge status: Home / Self Care | Payer: Medicaid Other | Source: Ambulatory Visit | Attending: Obstetrics and Gynecology | Admitting: Obstetrics and Gynecology

## 2020-02-28 DIAGNOSIS — O200 Threatened abortion: Secondary | ICD-10-CM | POA: Insufficient documentation

## 2020-03-04 ENCOUNTER — Other Ambulatory Visit: Payer: Self-pay

## 2020-03-04 ENCOUNTER — Other Ambulatory Visit
Admission: RE | Admit: 2020-03-04 | Discharge: 2020-03-04 | Disposition: A | Discharge status: Home / Self Care | Payer: Medicaid Other | Source: Ambulatory Visit | Attending: Obstetrics and Gynecology | Admitting: Obstetrics and Gynecology

## 2020-03-04 DIAGNOSIS — Z20822 Contact with and (suspected) exposure to covid-19: Secondary | ICD-10-CM | POA: Diagnosis present

## 2020-03-04 LAB — SARS CORONAVIRUS 2 BY RT PCR (HOSPITAL ORDER, PERFORMED IN ~~LOC~~ HOSPITAL LAB): SARS Coronavirus 2: NEGATIVE
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
## 2020-03-05 ENCOUNTER — Other Ambulatory Visit: Payer: Self-pay

## 2020-03-05 ENCOUNTER — Ambulatory Visit: Payer: Medicaid Other | Admitting: Anesthesiology

## 2020-03-05 ENCOUNTER — Encounter: Payer: Self-pay | Admitting: Obstetrics and Gynecology

## 2020-03-05 ENCOUNTER — Ambulatory Visit
Admission: RE | Admit: 2020-03-05 | Discharge: 2020-03-05 | Disposition: A | Discharge status: Home / Self Care | Payer: Medicaid Other | Attending: Obstetrics and Gynecology | Admitting: Obstetrics and Gynecology

## 2020-03-05 ENCOUNTER — Encounter
Admission: RE | Disposition: A | Discharge status: Home / Self Care | Payer: Self-pay | Source: Home / Self Care | Attending: Obstetrics and Gynecology

## 2020-03-05 ENCOUNTER — Ambulatory Visit: Payer: Medicaid Other | Admitting: Licensed Clinical Social Worker

## 2020-03-05 DIAGNOSIS — Z79899 Other long term (current) drug therapy: Secondary | ICD-10-CM | POA: Diagnosis not present

## 2020-03-05 DIAGNOSIS — Z880 Allergy status to penicillin: Secondary | ICD-10-CM | POA: Insufficient documentation

## 2020-03-05 DIAGNOSIS — O034 Incomplete spontaneous abortion without complication: Secondary | ICD-10-CM | POA: Insufficient documentation

## 2020-03-05 DIAGNOSIS — Z885 Allergy status to narcotic agent status: Secondary | ICD-10-CM | POA: Insufficient documentation

## 2020-03-05 DIAGNOSIS — Z881 Allergy status to other antibiotic agents status: Secondary | ICD-10-CM | POA: Insufficient documentation

## 2020-03-05 HISTORY — PX: DILATION AND EVACUATION: SHX1459

## 2020-03-05 LAB — BASIC METABOLIC PANEL
Anion gap: 7 (ref 5–15)
BUN: 8 mg/dL (ref 6–20)
CO2: 22 mmol/L (ref 22–32)
Calcium: 8.6 mg/dL — ABNORMAL LOW (ref 8.9–10.3)
Chloride: 109 mmol/L (ref 98–111)
Creatinine, Ser: 0.66 mg/dL (ref 0.44–1.00)
GFR, Estimated: >60 mL/min (ref >60)
Glucose, Bld: 94 mg/dL (ref 70–99)
Potassium: 3.8 mmol/L (ref 3.5–5.1)
Sodium: 138 mmol/L (ref 135–145)

## 2020-03-05 LAB — CBC
HCT: 24.2 % — ABNORMAL LOW (ref 36.0–46.0)
Hemoglobin: 7.9 g/dL — ABNORMAL LOW (ref 12.0–15.0)
MCH: 28.8 pg (ref 26.0–34.0)
MCHC: 32.6 g/dL (ref 30.0–36.0)
MCV: 88.3 fL (ref 80.0–100.0)
Platelets: 333 10*3/uL (ref 150–400)
RBC: 2.74 MIL/uL — ABNORMAL LOW (ref 3.87–5.11)
RDW: 13.3 % (ref 11.5–15.5)
WBC: 5.9 10*3/uL (ref 4.0–10.5)
nRBC: 0 % (ref 0.0–0.2)

## 2020-03-05 LAB — TYPE AND SCREEN
ABO/RH(D): A POS
Antibody Screen: NEGATIVE

## 2020-03-05 SURGERY — DILATION AND EVACUATION, UTERUS
Anesthesia: General

## 2020-03-05 MED ORDER — ACETAMINOPHEN 500 MG PO TABS
ORAL_TABLET | ORAL | Status: AC
  Administered 2020-03-05: 1000 mg via ORAL
  Filled 2020-03-05: qty 2

## 2020-03-05 MED ORDER — ONDANSETRON HCL 4 MG/2ML IJ SOLN
INTRAMUSCULAR | Status: AC
  Filled 2020-03-05: qty 2

## 2020-03-05 MED ORDER — PROPOFOL 10 MG/ML IV BOLUS
INTRAVENOUS | Status: AC
  Filled 2020-03-05: qty 20

## 2020-03-05 MED ORDER — DEXAMETHASONE SODIUM PHOSPHATE 10 MG/ML IJ SOLN
INTRAMUSCULAR | Status: DC | PRN
  Administered 2020-03-05: 10 mg via INTRAVENOUS

## 2020-03-05 MED ORDER — SILVER NITRATE-POT NITRATE 75-25 % EX MISC
CUTANEOUS | Status: AC
  Filled 2020-03-05: qty 10

## 2020-03-05 MED ORDER — ORAL CARE MOUTH RINSE: 15.0000 mL | Freq: Once | OROMUCOSAL | Status: AC

## 2020-03-05 MED ORDER — ONDANSETRON HCL 4 MG/2ML IJ SOLN: 4.0000 mg | Freq: Once | INTRAMUSCULAR | Status: DC | PRN

## 2020-03-05 MED ORDER — FENTANYL CITRATE (PF) 100 MCG/2ML IJ SOLN
INTRAMUSCULAR | Status: AC
  Filled 2020-03-05: qty 2

## 2020-03-05 MED ORDER — LACTATED RINGERS IV SOLN: INTRAVENOUS | Status: DC

## 2020-03-05 MED ORDER — GABAPENTIN 300 MG PO CAPS
300.0000 mg | ORAL_CAPSULE | ORAL | Status: AC
Start: 2020-03-06 — End: 2020-03-05

## 2020-03-05 MED ORDER — GENTAMICIN SULFATE 40 MG/ML IJ SOLN
5.0000 mg/kg | INTRAVENOUS | Status: AC
  Administered 2020-03-05: 487.6 mg via INTRAVENOUS
  Filled 2020-03-05: qty 12.25

## 2020-03-05 MED ORDER — KETOROLAC TROMETHAMINE 30 MG/ML IJ SOLN
INTRAMUSCULAR | Status: DC | PRN
  Administered 2020-03-05: 30 mg via INTRAVENOUS

## 2020-03-05 MED ORDER — SILVER NITRATE-POT NITRATE 75-25 % EX MISC
CUTANEOUS | Status: DC | PRN
  Administered 2020-03-05: 2

## 2020-03-05 MED ORDER — DEXMEDETOMIDINE (PRECEDEX) IN NS 20 MCG/5ML (4 MCG/ML) IV SYRINGE
PREFILLED_SYRINGE | INTRAVENOUS | Status: AC
  Filled 2020-03-05: qty 5

## 2020-03-05 MED ORDER — CLINDAMYCIN PHOSPHATE 900 MG/50ML IV SOLN
INTRAVENOUS | Status: AC
  Filled 2020-03-05: qty 50

## 2020-03-05 MED ORDER — FENTANYL CITRATE (PF) 100 MCG/2ML IJ SOLN: 25.0000 ug | INTRAMUSCULAR | Status: DC | PRN

## 2020-03-05 MED ORDER — ONDANSETRON HCL 4 MG/2ML IJ SOLN
INTRAMUSCULAR | Status: DC | PRN
  Administered 2020-03-05: 4 mg via INTRAVENOUS

## 2020-03-05 MED ORDER — PROPOFOL 10 MG/ML IV BOLUS
INTRAVENOUS | Status: DC | PRN
  Administered 2020-03-05: 200 mg via INTRAVENOUS

## 2020-03-05 MED ORDER — GABAPENTIN 300 MG PO CAPS
ORAL_CAPSULE | ORAL | Status: AC
  Administered 2020-03-05: 300 mg via ORAL
  Filled 2020-03-05: qty 1

## 2020-03-05 MED ORDER — DEXMEDETOMIDINE HCL 200 MCG/2ML IV SOLN
INTRAVENOUS | Status: DC | PRN
  Administered 2020-03-05: 8 ug via INTRAVENOUS

## 2020-03-05 MED ORDER — CLINDAMYCIN PHOSPHATE 900 MG/50ML IV SOLN
900.0000 mg | INTRAVENOUS | Status: AC
  Administered 2020-03-05: 900 mg via INTRAVENOUS

## 2020-03-05 MED ORDER — MIDAZOLAM HCL 2 MG/2ML IJ SOLN
INTRAMUSCULAR | Status: DC | PRN
  Administered 2020-03-05: 2 mg via INTRAVENOUS

## 2020-03-05 MED ORDER — ACETAMINOPHEN 500 MG PO TABS: 1000.0000 mg | ORAL_TABLET | ORAL | Status: AC

## 2020-03-05 MED ORDER — MIDAZOLAM HCL 2 MG/2ML IJ SOLN
INTRAMUSCULAR | Status: AC
  Filled 2020-03-05: qty 2

## 2020-03-05 MED ORDER — MEPERIDINE HCL 50 MG/ML IJ SOLN: 6.2500 mg | INTRAMUSCULAR | Status: DC | PRN

## 2020-03-05 MED ORDER — CHLORHEXIDINE GLUCONATE 0.12 % MT SOLN: 15.0000 mL | Freq: Once | OROMUCOSAL | Status: AC

## 2020-03-05 MED ORDER — LIDOCAINE HCL (CARDIAC) PF 100 MG/5ML IV SOSY
PREFILLED_SYRINGE | INTRAVENOUS | Status: DC | PRN
  Administered 2020-03-05: 100 mg via INTRAVENOUS

## 2020-03-05 MED ORDER — CHLORHEXIDINE GLUCONATE 0.12 % MT SOLN
OROMUCOSAL | Status: AC
  Administered 2020-03-05: 15 mL via OROMUCOSAL
  Filled 2020-03-05: qty 15

## 2020-03-05 MED ORDER — FENTANYL CITRATE (PF) 100 MCG/2ML IJ SOLN
INTRAMUSCULAR | Status: DC | PRN
  Administered 2020-03-05 (×2): 50 ug via INTRAVENOUS

## 2020-03-05 MED ORDER — LIDOCAINE HCL (PF) 2 % IJ SOLN
INTRAMUSCULAR | Status: AC
  Filled 2020-03-05: qty 5

## 2020-03-05 MED ORDER — POVIDONE-IODINE 10 % EX SWAB: 2.0000 "application " | Freq: Once | CUTANEOUS | Status: DC

## 2020-03-05 MED ORDER — SUCCINYLCHOLINE CHLORIDE 20 MG/ML IJ SOLN
INTRAMUSCULAR | Status: DC | PRN
  Administered 2020-03-05: 120 mg via INTRAVENOUS

## 2020-03-05 SURGICAL SUPPLY — 22 items
CATH ROBINSON RED A/P 16FR (CATHETERS) ×2 IMPLANT
COVER WAND RF STERILE (DRAPES) ×2 IMPLANT
FILTER UTR ASPR SPEC (MISCELLANEOUS) ×1 IMPLANT
FLTR UTR ASPR SPEC (MISCELLANEOUS) ×2
GLOVE SURG SYN 8.0 (GLOVE) ×2 IMPLANT
GOWN STRL REUS W/ TWL LRG LVL3 (GOWN DISPOSABLE) ×1 IMPLANT
GOWN STRL REUS W/ TWL XL LVL3 (GOWN DISPOSABLE) ×1 IMPLANT
GOWN STRL REUS W/TWL LRG LVL3 (GOWN DISPOSABLE) ×2
GOWN STRL REUS W/TWL XL LVL3 (GOWN DISPOSABLE) ×2
KIT TURNOVER CYSTO (KITS) ×2 IMPLANT
MANIFOLD NEPTUNE II (INSTRUMENTS) IMPLANT
PACK DNC HYST (MISCELLANEOUS) ×2 IMPLANT
PAD OB MATERNITY 4.3X12.25 (PERSONAL CARE ITEMS) ×2 IMPLANT
PAD PREP 24X41 OB/GYN DISP (PERSONAL CARE ITEMS) ×2 IMPLANT
SET BERKELEY SUCTION TUBING (SUCTIONS) ×2 IMPLANT
TOWEL OR 17X26 4PK STRL BLUE (TOWEL DISPOSABLE) ×2 IMPLANT
VACURETTE 10 RIGID CVD (CANNULA) IMPLANT
VACURETTE 6 ASPIR F TIP BERK (CANNULA) IMPLANT
VACURETTE 7MM F TIP (CANNULA) ×1
VACURETTE 7MM F TIP STRL (CANNULA) ×1 IMPLANT
VACURETTE 8 RIGID CVD (CANNULA) IMPLANT
VACURETTE 8MM F TIP (MISCELLANEOUS) IMPLANT

## 2020-03-05 NOTE — Progress Notes (Signed)
Counselor/Therapist Progress Note  Patient ID: Susan Cantrell, MRN: [id number],    Date: 03/06/2020  Time Spent: 41 minutes  Treatment Type: Psychotherapy  Reported Symptoms: Obsessive thinking, Physical aches and pain and depressed mood, anxiety, anxious thoughts   Mental Status Exam:  Appearance:   Casual     Behavior:  Appropriate and Sharing  Motor:  Normal  Speech/Language:   Normal Rate  Affect:  Appropriate, Congruent, Depressed and Tearful  Mood:  depressed and sad  Thought process:  normal  Thought content:    WNL  Sensory/Perceptual disturbances:    WNL  Orientation:  oriented to person, place, time/date, situation and day of week  Attention:  Good  Concentration:  Good  Memory:  WNL  Fund of knowledge:   Good  Insight:    Fair  Judgment:   Fair  Impulse Control:  Fair   Risk Assessment: Danger to Self:  No Self-injurious Behavior: No Danger to Others: No Duty to Warn:no Physical Aggression / Violence:No  Access to Firearms a concern: No  Gang Involvement:No   Subjective: Patient was engaged and cooperative throughout the session using time effectively to discuss thoughts and feelings.  Patient voices continued motivation for treatment and understanding of depression issues. Patient is likely to benefit from future treatment because she remains motivated to decrease distress and improve functioning and reports benefit of regular sessions in addressing these symptoms.   Interventions: Cognitive Behavioral Therapy Established psychological safety. Checked in with patient regarding her week. Engaged patient in processing current psychosocial stressors, increased distress due to miscarriage. Provided supportive space for patient to verbally ventilate, validating patient's feelings of sadness. Discussed engaging with positive peers and family members to reduce vulnerability. Provided support through active listening, validation of feelings, and highlighted patient's  strengths.   Diagnosis:   ICD-10-CM   1. Depression, postpartum  O99.345    F53.0    Plan: Not being so stressed with everything on her mind  Treatment Target: Increase realistic balanced thinking   Explore patient's thoughts, beliefs, automatic thoughts, assumptions   Identify and replace thinking that leads to depression  Process distress and allow for emotional release   Cognitive reframing   Questioning and challenging thoughts Treatment Target: Reducing vulnerability to "emotional mind"  Values clarification   Self-care -nutrition, sleep, exercise   Increase positive events   Teach and practice mindfulness  Gratitude statements  Future Appointments  Date Time Provider Department Center  03/13/2020 10:00 AM Kathreen Cosier, LCSW [facility] None    Kathreen Cosier, LCSW

## 2020-03-05 NOTE — Anesthesia Procedure Notes (Signed)
Procedure Name: Intubation Date/Time: 03/05/2020 2:17 PM Performed by: Hezzie Bump, CRNA Pre-anesthesia Checklist: Patient identified, Patient being monitored, Timeout performed, Emergency Drugs available and Suction available Patient Re-evaluated:Patient Re-evaluated prior to induction Oxygen Delivery Method: Circle system utilized Preoxygenation: Pre-oxygenation with 100% oxygen Induction Type: IV induction Ventilation: Mask ventilation without difficulty Laryngoscope Size: 3 and McGraph Grade View: Grade I Tube type: Oral Tube size: 7.0 mm Number of attempts: 1 Airway Equipment and Method: Stylet and Video-laryngoscopy Placement Confirmation: ETT inserted through vocal cords under direct vision,  positive ETCO2 and breath sounds checked- equal and bilateral Secured at: 21 cm Tube secured with: Tape Dental Injury: Teeth and Oropharynx as per pre-operative assessment

## 2020-03-05 NOTE — Anesthesia Postprocedure Evaluation (Signed)
Anesthesia Post Note  Patient: Susan Cantrell  Procedure(s) Performed: DILATATION AND EVACUATION (N/A )  Patient location during evaluation: PACU Anesthesia Type: General Level of consciousness: awake and alert, awake and oriented Pain management: pain level controlled Vital Signs Assessment: post-procedure vital signs reviewed and stable Respiratory status: spontaneous breathing, nonlabored ventilation and respiratory function stable Cardiovascular status: blood pressure returned to baseline and stable Postop Assessment: no apparent nausea or vomiting Anesthetic complications: no   No complications documented.   Last Vitals:  Vitals:   03/05/20 1448 03/05/20 1502  BP: 110/66 107/90  Pulse: 77 82  Resp: (!) 0 (!) 24  Temp: (!) 36.3 C   SpO2: 100% 100%    Last Pain:  Vitals:   03/05/20 1502  TempSrc:   PainSc: 0-No pain                 Manfred Arch

## 2020-03-05 NOTE — Brief Op Note (Signed)
03/05/2020  2:34 PM  PATIENT:  Susan Cantrell  30 y.o. female  PRE-OPERATIVE DIAGNOSIS:  incomplete abortion  POST-OPERATIVE DIAGNOSIS:  incomplete abortion  PROCEDURE:  Procedure(s): DILATATION AND EVACUATION (N/A)  SURGEON:  Surgeon(s) and Role:    * Bryse Blanchette, Ihor Austin, MD - Primary  PHYSICIAN ASSISTANT:   ASSISTANTS: none   ANESTHESIA:   general  EBL:  20 mL IOF 800 cc uo 100   BLOOD ADMINISTERED:none  DRAINS: none   LOCAL MEDICATIONS USED:  NONE  SPECIMEN:  Source of Specimen:  Products of conception   DISPOSITION OF SPECIMEN:  PATHOLOGY  COUNTS:  YES  TOURNIQUET:  * No tourniquets in log *  DICTATION: .Other Dictation: Dictation Number verbal  PLAN OF CARE: Discharge to home after PACU  PATIENT DISPOSITION:  PACU - hemodynamically stable.   Delay start of Pharmacological VTE agent (>24hrs) due to surgical blood loss or risk of bleeding: not applicable

## 2020-03-05 NOTE — Op Note (Signed)
NAME: Susan Cantrell, Susan Cantrell MEDICAL RECORD NO: [id number] ACCOUNT NO: [id number] DATE OF BIRTH: 1990-11-01 FACILITY: [facility] LOCATION: [facility] PHYSICIAN: Suzy Bouchard, MD  Operative Report   PREOPERATIVE DIAGNOSIS:  Incomplete abortion.  POSTOPERATIVE DIAGNOSIS:  Incomplete abortion.  PROCEDURE:  Suction dilation and curettage procedure.  ANESTHESIA:  General endotracheal anesthesia.  SURGEON:  Suzy Bouchard, MD  INDICATIONS:  This is a 30 year old female who had spontaneous rupture of membranes and passage of tissue 6 days prior to the procedure.  The patient continued to have cramping and bleeding and an office ultrasound shows retained products.  DESCRIPTION OF PROCEDURE:  After adequate general endotracheal anesthesia, the patient was placed in dorsal supine position with the legs in the Long Neck stirrups.  Perineum and vagina were prepped and draped in normal sterile fashion.  Timeout was  performed.  The patient did receive gentamicin and clindamycin for surgical prophylaxis given her ALLERGIES TO DOXYCYCLINE AND PENICILLIN.  Straight catheterization of the bladder yielded 100 mL clear urine.  Weighted speculum was placed in the posterior  vaginal vault and the anterior cervix was grasped with a single tooth tenaculum.  Cervix was dilated with #20 Hanks dilator without difficulty.  A #7 flexible suction curette was placed into the endometrial cavity and products of conception with tissue  was removed.  A sharp curettage was performed with good uterine cry and a repeat suction curetting with no additional tissue.  Good hemostasis was noted.  Silver nitrate was applied on the tenaculum sites.  The patient tolerated the procedure well and  was taken to recovery room in good condition.  ESTIMATED BLOOD LOSS:  20 mL  INTRAOPERATIVE FLUIDS:  800 mL  URINE OUTPUT:  100 mL.  The patient was taken to recovery room in good condition.   PUS D: 03/05/2020 2:52:18 pm T:  03/05/2020 3:38:00 pm  JOB: [id number]/ [id number]

## 2020-03-05 NOTE — Transfer of Care (Signed)
Immediate Anesthesia Transfer of Care Note  Patient: Susan Cantrell  Procedure(s) Performed: DILATATION AND EVACUATION (N/A )  Patient Location: PACU  Anesthesia Type:General  Level of Consciousness: drowsy  Airway & Oxygen Therapy: Patient Spontanous Breathing and Patient connected to face mask oxygen  Post-op Assessment: Report given to RN and Post -op Vital signs reviewed and stable  Post vital signs: Reviewed and stable  Last Vitals:  Vitals Value Taken Time  BP 110/66 03/05/20 1446  Temp    Pulse 75 03/05/20 1448  Resp 0 03/05/20 1448  SpO2 100 % 03/05/20 1448  Vitals shown include unvalidated device data.  Last Pain:  Vitals:   03/05/20 1232  TempSrc: Oral  PainSc: 0-No pain         Complications: No complications documented.

## 2020-03-05 NOTE — Discharge Instructions (Signed)

## 2020-03-05 NOTE — Anesthesia Preprocedure Evaluation (Addendum)
Anesthesia Evaluation  Patient identified by MRN, date of birth, ID band Patient awake    Reviewed: Allergy & Precautions, NPO status , Patient's Chart, lab work & pertinent test results  History of Anesthesia Complications Negative for: history of anesthetic complications  Airway Mallampati: II  TM Distance: >3 FB Neck ROM: Full    Dental no notable dental hx.    Pulmonary neg sleep apnea, neg COPD, Patient abstained from smoking., former smoker,    breath sounds clear to auscultation- rhonchi (-) wheezing      Cardiovascular Exercise Tolerance: Good (-) hypertension(-) CAD and (-) Past MI  Rhythm:Regular Rate:Normal - Systolic murmurs and - Diastolic murmurs    Neuro/Psych PSYCHIATRIC DISORDERS Depression negative neurological ROS     GI/Hepatic negative GI ROS, Neg liver ROS,   Endo/Other  negative endocrine ROSneg diabetes  Renal/GU negative Renal ROS     Musculoskeletal negative musculoskeletal ROS (+)   Abdominal (+) + obese,   Peds  Hematology negative hematology ROS (+)   Anesthesia Other Findings Past Medical History: 2014: Closed impacted fracture of left hip with routine healing     Comment:  no sx required 2014: Fracture of coccyx, initial encounter for closed fracture [facility])     Comment:  no sx required No date: Pregnancy     Comment:  [facility] 09/29/2015   Reproductive/Obstetrics                            Anesthesia Physical Anesthesia Plan  ASA: II  Anesthesia Plan: General   Post-op Pain Management:    Induction: Intravenous  PONV Risk Score and Plan: 2 and Ondansetron, Dexamethasone and Midazolam  Airway Management Planned: Oral ETT  Additional Equipment:   Intra-op Plan:   Post-operative Plan: Extubation in OR  Informed Consent: I have reviewed the patients History and Physical, chart, labs and discussed the procedure including the risks, benefits and  alternatives for the proposed anesthesia with the patient or authorized representative who has indicated his/her understanding and acceptance.     Dental advisory given  Plan Discussed with: CRNA and Anesthesiologist  Anesthesia Plan Comments:         Anesthesia Quick Evaluation

## 2020-03-05 NOTE — Progress Notes (Signed)
Pt is ready to go for D+C  For Inc ab . No interval change . NPO . Labs reviewed  Proceed

## 2020-03-06 ENCOUNTER — Encounter: Payer: Self-pay | Admitting: Obstetrics and Gynecology

## 2020-03-06 ENCOUNTER — Ambulatory Visit: Payer: Medicaid Other | Admitting: Licensed Clinical Social Worker

## 2020-03-06 DIAGNOSIS — F53 Postpartum depression: Secondary | ICD-10-CM

## 2020-03-06 DIAGNOSIS — O99345 Other mental disorders complicating the puerperium: Secondary | ICD-10-CM

## 2020-03-07 LAB — SURGICAL PATHOLOGY

## 2020-03-12 ENCOUNTER — Ambulatory Visit: Payer: Medicaid Other | Admitting: Licensed Clinical Social Worker

## 2020-03-12 DIAGNOSIS — F53 Postpartum depression: Secondary | ICD-10-CM

## 2020-03-12 DIAGNOSIS — O99345 Other mental disorders complicating the puerperium: Secondary | ICD-10-CM

## 2020-03-12 NOTE — Progress Notes (Signed)
Counselor/Therapist Progress Note  Patient ID: Susan Cantrell, MRN: [id number],    Date: 03/12/2020  Time Spent: 41 minutes   Treatment Type: Psychotherapy  Reported Symptoms: Obsessive thinking, Anhedonia, Sleep disturbance, Appetite disturbance, Isolation and withdrawal and depressed mood, distress; passive thoughts of wanting to die, denies intent or plan and agrees to attend scheduled session tomorrow   Mental Status Exam:  Appearance:   NA     Behavior:  Appropriate, Sharing and resistance  Motor:  NA  Speech/Language:   Normal Rate  Affect:  NA  Mood:  depressed and sad  Thought process:  normal  Thought content:    WNL  Sensory/Perceptual disturbances:    WNL  Orientation:  oriented to person, place, time/date, situation and day of week  Attention:  Good  Concentration:  Fair  Memory:  WNL  Fund of knowledge:   Good  Insight:    Fair  Judgment:   Poor  Impulse Control:  Poor   Risk Assessment: Danger to Self:  passive thoughts of wanting to be dead, no intent or plans.  Self-injurious Behavior: No Danger to Others: No Duty to Warn:no Physical Aggression / Violence:No  Access to Firearms a concern: No  Gang Involvement:No   Subjective: Patient was engaged and cooperative throughout the session using time effectively to discuss thoughts and feelings. Patient voices significant depressive symptoms and agrees to continue treatment. Patient is likely to benefit from future treatment because she remains motivated to decrease symptoms and improve functioning and reports benefit of regular sessions in addressing these symptoms.   Interventions: Cognitive Behavioral Therapy Established psychological safety. Provided supportive space encouraging emotional release and processing of current psychosocial stressors, increased depressive symptoms due to miscarriage and relationship problems with father of baby. Provided supportive counseling assisting patient in identifying  possible options to engage in self-care and in positive supportive relationships,  rolled with resistance. Patient contracted for safety and agreed to be at scheduled appointment tomorrow. Discussed options for crisis services and emergency services. Patient verbalizes not wanting to go to the hospital because she needs to pay bills and has no PTO. Provided support through active listening, validation of feelings, and highlighted patient's strengths.   Diagnosis:   ICD-10-CM   1. Depression, postpartum  O99.345    F53.0    Plan: Not being so stressed with everything on her mind Treatment Target: Increase realistic balanced thinking   Explore patient's thoughts, beliefs, automatic thoughts, assumptions   Identify and replace thinking that leads to depression  Process distress and allow for emotional release   Cognitive reframing   Questioning and challenging thoughts Treatment Target: Reducing vulnerability to "emotional mind"  Values clarification   Self-care -nutrition, sleep, exercise   Increase positive events   Teach and practice mindfulness  Gratitude statements  Future Appointments  Date Time Provider Department Center  03/13/2020 10:00 AM Kathreen Cosier, LCSW [facility] None    Kathreen Cosier, LCSW

## 2020-03-13 ENCOUNTER — Ambulatory Visit: Payer: Medicaid Other | Admitting: Licensed Clinical Social Worker

## 2020-03-13 DIAGNOSIS — F53 Postpartum depression: Secondary | ICD-10-CM

## 2020-03-13 DIAGNOSIS — O99345 Other mental disorders complicating the puerperium: Secondary | ICD-10-CM

## 2020-03-13 NOTE — Progress Notes (Signed)
Counselor/Therapist Progress Note  Patient ID: Susan Cantrell, MRN: [id number],    Date: 03/13/2020  Time Spent: 45 minutes   Treatment Type: Psychotherapy  Reported Symptoms: Obsessive thinking, Anhedonia, Sleep disturbance, Appetite disturbance, Isolation and withdrawal, Passive suicidal and Lack of motivation  Mental Status Exam:  Appearance:   Disheveled and still in the bed     Behavior:  Appropriate, Sharing and Resistant  Motor:  Normal  Speech/Language:   Clear and Coherent and Normal Rate  Affect:  Congruent, Depressed and Tearful  Mood:  depressed and sad  Thought process:  normal  Thought content:    WNL  Sensory/Perceptual disturbances:    WNL  Orientation:  oriented to person, place, time/date and situation  Attention:  Fair  Concentration:  Good  Memory:  WNL  Fund of knowledge:   Good  Insight:    Fair  Judgment:   Fair  Impulse Control:  Fair   Risk Assessment: Danger to Self:  passive suicidal thoughts, denies intent or plan and agrees to appt. on monday Self-injurious Behavior: No Danger to Others: No Duty to Warn:no Physical Aggression / Violence:No  Access to Firearms a concern: No  Gang Involvement:No   Subjective: Patient was engaged and cooperative throughout the session using time to discuss thoughts and feelings. Patient voices continued motivation for treatment and understanding of depression issues. Patient is likely to benefit from future treatment because she desires to decrease symptoms and improve functioning.    Interventions: Cognitive Behavioral Therapy Established psychological safety. Provided supportive space encouraging emotional release and processing of current psychosocial stressors.  Highlighted patient's unhelpful thoughts, challenging these thoughts leading to increased distress. Assessed for safety, patient currently denies intent or plans. Contracted for safety, patient agrees to reach out for help if needed and agrees to be at  next scheduled appointment with LCSW. Discussed patient's plans to spend time with family over the weekend. Provided support through active listening, validation of feelings, and highlighted patient's strengths.   Diagnosis:   ICD-10-CM   1. Depression, postpartum  O99.345    F53.0    Plan: Not being so stressed with everything on her mind Treatment Target: Increase realistic balanced thinking   Explore patient's thoughts, beliefs, automatic thoughts, assumptions   Identify and replace thinking that leads to depression  Process distress and allow for emotional release   Cognitive reframing   Questioning and challenging thoughts Treatment Target: Reducing vulnerability to "emotional mind"  Values clarification   Self-care -nutrition, sleep, exercise   Increase positive events   Teach and practice mindfulness  Gratitude statements  Future Appointments  Date Time Provider Department Center  03/17/2020  2:00 PM Kathreen Cosier, LCSW [facility] None   Kathreen Cosier, LCSW

## 2020-03-17 ENCOUNTER — Ambulatory Visit: Payer: Medicaid Other | Admitting: Licensed Clinical Social Worker

## 2020-03-17 NOTE — Progress Notes (Unsigned)
Counselor/Therapist Progress Note  Patient ID: AALIJAH MIMS, MRN: [id number],    Date: 03/17/2020  Time Spent: ***   Treatment Type: Psychotherapy  Reported Symptoms: {CHL AMB Reported [phone]}  Mental Status Exam:  Appearance:   {PSY:22683}     Behavior:  {PSY:21022743}  Motor:  {PSY:22302}  Speech/Language:   {PSY:22685}  Affect:  {PSY:22687}  Mood:  {PSY:31886}  Thought process:  {PSY:31888}  Thought content:    {PSY:[phone]}  Sensory/Perceptual disturbances:    {[phone]}  Orientation:  {PSY:30297}  Attention:  {PSY:22877}  Concentration:  {[phone]}  Memory:  {[phone]}  Fund of knowledge:   {[phone]}  Insight:    {[phone]}  Judgment:   {[phone]}  Impulse Control:  {[phone]}   Risk Assessment: Danger to Self:  {PSY:22692} Self-injurious Behavior: {PSY:22692} Danger to Others: {PSY:22692} Duty to Warn:{PSY:311194} Physical Aggression / Violence:{PSY:21197} Access to Firearms a concern: {PSY:21197} Gang Involvement:{PSY:21197}  Subjective: Patient was engaged and cooperative throughout the session using time effectively to discuss   Patient voices continued motivation for treatment and understanding of  . Patient is likely to benefit from future treatment because  remains motivated to decrease  And   and reports benefit of regular sessions in addressing these symptoms.   Interventions: Cognitive Behavioral Therapy Established psychological safety. Checked in with patient regarding her week. Reviewed previous session regarding  Therapist assisted, actively listened, taught, shared, role/played, provided   Diagnosis:   ICD-10-CM   1. Depression, postpartum  O99.345    F53.0     Plan: Not being so stressed with everything on her mind Treatment Target: Increase realistic balanced thinking   Explore patient's thoughts, beliefs, automatic thoughts, assumptions   Identify and replace thinking that  leads to depression  Process distress and allow for emotional release   Cognitive reframing   Questioning and challenging thoughts Treatment Target: Reducing vulnerability to "emotional mind"  Values clarification   Self-care -nutrition, sleep, exercise   Increase positive events   Teach and practice mindfulness  Gratitude statem  Future Appointments  Date Time Provider Department Center  03/17/2020  2:00 PM Kathreen Cosier, LCSW [facility] None    Kathreen Cosier, LCSW

## 2020-03-18 ENCOUNTER — Ambulatory Visit: Payer: Medicaid Other | Admitting: Licensed Clinical Social Worker

## 2020-03-18 DIAGNOSIS — F53 Postpartum depression: Secondary | ICD-10-CM

## 2020-03-18 NOTE — Progress Notes (Signed)
Counselor/Therapist Progress Note  Patient ID: Susan Cantrell, MRN: [id number],    Date: 03/18/2020  Time Spent: 34 minutes    Treatment Type: Psychotherapy  Reported Symptoms: Obsessive thinking, Sleep disturbance and depressed mood with some improvement, continued use of alcohol each evening denies SI  Mental Status Exam:  Appearance:   NA     Behavior:  Appropriate and Sharing  Motor:  NA  Speech/Language:   Clear and Coherent and Normal Rate  Affect:  NA  Mood:  normal  Thought process:  normal  Thought content:    WNL  Sensory/Perceptual disturbances:    WNL  Orientation:  oriented to person, place, time/date and situation  Attention:  Good  Concentration:  Good  Memory:  WNL  Fund of knowledge:   Good  Insight:    Fair  Judgment:   Good  Impulse Control:  Good   Risk Assessment: Danger to Self:  No Self-injurious Behavior: No Danger to Others: No Duty to Warn:no Physical Aggression / Violence:No  Access to Firearms a concern: No  Gang Involvement:No   Subjective: Patient was engaged and cooperative throughout the session using time effectively to discuss thoughts and feelings. Patient voices continued motivation for treatment and understanding of depression and anxiety issues. Patient is likely to benefit from future treatment because she remains motivated to decrease depression and anxiety and reports benefit of regular sessions in addressing these symptoms.    Interventions: Cognitive Behavioral Therapy Established psychological safety. Checked in with patient regarding her week and engaged her in processing current psychosocial stressors, decrease in over all distress. Provided supportive space allowing patient to verbally ventilate, normalizing patient's expereince and encouraging patient to continue to engage in positive supportive relationships. Highlighted patient's decrease in alcohol use and encouraged her to continue to decrease use. Provided support through  active listening, validation of feelings, and highlighted patient's strengths.   Diagnosis:   ICD-10-CM   1. Depression, postpartum  O99.345    F53.0    Plan: Not being so stressed with everything on her mind Treatment Target: Increase realistic balanced thinking   Explore patients thoughts, beliefs, automatic thoughts, assumptions   Identify and replace thinking that leads to depression  Process distress and allow for emotional release   Cognitive reframing   Questioning and challenging thoughts Treatment Target: Reducing vulnerability to emotional mind  Values clarification   Self-care -nutrition, sleep, exercise   Increase positive events   Teach and practice mindfulness  Gratitude statements  No future appointments.  Kathreen Cosier, LCSW

## 2020-03-20 ENCOUNTER — Other Ambulatory Visit: Payer: Medicaid Other

## 2020-03-27 ENCOUNTER — Ambulatory Visit: Payer: Medicaid Other | Admitting: Licensed Clinical Social Worker

## 2020-03-27 DIAGNOSIS — F53 Postpartum depression: Secondary | ICD-10-CM

## 2020-03-27 NOTE — Progress Notes (Signed)
Counselor/Therapist Progress Note  Patient ID: Susan Cantrell, MRN: [id number],    Date: 03/27/2020  Time Spent: 43 minutes   Treatment Type: Psychotherapy  Reported Symptoms: low mood, irritability, ruminating and anxious thoughts; deneis suicidal ideation; use of alcohol and MJ daily   Mental Status Exam:  Appearance:   Casual     Behavior:  Appropriate, Sharing and Resistant  Motor:  Normal  Speech/Language:   Clear and Coherent and Normal Rate  Affect:  Appropriate and Congruent  Mood:  normal  Thought process:  normal  Thought content:    WNL  Sensory/Perceptual disturbances:    WNL  Orientation:  oriented to person, place, time/date and situation  Attention:  Good  Concentration:  Fair  Memory:  WNL  Fund of knowledge:   Good  Insight:    Good  Judgment:   Poor  Impulse Control:  Poor   Risk Assessment: Danger to Self:  No Self-injurious Behavior: No Danger to Others: No Duty to Warn:no Physical Aggression / Violence:No  Access to Firearms a concern: No  Gang Involvement:No   Subjective: Patient was engaged and participated throughout the session using time to discuss thoughts and feelings. Patient voices daily use of alcohol and marijuana over the last few weeks as a way to cope, and she voices resistance to decrease use.  Patient voices continued motivation for treatment. Patient may benefit from future treatment because she remains motivated to decrease symptoms and improve functioning.   Interventions: Cognitive Behavioral Therapy and Mindfulness Meditation Established psychological safety. Checked in with patient regarding her week. Engaged patient in processing current psychosocial stressors, challenges in relationship with her mom; daily use of alcohol and marijuana. Using MI explored patient's readiness to decrease use of substances, rolling with resistance. LCSW has previously provided education around this and discussed the importance of not driving while  using and having another adult present for the children. Highlighted patient's unhelpful thoughts related to relationship with mom, discussing options to increase assertiveness communication. Provided support through active listening, validation of feelings, and highlighted patient's strengths.   Diagnosis:   ICD-10-CM   1. Depression, postpartum  O99.345    F53.0    Plan:  Not being so stressed with everything on her mind Treatment Target: Increase realistic balanced thinking   Explore patient's thoughts, beliefs, automatic thoughts, assumptions   Identify and replace thinking that leads to depression  Process distress and allow for emotional release   Cognitive reframing   Questioning and challenging thoughts Treatment Target: Reducing vulnerability to "emotional mind"  Values clarification   Self-care -nutrition, sleep, exercise   Increase positive events   Teach and practice mindfulness  Gratitude statements  Assertiveness communication   Future Appointments  Date Time Provider Department Center  04/03/2020  1:00 PM Kathreen Cosier, LCSW [facility] None   Kathreen Cosier, LCSW

## 2020-04-01 ENCOUNTER — Ambulatory Visit: Payer: Medicaid Other | Admitting: Licensed Clinical Social Worker

## 2020-04-01 DIAGNOSIS — F53 Postpartum depression: Secondary | ICD-10-CM

## 2020-04-01 DIAGNOSIS — O99345 Other mental disorders complicating the puerperium: Secondary | ICD-10-CM

## 2020-04-01 NOTE — Progress Notes (Signed)
Counselor/Therapist Progress Note  Patient ID: Susan Cantrell, MRN: [id number],    Date: 04/01/2020  Time Spent: 33 minutes    Treatment Type: Psychotherapy  Reported Symptoms: overall stable mood, irritability, anxiety  Mental Status Exam:  Appearance:   NA     Behavior:  Appropriate, Sharing and Resistant  Motor:  NA  Speech/Language:   Clear and Coherent and Normal Rate  Affect:  NA  Mood:  irritable  Thought process:  normal  Thought content:    WNL  Sensory/Perceptual disturbances:    WNL  Orientation:  oriented to person, place, time/date and situation  Attention:  Fair  Concentration:  Good  Memory:  WNL  Fund of knowledge:   Good  Insight:    Fair  Judgment:   Fair  Impulse Control:  Fair   Risk Assessment: Danger to Self:  No Self-injurious Behavior: No Danger to Others: No Duty to Warn:no Physical Aggression / Violence:No  Access to Firearms a concern: No  Gang Involvement:No   Subjective: Patient was engaged and cooperative throughout the session using time effectively to discuss thoughts and feelings. Patient voices continued motivation for treatment and understanding of depression/anxiety issues. Patient is likely to benefit from future treatment because she remains motivated to manage symptoms and improve functioning and reports benefit of regular sessions in addressing these symptoms.    Interventions: Cognitive Behavioral Therapy Established psychological safety. Checked in with patient and engaged her in processing current psychosocial stressors, increased irritability due to relationship challenges with mom. Explored patient's perception of relationship challenges clarifying patient's values within this relationship, highlighting ineffective communication, and assisting patient in assertive communication. Provided support through active listening, validation of feelings, and highlighted patient's strengths.   Diagnosis:   ICD-10-CM   1. Depression,  postpartum  O99.345    F53.0    Plan: Not being so stressed with everything on her mind Treatment Target: Increase realistic balanced thinking   Explore patient's thoughts, beliefs, automatic thoughts, assumptions   Identify and replace thinking that leads to depression  Process distress and allow for emotional release   Cognitive reframing   Questioning and challenging thoughts Treatment Target: Reducing vulnerability to "emotional mind"  Values clarification   Self-care -nutrition, sleep, exercise   Increase positive events   Teach and practice mindfulness  Gratitude statements  Assertiveness communication   Future Appointments  Date Time Provider Department Center  04/03/2020  1:00 PM Kathreen Cosier, LCSW [facility] None   Kathreen Cosier, LCSW

## 2020-04-03 ENCOUNTER — Ambulatory Visit: Payer: Medicaid Other | Admitting: Licensed Clinical Social Worker

## 2020-04-03 DIAGNOSIS — O99345 Other mental disorders complicating the puerperium: Secondary | ICD-10-CM

## 2020-04-03 DIAGNOSIS — F53 Postpartum depression: Secondary | ICD-10-CM

## 2020-04-03 NOTE — Progress Notes (Signed)
Counselor/Therapist Progress Note  Patient ID: Susan Cantrell, MRN: [id number],    Date: 04/03/2020  Time Spent: 40 minutes   Treatment Type: Psychotherapy  Reported Symptoms: mood liability, irritability, anxiety   Mental Status Exam:  Appearance:   NA     Behavior:  Appropriate and Sharing  Motor:  NA  Speech/Language:   Normal Rate  Affect:  NA  Mood:  dysthymic and irritable  Thought process:  goal directed  Thought content:    WNL  Sensory/Perceptual disturbances:    WNL  Orientation:  oriented to person, place, time/date and situation  Attention:  Good  Concentration:  Good  Memory:  WNL  Fund of knowledge:   Good  Insight:    Fair  Judgment:   Fair  Impulse Control:  Fair   Risk Assessment: Danger to Self:  No Self-injurious Behavior: No Danger to Others: No Duty to Warn:no Physical Aggression / Violence:No  Access to Firearms a concern: No  Gang Involvement:No   Subjective: Patient was engaged and cooperative throughout the session using time effectively to discuss thoughts and feelings. Patient voices continued motivation for treatment and understanding of depression and anxiety issues. Patient is likely to benefit from future treatment because she remains motivated to decrease symptoms and improve functioning.    Interventions: Cognitive Behavioral Therapy Established psychological safety. Checked in with patient and engaged her in processing current psychosocial stressors, conflict in relationship with her mom. Reviewed previous session regarding communication strategies. Discussed patient's values related to relationship with her mom. Encouraged patient to continue to live within her values. Provided support through active listening, validation of feelings, and highlighted patient's strengths.   Diagnosis:   ICD-10-CM   1. Depression, postpartum  O99.345    F53.0    Plan: Not being so stressed with everything on her mind Treatment Target: Increase  realistic balanced thinking   Explore patient's thoughts, beliefs, automatic thoughts, assumptions   Identify and replace thinking that leads to depression  Process distress and allow for emotional release   Cognitive reframing   Questioning and challenging thoughts Treatment Target: Reducing vulnerability to "emotional mind"  Values clarification   Self-care -nutrition, sleep, exercise   Increase positive events   Teach and practice mindfulness  Gratitude statements  Assertiveness communication  Future Appointments  Date Time Provider Department Center  04/09/2020  1:00 PM Kathreen Cosier, LCSW [facility] None    Kathreen Cosier, [place]

## 2020-04-09 ENCOUNTER — Ambulatory Visit: Payer: Medicaid Other | Admitting: Licensed Clinical Social Worker

## 2020-04-09 DIAGNOSIS — F53 Postpartum depression: Secondary | ICD-10-CM

## 2020-04-09 DIAGNOSIS — O99345 Other mental disorders complicating the puerperium: Secondary | ICD-10-CM

## 2020-04-09 NOTE — Progress Notes (Signed)
Counselor/Therapist Progress Note  Patient ID: Susan Cantrell, MRN: [id number],    Date: 04/09/2020  Time Spent: 45 minutes    Treatment Type: Psychotherapy  Reported Symptoms: overall stability   Mental Status Exam:  Appearance:   Casual, Neat and Well Groomed     Behavior:  Appropriate and Sharing  Motor:  Normal  Speech/Language:   Clear and Coherent and Normal Rate  Affect:  Appropriate, Congruent and Full Range  Mood:  euthymic  Thought process:  normal  Thought content:    WNL  Sensory/Perceptual disturbances:    WNL  Orientation:  oriented to person, place, time/date and situation  Attention:  Good  Concentration:  Good  Memory:  WNL  Fund of knowledge:   Good  Insight:    Fair  Judgment:   Fair  Impulse Control:  Fair   Risk Assessment: Danger to Self:  No Self-injurious Behavior: No Danger to Others: No Duty to Warn:no Physical Aggression / Violence:No  Access to Firearms a concern: No  Gang Involvement:No    Subjective: Patient was engaged and cooperative throughout the session using time effectively to discuss thoughts and feelings. Patient voices mood improvement due to improvement in romantic relationship. Patient is likely to benefit from future treatment because she remains motivated to manage depressive symptoms and anxiety and reports benefit of regular sessions in addressing these symptoms.    Interventions: Cognitive Behavioral Therapy Established psychological safety. Checked in with patient regarding her week. Reviewed previous session regarding relationship values and boundaries with mom. Praised patient for using assertiveness communication strategies. Encouraged patient to continue to engage in positive activities and engage with her children. Provided support through active listening, validation of feelings, and highlighted patient's strengths.    Diagnosis:   ICD-10-CM   1. Depression, postpartum  O99.345    F53.0    Plan: Not being so  stressed with everything on her mind Treatment Target: Increase realistic balanced thinking   Explore patient's thoughts, beliefs, automatic thoughts, assumptions   Identify and replace thinking that leads to depression  Process distress and allow for emotional release   Cognitive reframing   Questioning and challenging thoughts Treatment Target: Reducing vulnerability to "emotional mind"  Values clarification   Self-care -nutrition, sleep, exercise   Increase positive events   Teach and practice mindfulness  Gratitude statements  Assertiveness communication  Future Appointments  Date Time Provider Department Center  04/24/2020  1:20 PM Kathreen Cosier, LCSW [facility] None    Kathreen Cosier, LCSW

## 2020-04-24 ENCOUNTER — Ambulatory Visit: Payer: Medicaid Other | Admitting: Licensed Clinical Social Worker

## 2020-04-28 ENCOUNTER — Ambulatory Visit: Payer: Medicaid Other | Admitting: Licensed Clinical Social Worker

## 2020-04-28 DIAGNOSIS — O99345 Other mental disorders complicating the puerperium: Secondary | ICD-10-CM

## 2020-04-28 DIAGNOSIS — F53 Postpartum depression: Secondary | ICD-10-CM

## 2020-04-28 NOTE — Progress Notes (Signed)
Counselor/Therapist Progress Note  Patient ID: EYVA CALIFANO, MRN: [id number],    Date: 04/28/2020  Time Spent: 30 minutes   Treatment Type: Psychotherapy  Reported Symptoms: overall mood stability  Mental Status Exam:  Appearance:   NA     Behavior:  Appropriate and Sharing  Motor:  Normal  Speech/Language:   Clear and Coherent and Normal Rate  Affect:  NA  Mood:  euthymic  Thought process:  goal directed  Thought content:    WNL  Sensory/Perceptual disturbances:    WNL  Orientation:  oriented to person, place, time/date and situation  Attention:  Good  Concentration:  Good  Memory:  WNL  Fund of knowledge:   Good  Insight:    Fair  Judgment:   Good  Impulse Control:  Good   Risk Assessment: Danger to Self:  No Self-injurious Behavior: No Danger to Others: No Duty to Warn:no Physical Aggression / Violence:No  Access to Firearms a concern: No  Gang Involvement:No   Subjective: Patient was engaged and cooperative throughout the session using time effectively to discuss thoughts and feelings. Patient voices continued motivation for treatment and understanding of depression and anxiety issues. Patient is likely to benefit from future treatment because she remains motivated to manage symptoms and improve functioning and reports benefit of regular sessions in addressing these symptoms.   Interventions: Cognitive Behavioral Therapy Checked in with patient regarding her week. Engaged patient in processing current psychosocial stressors, overall mood improvement and mood stability. Explored patient's perception of improvement in mood, highlighting thoughts, feelings, and behaviors. Provided support through active listening, validation of feelings, and highlighted patient's strengths.   Diagnosis:   ICD-10-CM   1. Depression, postpartum  O99.345    F53.0    Plan: Not being so stressed with everything on her mind Treatment Target: Increase realistic balanced thinking    Explore patient's thoughts, beliefs, automatic thoughts, assumptions   Identify and replace thinking that leads to depression  Process distress and allow for emotional release   Cognitive reframing   Questioning and challenging thoughts Treatment Target: Reducing vulnerability to "emotional mind"  Values clarification   Self-care -nutrition, sleep, exercise   Increase positive events   Teach and practice mindfulness  Gratitude statements  Assertiveness communication  Patient to email to schedule next appointment due to new job and not knowing wrk schedule.   No future appointments.   Kathreen Cosier, LCSW

## 2020-05-13 ENCOUNTER — Ambulatory Visit: Payer: Medicaid Other | Admitting: Licensed Clinical Social Worker

## 2020-05-13 DIAGNOSIS — F53 Postpartum depression: Secondary | ICD-10-CM

## 2020-05-13 DIAGNOSIS — O99345 Other mental disorders complicating the puerperium: Secondary | ICD-10-CM

## 2020-05-13 NOTE — Progress Notes (Signed)
Counselor/Therapist Progress Note  Patient ID: Susan Cantrell, MRN: [id number],    Date: 05/13/2020  Time Spent: 45 minutes    Treatment Type: Psychotherapy  Reported Symptoms: stable mood; anxiety, anxious thoughts  Mental Status Exam:  Appearance:   NA     Behavior:  Appropriate and Sharing  Motor:  NA  Speech/Language:   Clear and Coherent and Normal Rate  Affect:  NA  Mood:  anxious and euthymic  Thought process:  normal  Thought content:    WNL  Sensory/Perceptual disturbances:    WNL  Orientation:  oriented to person, place, time/date and situation  Attention:  Fair  Concentration:  Good  Memory:  WNL  Fund of knowledge:   Good  Insight:    Good  Judgment:   Good  Impulse Control:  Good   Risk Assessment: Danger to Self:  No Self-injurious Behavior: No Danger to Others: No Duty to Warn:no Physical Aggression / Violence:No  Access to Firearms a concern: No  Gang Involvement:No   Subjective: Patient was engaged and cooperative throughout the session using time effectively to discuss thoughts and feelings. Patient voices continued motivation for treatment and understanding of anxiety and depression due to relationship challenges. Patient is likely to benefit from future treatment because she remains motivated to decrease depression/anxiety and reports benefit of regular sessions in addressing these symptoms.   Interventions: Cognitive Behavioral Therapy Established psychological safety. Checked in with patient regarding her week. Engaged patient in processing current psychosocial stressors, overall mood stability. Reviewed strategies to notice, accept and manage anxiety through use of intentional breathing and self soothing statements. Encouraged patient to decrease use of alcohol.  Provided support through active listening, validation of feelings, and highlighted patient's strengths.   Diagnosis:   ICD-10-CM   1. Depression, postpartum  O99.345    F53.0    Plan:  Not being so stressed with everything on her mind Treatment Target: Increase realistic balanced thinking   Explore patient's thoughts, beliefs, automatic thoughts, assumptions   Identify and replace thinking that leads to depression  Process distress and allow for emotional release   Cognitive reframing   Questioning and challenging thoughts Treatment Target: Reducing vulnerability to "emotional mind"  Values clarification   Self-care -nutrition, sleep, exercise   Increase positive events   Teach and practice mindfulness  Gratitude statements  Assertiveness communication  Patient to email to schedule next appointment due to new job and not knowing wrk schedule.   Future Appointments  Date Time Provider Department Center  05/29/2020  1:00 PM Kathreen Cosier, LCSW [facility] None     Kathreen Cosier, [place]

## 2020-05-29 ENCOUNTER — Ambulatory Visit: Payer: Medicaid Other | Admitting: Licensed Clinical Social Worker

## 2020-05-29 DIAGNOSIS — F53 Postpartum depression: Secondary | ICD-10-CM

## 2020-05-29 DIAGNOSIS — O99345 Other mental disorders complicating the puerperium: Secondary | ICD-10-CM

## 2020-05-29 NOTE — Progress Notes (Signed)
Counselor/Therapist Progress Note  Patient ID: Susan Cantrell, MRN: [id number],    Date: 05/29/2020  Time Spent: 38 minutes    Treatment Type: Psychotherapy  Reported Symptoms: overall mood stability   Mental Status Exam:  Appearance:   NA     Behavior:  Appropriate and Sharing  Motor:  NA  Speech/Language:   Clear and Coherent and Normal Rate  Affect:  NA  Mood:  euthymic  Thought process:  goal directed  Thought content:    WNL  Sensory/Perceptual disturbances:    WNL  Orientation:  oriented to person, place, time/date, situation and day of week  Attention:  Good  Concentration:  Good  Memory:  WNL  Fund of knowledge:   Good  Insight:    Good  Judgment:   Good  Impulse Control:  Good   Risk Assessment: Danger to Self:  No Self-injurious Behavior: No Danger to Others: No Duty to Warn:no Physical Aggression / Violence:No  Access to Firearms a concern: No  Gang Involvement:No   Subjective: Patient was engaged and cooperative throughout the session using time effectively to discuss thoughts and feelings. Patient voices she had discontinued her meds but has restarted because she feels benefit from meds. with her anxiety. Patient voices continued motivation for treatment and understanding of depression and anxiety symptoms . Patient is likely to benefit from future treatment because she remains motivated to decrease symptoms and improve functioning and reports benefit of regular sessions in addressing symptoms.   Interventions: Cognitive Behavioral Therapy Established psychological safety. Checked in with patient regarding her week. Engaged patient in processing current psychosocial stressors, overall improvement in mood. Discussed anxiety and use of coping skills, including journaling and use of mindfulness. Provided support through active listening, validation of feelings, and highlighted patient's strengths.   Diagnosis:   ICD-10-CM   1. Depression, postpartum  O99.345     F53.0    Plan: Not being so stressed with everything on her mind  Treatment Target: Increase realistic balanced thinking   Explore patient's thoughts, beliefs, automatic thoughts, assumptions   Identify and replace thinking that leads to depression  Process distress and allow for emotional release   Cognitive reframing   Questioning and challenging thoughts  Treatment Target: Reducing vulnerability to "emotional mind"  Values clarification   Self-care -nutrition, sleep, exercise   Increase positive events   Teach and practice mindfulness  Gratitude statements  Journaling   Assertiveness communication  Future Appointments  Date Time Provider Department Center  06/10/2020  1:00 PM Kathreen Cosier, LCSW [facility] None    Kathreen Cosier, LCSW

## 2020-06-10 ENCOUNTER — Ambulatory Visit: Payer: Medicaid Other | Admitting: Licensed Clinical Social Worker

## 2020-06-10 DIAGNOSIS — F53 Postpartum depression: Secondary | ICD-10-CM

## 2020-06-10 DIAGNOSIS — O99345 Other mental disorders complicating the puerperium: Secondary | ICD-10-CM

## 2020-06-10 NOTE — Progress Notes (Signed)
Counselor/Therapist Progress Note  Patient ID: Susan Cantrell, MRN: [id number],    Date: 06/10/2020  Time Spent: 41 minutes    Treatment Type: Psychotherapy  Reported Symptoms: low mood, anxiety, anxious thoughts   Mental Status Exam:  Appearance:   NA     Behavior:  Appropriate and Sharing  Motor:  NA  Speech/Language:   Clear and Coherent and Normal Rate  Affect:  Negative  Mood:  normal  Thought process:  normal  Thought content:    WNL  Sensory/Perceptual disturbances:    WNL  Orientation:  oriented to person, place, time/date, situation and day of week  Attention:  Good  Concentration:  Good  Memory:  WNL  Fund of knowledge:   Good  Insight:    Good  Judgment:   Good  Impulse Control:  Good   Risk Assessment: Danger to Self:  No Self-injurious Behavior: No Danger to Others: No Duty to Warn:no Physical Aggression / Violence:No  Access to Firearms a concern: No  Gang Involvement:No   Subjective: Patient was engaged and cooperative throughout the session using time effectively to discuss thoughts and feelings. Patient voices continued motivation for treatment and understanding of depression and anxiety symptoms due to relationship challenges. Patient is likely to benefit from future treatment because she remains motivated to decrease symptoms and improve functioning and reports benefit of regular sessions in addressing symptoms.    Interventions: Cognitive Behavioral Therapy Established psychological safety.  Engaged patient in processing current psychosocial stressors, increase anxiety and depressive symptoms due to romantic relationship challenges. Validated patient's feelings of sadness and anxiety, identifying origins of these feelings within this relaitonship. Shared information about journaling and encouraged patient to identify other options to engage in to reduce ruminating thoughts. Provided support through active listening, validation of feelings, and  highlighted patient's strengths.   Diagnosis:   ICD-10-CM   1. Depression, postpartum  O99.345    F53.0    Plan: Not being so stressed with everything on her mind  Treatment Target: Increase realistic balanced thinking   Explore patient's thoughts, beliefs, automatic thoughts, assumptions   Identify and replace thinking that leads to depression  Process distress and allow for emotional release   Cognitive reframing   Questioning and challenging thoughts  Treatment Target: Reducing vulnerability to "emotional mind"  Values clarification   Self-care -nutrition, sleep, exercise   Increase positive events   Teach and practice mindfulness  Gratitude statements  Journaling   Assertiveness communication  Future Appointments  Date Time Provider Department Center  06/23/2020  1:00 PM Kathreen Cosier, LCSW [facility] None    Kathreen Cosier, LCSW

## 2020-06-23 ENCOUNTER — Ambulatory Visit: Payer: Medicaid Other | Admitting: Licensed Clinical Social Worker

## 2020-06-23 DIAGNOSIS — F53 Postpartum depression: Secondary | ICD-10-CM

## 2020-06-23 DIAGNOSIS — O99345 Other mental disorders complicating the puerperium: Secondary | ICD-10-CM

## 2020-06-23 NOTE — Progress Notes (Signed)
Counselor/Therapist Progress Note  Patient ID: Susan Cantrell, MRN: [id number],    Date: 06/23/2020  Time Spent: 46 minutes    Treatment Type: Psychotherapy  Reported Symptoms:  overall mood stability, mild anxiety well controlled. Taking meds as prescribed  Mental Status Exam:  Appearance:   NA     Behavior:  Appropriate and Sharing  Motor:  NA  Speech/Language:   Clear and Coherent and Normal Rate  Affect:  NA  Mood:  euthymic  Thought process:  goal directed  Thought content:    WNL  Sensory/Perceptual disturbances:    WNL  Orientation:  oriented to person, place, time/date, situation, and day of week  Attention:  Good  Concentration:  Good  Memory:  WNL  Fund of knowledge:   Good  Insight:    Good  Judgment:   Good  Impulse Control:  Good   Risk Assessment: Danger to Self:  No Self-injurious Behavior: No Danger to Others: No Duty to Warn:no Physical Aggression / Violence:No  Access to Firearms a concern: No  Gang Involvement:No   Subjective: Patient was engaged and cooperative throughout the session using time effectively to discuss thoughts and feelings. Patient voices continued motivation for treatment and understanding of depression and anxiety issues.  Patient is likely to benefit from future treatment because she remains motivated to manage symptoms and improve functioning and reports benefit of regular sessions in addressing symptoms.   Interventions: Cognitive Behavioral Therapy Checked in with patient regarding her week. Reviewed previous session regarding relationship challenges, praised patient for increased emotional regulation and noticing thoughts. Engaged patient in processing current psychosocial stressors, overall mood improvement due to focusing on herself. Discussed patient's goal of focusing on her health and discussed options for patient to increase physical activity and to improve eating habits. Provided support through active listening, validation  of feelings, and highlighted patient's strengths.   Diagnosis:   ICD-10-CM   1. Depression, postpartum  O99.345    F53.0      Plan: Not being so stressed with everything on her mind    Treatment Target: Increase realistic balanced thinking Explore patient's thoughts, beliefs, automatic thoughts, assumptions Identify and replace thinking that leads to depression Process distress and allow for emotional release Cognitive reframing Questioning and challenging thoughts  Treatment Target: Reducing vulnerability to "emotional mind" Values clarification   Self-care - nutrition, sleep, exercise   Future Appointments  Date Time Provider Department Center  07/07/2020  1:00 PM Kathreen Cosier, LCSW [facility] None    Kathreen Cosier, LCSW

## 2020-06-27 IMAGING — US US OB LIMITED
1 series · 14 of 28 positions shown · non-contrast
Comparison: none

CLINICAL DATA: 28-year-old pregnant female presents with vaginal
bleeding.

EDC by [facility]MP: 03/29/2019, projecting to an expected gestational age of
16 weeks 3 days.
EXAM:
LIMITED OBSTETRIC ULTRASOUND

[Series 1: us ob limited · 14 of 30 slices shown]
[im 2/30]
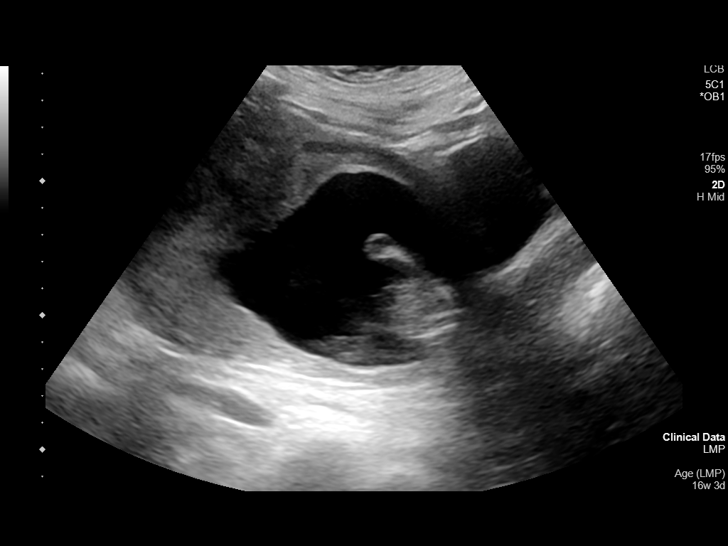
[im 4/30]
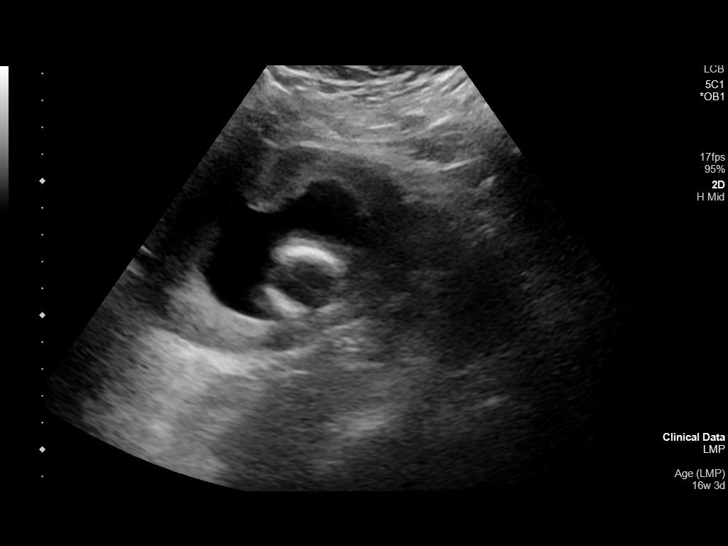
[im 6/30]
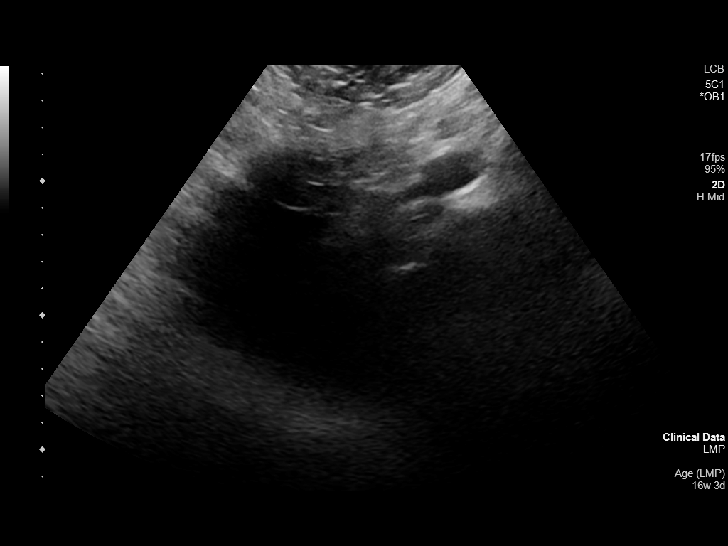
[im 8/30]
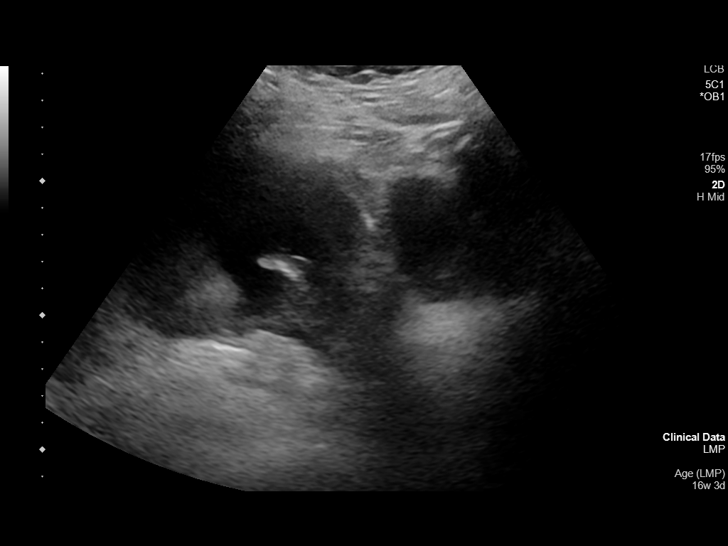
[im 10/30]
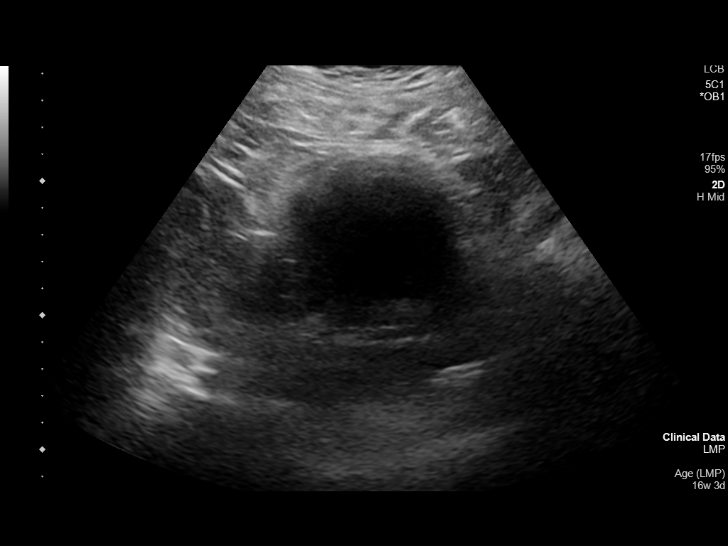
[im 12/30]
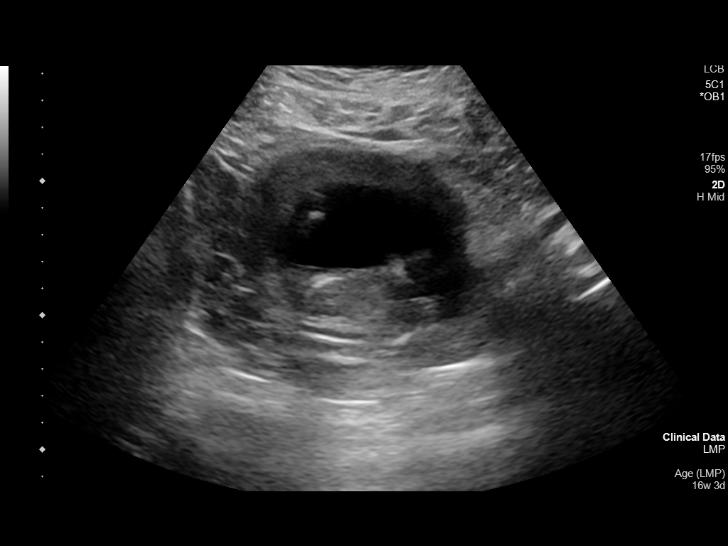
[im 14/30]
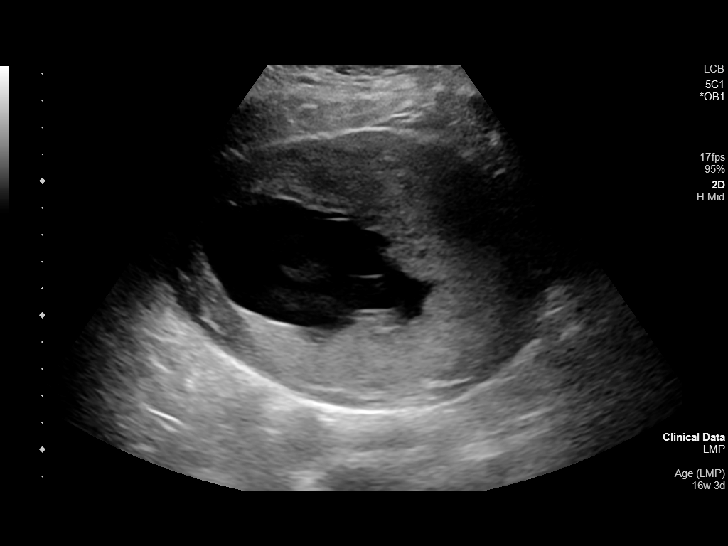
[im 17/30]
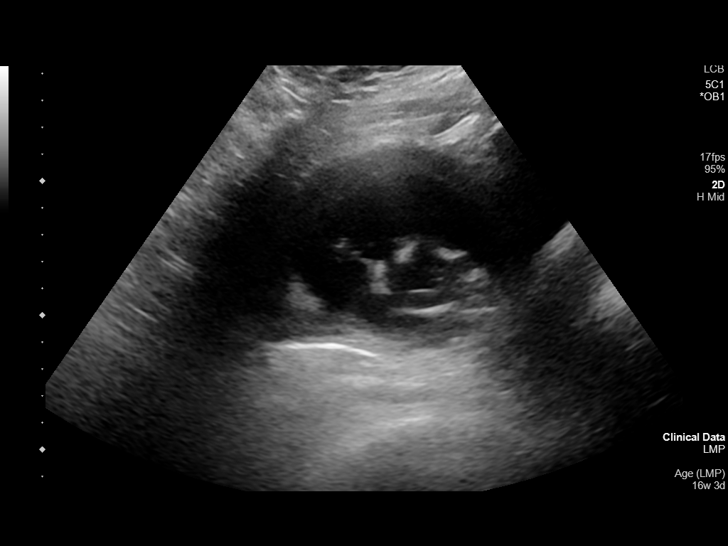
[im 19/30]
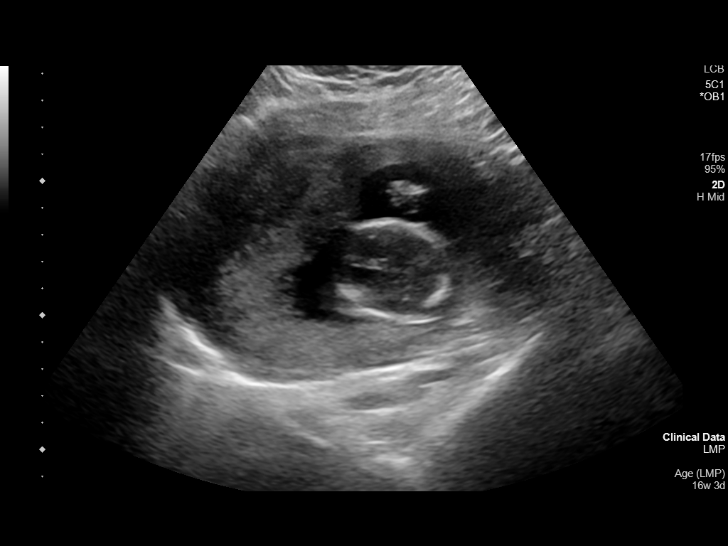
[im 21/30]
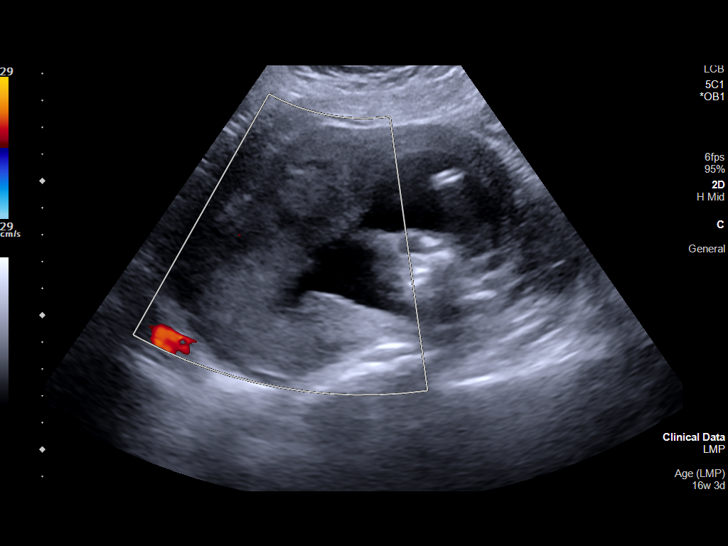
[im 23/30]
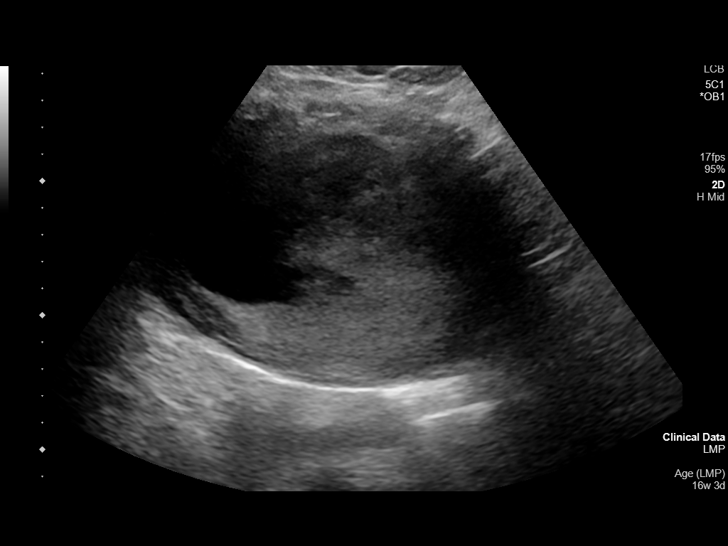
[im 25/30]
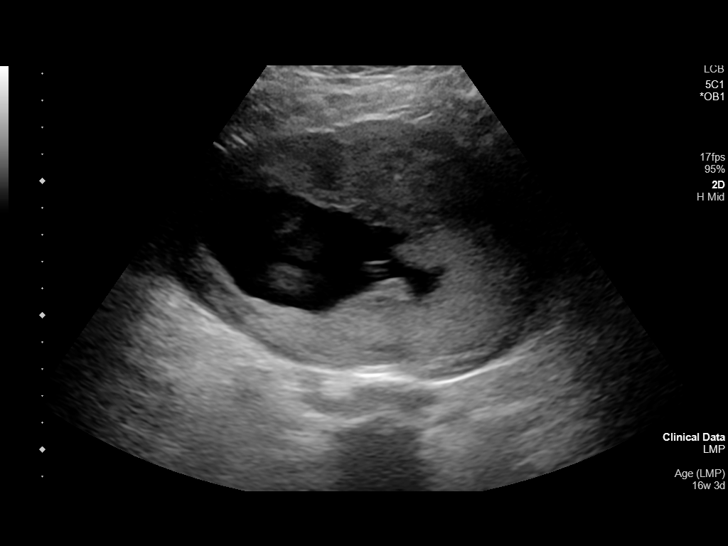
[im 27/30]
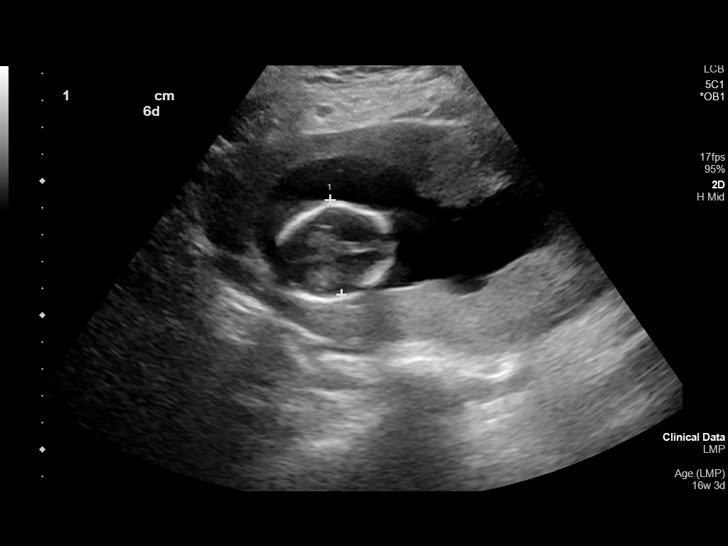
[im 30/30]
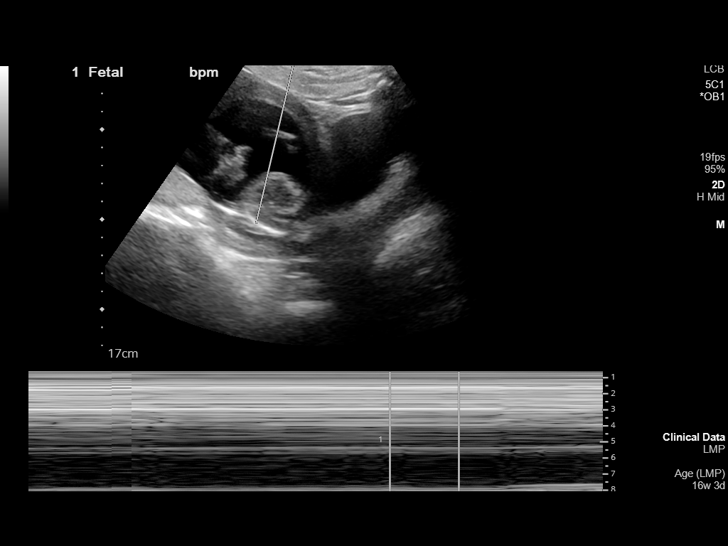

[14 of 28 positions shown; findings below may reference images not displayed]

FINDINGS: Number of Fetuses: 1

Heart Rate:  149 bpm

Movement: Yes

Presentation: Variable

Placental Location: Fundal

Previa: No

Amniotic Fluid (Subjective):  Within normal limits.

BPD: 3.5 cm 16 w  5 d

FL: 2.5 cm        17 w 4 d

AUA:    17 w 1 d     US EDD: 03/24/2019

MATERNAL FINDINGS:

Cervix: Poorly visualized on these transabdominal views, appears
closed.

Uterus/Adnexae: No abnormality visualized.
IMPRESSION: 1. Single living intrauterine gestation at 17 weeks 1 day by limited
fetal biometry, concordant with provided menstrual dating.
2. No acute gestational abnormality demonstrated. Patient is due for
dedicated outpatient fetal anatomic survey in 2-3 weeks.

This exam is performed on an emergent basis and does not
comprehensively evaluate fetal size, dating, or anatomy; follow-up
complete OB US should be considered if further fetal assessment is
warranted.

## 2020-07-07 ENCOUNTER — Ambulatory Visit: Payer: Medicaid Other | Admitting: Licensed Clinical Social Worker

## 2020-07-07 DIAGNOSIS — O99345 Other mental disorders complicating the puerperium: Secondary | ICD-10-CM

## 2020-07-07 DIAGNOSIS — F53 Postpartum depression: Secondary | ICD-10-CM

## 2020-07-07 NOTE — Progress Notes (Signed)
Counselor/Therapist Progress Note  Patient ID: Susan Cantrell, MRN: [id number],    Date: 07/07/2020  Time Spent: 40 minutes   Treatment Type: Psychotherapy  Reported Symptoms:  increased sadness, emotional    Mental Status Exam:  Appearance:   NA     Behavior:  Appropriate and Sharing  Motor:  NA  Speech/Language:   Clear and Coherent and Normal Rate  Affect:  NA  Mood:  sad  Thought process:  goal directed  Thought content:    WNL  Sensory/Perceptual disturbances:    WNL  Orientation:  oriented to person, place, time/date, and situation  Attention:  Good  Concentration:  Good  Memory:  WNL  Fund of knowledge:   Good  Insight:    Good  Judgment:   Good  Impulse Control:  Good   Risk Assessment: Danger to Self:  No Self-injurious Behavior: No Danger to Others: No Duty to Warn:no Physical Aggression / Violence:No  Access to Firearms a concern: No  Gang Involvement:No   Subjective: Patient was engaged and cooperative throughout the session using time effectively to discuss thoughts and feelings. Patient voices continued motivation for treatment and understanding of depression and anxiety issues. Patient is likely to benefit from future treatment because she remains motivated to manage symptoms and improve functioning  and reports benefit of regular sessions in addressing these symptoms.     Interventions: Cognitive Behavioral Therapy Established psychological safety. Checked-in with patient and engaged patient in processing current psychosocial stressors, increased emotions, sadness, and anxiety. Explored patient's perception of current stressors identifying triggers and validating patient's feelings of sadness related to miscarriage. Assisted patient in identifying activities she can engage in to help manage depressive symptoms, including walking the steps and cooking. Provided support through active listening, validation of feelings, and highlighted patient's strengths.    Diagnosis:   ICD-10-CM   1. Depression, postpartum  O99.345    F53.0      Plan: Not being so stressed with everything on her mind    Treatment Target: Increase realistic balanced thinking Explore patient's thoughts, beliefs, automatic thoughts, assumptions Identify and replace thinking that leads to depression Process distress and allow for emotional release Cognitive reframing Questioning and challenging thoughts   Treatment Target: Reducing vulnerability to "emotional mind" Values clarification   Self-care - nutrition, sleep, exercise   Future Appointments  Date Time Provider Department Center  07/15/2020  1:00 PM Kathreen Cosier, LCSW [facility] None    Kathreen Cosier, LCSW

## 2020-07-15 ENCOUNTER — Ambulatory Visit: Payer: Medicaid Other | Admitting: Licensed Clinical Social Worker

## 2020-07-16 ENCOUNTER — Ambulatory Visit: Payer: Medicaid Other | Admitting: Licensed Clinical Social Worker

## 2020-07-16 DIAGNOSIS — O99345 Other mental disorders complicating the puerperium: Secondary | ICD-10-CM

## 2020-07-16 DIAGNOSIS — F53 Postpartum depression: Secondary | ICD-10-CM

## 2020-07-16 NOTE — Progress Notes (Signed)
Counselor/Therapist Progress Note  Patient ID: Susan Cantrell, MRN: [id number],    Date: 07/16/2020  Time Spent: 42 minutes    Treatment Type: Psychotherapy  Reported Symptoms:  overall mood stability with intermittent sadness and anxiety  Mental Status Exam:  Appearance:   NA     Behavior:  Appropriate and Sharing  Motor:  NA  Speech/Language:   Clear and Coherent and Normal Rate  Affect:  NA  Mood:  euthymic  Thought process:  goal directed  Thought content:    WNL  Sensory/Perceptual disturbances:    WNL  Orientation:  oriented to person, place, time/date, situation, and day of week  Attention:  Good  Concentration:  Good  Memory:  WNL  Fund of knowledge:   Good  Insight:    Good  Judgment:   Good  Impulse Control:  Good   Risk Assessment: Danger to Self:  No Self-injurious Behavior: No Danger to Others: No Duty to Warn:no Physical Aggression / Violence:No  Access to Firearms a concern: No  Gang Involvement:No   Subjective: Patient participated and was cooperative throughout the session using time effectively to discuss thoughts and feelings. Patient voices use of coping skills, including journaling and increased self-care to manage depression and anxiety symptoms. Patient is likely to benefit from future treatment because she remains motivated to decrease symptoms and improve functioning and reports benefit of regular sessions in addressing symptoms.   Interventions: Cognitive Behavioral Therapy Established psychological safety. Checked in with patient regarding her week. Praised patient for progress made towards self-care goals / health related value. Explored patient's challenges related to intimate partner relationships, identifying possible consequences of behavior and problem solved strategies to decrease the behavior, such as self-care, engagement in supportive relationships and increased boundaries. Provided support through active listening, validation of  feelings, and highlighted patient's strengths.   Diagnosis:   ICD-10-CM   1. Depression, postpartum  O99.345    F53.0      Plan: Not being so stressed with everything on her mind    Treatment Target: Increase realistic balanced thinking Explore patient's thoughts, beliefs, automatic thoughts, assumptions Identify and replace thinking that leads to depression Process distress and allow for emotional release Cognitive reframing Questioning and challenging thoughts   Treatment Target: Reducing vulnerability to "emotional mind" Values clarification   Self-care - nutrition, sleep, exercise   Future Appointments  Date Time Provider Department Center  07/28/2020  1:00 PM Kathreen Cosier, LCSW [facility] None    Kathreen Cosier, LCSW

## 2020-07-28 ENCOUNTER — Ambulatory Visit: Payer: Medicaid Other | Admitting: Licensed Clinical Social Worker

## 2020-07-29 ENCOUNTER — Ambulatory Visit: Payer: Medicaid Other | Admitting: Licensed Clinical Social Worker

## 2020-07-29 DIAGNOSIS — O99345 Other mental disorders complicating the puerperium: Secondary | ICD-10-CM

## 2020-07-29 DIAGNOSIS — F53 Postpartum depression: Secondary | ICD-10-CM

## 2020-07-29 NOTE — Progress Notes (Signed)
Counselor/Therapist Progress Note  Patient ID: Susan Cantrell, MRN: [id number],    Date: 07/29/2020  Time Spent: 38 minutes    Treatment Type: Psychotherapy  Reported Symptoms:  mood stability  Mental Status Exam:  Appearance:   NA     Behavior:  Appropriate and Sharing  Motor:  NA  Speech/Language:   Clear and Coherent and Normal Rate  Affect:  NA  Mood:  euthymic  Thought process:  normal  Thought content:    WNL  Sensory/Perceptual disturbances:    WNL  Orientation:  oriented to person, place, time/date, situation, and day of week  Attention:  Good  Concentration:  Good  Memory:  WNL  Fund of knowledge:   Good  Insight:    Good  Judgment:   Good  Impulse Control:  Good   Risk Assessment: Danger to Self:  No Self-injurious Behavior: No Danger to Others: No Duty to Warn:no Physical Aggression / Violence:No  Access to Firearms a concern: No  Gang Involvement:No   Subjective: Patient was engaged and cooperative throughout the session using time effectively to discuss thoughts and feelings. She voices benefit from journaling regularly, and also using positive/realistic balanced self-talk. Patient is likely to benefit from future treatment because she remains motivated to manage symptoms and maintain functioning and reports benefit of regular sessions in addressing symptoms.   Interventions: Cognitive Behavioral Therapy Established psychological safety. Checked in with patient regarding her week. Provided supportive space encouraging emotional release and processing of current psychosocial stressors. Assisted patient in exploring options for increasing self-care. Provided support through active listening, validation of feelings, and highlighted patient's strengths.   Diagnosis:   ICD-10-CM   1. Depression, postpartum  O99.345    F53.0      Plan: Not being so stressed with everything on her mind    Treatment Target: Increase realistic balanced thinking Explore  patient's thoughts, beliefs, automatic thoughts, assumptions Identify and replace thinking that leads to depression Process distress and allow for emotional release Cognitive reframing Questioning and challenging thoughts   Treatment Target: Reducing vulnerability to "emotional mind" Values clarification   Self-care - nutrition, sleep, exercise   Future Appointments  Date Time Provider Department Center  08/12/2020  1:00 PM Kathreen Cosier, LCSW [facility] None    Kathreen Cosier, LCSW

## 2020-08-12 ENCOUNTER — Ambulatory Visit: Payer: Medicaid Other | Admitting: Licensed Clinical Social Worker

## 2020-08-12 DIAGNOSIS — O99345 Other mental disorders complicating the puerperium: Secondary | ICD-10-CM

## 2020-08-12 DIAGNOSIS — F53 Postpartum depression: Secondary | ICD-10-CM

## 2020-08-12 NOTE — Progress Notes (Signed)
Counselor/Therapist Progress Note  Patient ID: Susan Cantrell, MRN: [id number],    Date: 08/12/2020  Time Spent: 34 minutes    Treatment Type: Psychotherapy  Reported Symptoms:  stress, anxiety, anxious thoughts; drinking most days   Mental Status Exam:  Appearance:   NA     Behavior:  Appropriate and Sharing  Motor:  na  Speech/Language:   Clear and Coherent and Normal Rate  Affect:  NA  Mood:  normal  Thought process:  normal  Thought content:    WNL  Sensory/Perceptual disturbances:    WNL  Orientation:  oriented to person, place, time/date, and situation  Attention:  Good  Concentration:  Good  Memory:  WNL  Fund of knowledge:   Good  Insight:    Good  Judgment:   Good  Impulse Control:  Good   Risk Assessment: Danger to Self:  No Self-injurious Behavior: No Danger to Others: No Duty to Warn:no Physical Aggression / Violence:No  Access to Firearms a concern: No  Gang Involvement:No   Subjective: Patient was engaged and cooperative throughout the session using time effectively to discuss thoughts and feelings. Patient voices continued motivation for treatment and understanding of depression and anxiety. Patient is likely to benefit from future treatment because she remains motivated to decrease distress and reports benefit of regular sessions in addressing these symptoms.   Interventions: Cognitive Behavioral Therapy and Motivational Interviewing Established psychological safety. Checked in with patient regarding her week. Engaged patient in processing current psychosocial stressors, ongoing anxiety due to co-parenting challenges. Validated patient's feelings of frustration and anger, identifying origins of these feelings. Worked on increasing engagement in self-care, including engaging in positive supportive relationships. Provided support through active listening, validation of feelings, and highlighted patient's strengths.   Diagnosis:   ICD-10-CM   1. Depression,  postpartum  O99.345    F53.0      Plan: Not being so stressed with everything on her mind    Treatment Target: Increase realistic balanced thinking Explore patient's thoughts, beliefs, automatic thoughts, assumptions Identify and replace thinking that leads to depression Process distress and allow for emotional release Cognitive reframing Questioning and challenging thoughts   Treatment Target: Reducing vulnerability to "emotional mind" Values clarification   Self-care - nutrition, sleep, exercise    Future Appointments  Date Time Provider Department Center  09/02/2020  1:00 PM Kathreen Cosier, LCSW [facility] None    Kathreen Cosier, LCSW

## 2020-09-02 ENCOUNTER — Ambulatory Visit: Payer: Medicaid Other | Admitting: Licensed Clinical Social Worker

## 2020-09-02 DIAGNOSIS — F53 Postpartum depression: Secondary | ICD-10-CM

## 2020-09-02 DIAGNOSIS — O99345 Other mental disorders complicating the puerperium: Secondary | ICD-10-CM
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
## 2020-09-17 ENCOUNTER — Ambulatory Visit: Payer: Medicaid Other | Admitting: Licensed Clinical Social Worker

## 2020-09-17 DIAGNOSIS — F53 Postpartum depression: Secondary | ICD-10-CM

## 2020-09-17 DIAGNOSIS — O99345 Other mental disorders complicating the puerperium: Secondary | ICD-10-CM

## 2020-09-17 NOTE — Progress Notes (Signed)
Counselor/Therapist Progress Note  Patient ID: KIYA ENO, MRN: [id number],    Date: 09/17/2020  Time Spent: 45 minutes    Treatment Type: Psychotherapy  Reported Symptoms:  overwhelm, anxiety, anxious thoughts   Mental Status Exam:  Appearance:   NA     Behavior:  Appropriate and Sharing  Motor:  Normal  Speech/Language:   Clear and Coherent and Normal Rate  Affect:  NA  Mood:  dysthymic and sad  Thought process:  normal  Thought content:    WNL  Sensory/Perceptual disturbances:    WNL  Orientation:  oriented to person, place, time/date, situation, and day of week  Attention:  Good  Concentration:  Good  Memory:  WNL  Fund of knowledge:   Good  Insight:    Good  Judgment:   Good  Impulse Control:  Good   Risk Assessment: Danger to Self:  No Self-injurious Behavior: No Danger to Others: No Duty to Warn:no Physical Aggression / Violence:No  Access to Firearms a concern: No  Gang Involvement:No   Subjective: Patient was engaged and cooperative throughout the session using time effectively to discuss thoughts and feelings. Patient voices continued motivation for treatment and understanding of depression and anxiety issues. Patient is likely to benefit from future treatment because she remains motivated to manage symptoms and reports benefit of regular sessions in addressing these symptoms.   Interventions: Cognitive Behavioral Therapy Established psychological safety. Checked in with patient regarding her week. Engaged patient in discussing psychosocial stressors, increased anxiety due to multiple stressors. Highlighted unhelpful thoughts and reframed thoughts leading to increased stress. Explored patient's options to increase self-care and encouraged patient to continue regular exercise. Provided support through active listening, validation of feelings, and highlighted patient's strengths.    Diagnosis:   ICD-10-CM   1. Depression, postpartum  O99.345    F53.0       Plan: Not being so stressed with everything on her mind    Treatment Target: Increase realistic balanced thinking Explore patient's thoughts, beliefs, automatic thoughts, assumptions Identify and replace thinking that leads to depression Process distress and allow for emotional release Cognitive reframing Questioning and challenging thoughts   Treatment Target: Reducing vulnerability to "emotional mind" Values clarification   Self-care - nutrition, sleep, exercise  Problem solving   Future Appointments  Date Time Provider Department Center  09/23/2020  9:00 AM Kathreen Cosier, LCSW [facility] None     Kathreen Cosier, LCSW

## 2020-09-23 ENCOUNTER — Ambulatory Visit: Payer: Medicaid Other | Admitting: Licensed Clinical Social Worker

## 2020-09-29 ENCOUNTER — Ambulatory Visit: Payer: Medicaid Other | Admitting: Licensed Clinical Social Worker

## 2020-09-29 DIAGNOSIS — O99345 Other mental disorders complicating the puerperium: Secondary | ICD-10-CM

## 2020-09-29 DIAGNOSIS — F53 Postpartum depression: Secondary | ICD-10-CM

## 2020-09-29 NOTE — Progress Notes (Signed)
Counselor/Therapist Progress Note  Patient ID: Susan Cantrell, MRN: [id number],    Date: 09/29/2020  Time Spent: 45 minutes   Treatment Type: Psychotherapy  Reported Symptoms: Feelings of Worthlessness, Hopelessness, Anhedonia, and depressed mood; anxiety, anxious thoughts , passive suicidal ideation, denies plan or intent  Mental Status Exam:  Appearance:   NA     Behavior:  Appropriate and Sharing  Motor:  NA  Speech/Language:   Clear and Coherent and Normal Rate  Affect:  NA  Mood:  dysthymic and sad  Thought process:  normal  Thought content:    WNL  Sensory/Perceptual disturbances:    WNL  Orientation:  oriented to person, place, time/date, situation, and day of week  Attention:  Good  Concentration:  Good  Memory:  WNL  Fund of knowledge:   Good  Insight:    Good  Judgment:   Good  Impulse Control:  Good   Risk Assessment: Danger to Self:  No patient voices passive suicidal ideation, denies plan or intent to harm herself.  Self-injurious Behavior: No Danger to Others: No Duty to Warn:no Physical Aggression / Violence:No  Access to Firearms a concern: No  Gang Involvement:No   Subjective: Patient was engaged and cooperative throughout the session using time effectively to discuss thoughts and feelings. Patient voices continued motivation for treatment and understanding of depression and anxiety. Patient is likely to benefit from future treatment because she remains motivated to decrease  depression and anxiety and reports benefit of regular sessions in addressing these symptoms.    Interventions: Cognitive Behavioral Therapy Established psychological safety. Checked in with patient. Provided supportive space encouraging emotional release and processing of current psychosocial stressors, increased depressive symptoms and anxiety due multiple stressors.  Discussed options for patient to increase coping skills, including using distractions, mindfulness, and taught  patient about Radical Acceptance. Provided support through active listening, validation of feelings, and highlighted patient's strengths.   Diagnosis:   ICD-10-CM   1. Depression, postpartum  O99.345    F53.0      Plan: Not being so stressed with everything on her mind    Treatment Target: Increase realistic balanced thinking Explore patient's thoughts, beliefs, automatic thoughts, assumptions Identify and replace thinking that leads to depression Process distress and allow for emotional release Cognitive reframing Questioning and challenging thoughts   Treatment Target: Reducing vulnerability to "emotional mind" Values clarification   Self-care - nutrition, sleep, exercise  Problem solving   Future Appointments  Date Time Provider Department Center  10/08/2020  1:00 PM Kathreen Cosier, LCSW [facility] None    Kathreen Cosier, LCSW

## 2020-10-08 ENCOUNTER — Ambulatory Visit: Payer: Medicaid Other | Admitting: Licensed Clinical Social Worker

## 2020-10-08 DIAGNOSIS — O99345 Other mental disorders complicating the puerperium: Secondary | ICD-10-CM

## 2020-10-08 DIAGNOSIS — F53 Postpartum depression: Secondary | ICD-10-CM
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
## 2020-10-21 ENCOUNTER — Ambulatory Visit: Payer: Medicaid Other | Admitting: Licensed Clinical Social Worker

## 2020-10-21 DIAGNOSIS — F53 Postpartum depression: Secondary | ICD-10-CM
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
## 2020-11-04 ENCOUNTER — Ambulatory Visit: Payer: Medicaid Other | Admitting: Licensed Clinical Social Worker

## 2020-11-04 DIAGNOSIS — F53 Postpartum depression: Secondary | ICD-10-CM

## 2020-11-04 NOTE — Progress Notes (Signed)
Counselor/Therapist Progress Note  Patient ID: Susan Cantrell, MRN: [id number],    Date: 11/04/2020  Time Spent: 44 minutes    Treatment Type: Psychotherapy  Reported Symptoms:  low energy, intermittent- low mood and sadness; mild anxiety   Mental Status Exam:  Appearance:   NA     Behavior:  Appropriate and Sharing  Motor:  NA  Speech/Language:   Clear and Coherent and Normal Rate  Affect:  NA  Mood:  normal  Thought process:  normal  Thought content:    WNL  Sensory/Perceptual disturbances:    WNL  Orientation:  oriented to person, place, time/date, situation, and day of week  Attention:  Good  Concentration:  Good  Memory:  WNL  Fund of knowledge:   Good  Insight:    Good  Judgment:   Good  Impulse Control:  Good   Risk Assessment: Danger to Self:  No Self-injurious Behavior: No Danger to Others: No Duty to Warn:no Physical Aggression / Violence:No  Access to Firearms a concern: No  Gang Involvement:No   Subjective: Patient was engaged and cooperative throughout the session using time effectively to discuss thoughts and feelings. Patient voices overall stability. Patient is likely to benefit from future treatment because she remains motivated to manage symptoms and reports benefit of regular sessions in addressing symptoms.   Interventions: Cognitive Behavioral Therapy Checked in with patient regarding her week. Reviewed previous session regarding following up with health care provider. LCSW assisted patient in processing their thoughts and emotions about what they are experiencing related to relationship challenges and parenting challenges as a single parent. LCSW reviewed strategies to increase self-care, such as engaging with positive peers, going on a walk or spending quality time with her children. Provided support through active listening, validation of feelings, and highlighted patient's strengths.   Diagnosis:   ICD-10-CM   1. Depression, postpartum  F53.0       Plan: Not being so stressed with everything on her mind    Treatment Target: Increase realistic balanced thinking Explore patient's thoughts, beliefs, automatic thoughts, assumptions Identify and replace thinking that leads to depression Process distress and allow for emotional release Cognitive reframing Questioning and challenging thoughts   Treatment Target: Reducing vulnerability to "emotional mind" Values clarification   Self-care - nutrition, sleep, exercise  Problem solving   Future Appointments  Date Time Provider Department Center  11/18/2020  1:00 PM Kathreen Cosier, LCSW [facility] None    Kathreen Cosier, LCSW

## 2020-11-18 ENCOUNTER — Ambulatory Visit: Payer: Medicaid Other | Admitting: Licensed Clinical Social Worker

## 2020-11-24 ENCOUNTER — Ambulatory Visit: Payer: Medicaid Other | Admitting: Licensed Clinical Social Worker

## 2020-11-24 DIAGNOSIS — F53 Postpartum depression: Secondary | ICD-10-CM

## 2020-11-24 NOTE — Progress Notes (Signed)
Counselor/Therapist Progress Note  Patient ID: Susan Cantrell, MRN: [id number],    Date: 11/24/2020  Time Spent: 45 minutes    Treatment Type: Psychotherapy  Reported Symptoms:  overall stable mood with intermittent low mood; sleep disturbance  Mental Status Exam:  Appearance:   NA     Behavior:  Appropriate and Sharing  Motor:  Normal  Speech/Language:   Normal Rate  Affect:  NA  Mood:  normal  Thought process:  normal  Thought content:    WNL  Sensory/Perceptual disturbances:    WNL  Orientation:  oriented to person, place, time/date, and situation  Attention:  Good  Concentration:  Good  Memory:  WNL  Fund of knowledge:   Good  Insight:    Good  Judgment:   Good  Impulse Control:  Good   Risk Assessment: Danger to Self:  No Self-injurious Behavior: No Danger to Others: No Duty to Warn:no Physical Aggression / Violence:No  Access to Firearms a concern: No  Gang Involvement:No   Subjective: Patient was engaged and cooperative throughout the session using time effectively to discuss thoughts and feelings.  Patient was receptive to feedback and intervention from LCSW.  Patient is likely to benefit from future treatment because they remain motivated to decrease depressive symptoms and anxiety and reports benefit of regular sessions. Patient reports benefit from taking medication as prescribed.      Interventions: Cognitive Behavioral Therapy Checked in with patient regarding her week. Engaged patient in processing current psychosocial stressors, overall mood stability mild psychosocial stressors- sleep disturbance; worries regarding father's health. Provided psychoeducation on sleep hygiene, including having a set consistent sleep schedule, using bed for sleep only, limiting/stopping caffeine, and having calming activity before bed. Provided support through active listening, validation of feelings, and highlighted patient's strengths.   Diagnosis:   ICD-10-CM   1.  Depression, postpartum  F53.0      Plan: Not being so stressed with everything on her mind    Treatment Target: Increase realistic balanced thinking Explore patient's thoughts, beliefs, automatic thoughts, assumptions Identify and replace thinking that leads to depression Process distress and allow for emotional release Cognitive reframing Questioning and challenging thoughts   Treatment Target: Reducing vulnerability to "emotional mind" Values clarification   Self-care - nutrition, sleep, exercise  Problem solving   Future Appointments  Date Time Provider Department Center  12/15/2020  1:00 PM Kathreen Cosier, LCSW [facility] None    Kathreen Cosier, LCSW

## 2020-12-09 ENCOUNTER — Emergency Department: Payer: Medicaid Other

## 2020-12-09 ENCOUNTER — Emergency Department
Admission: EM | Admit: 2020-12-09 | Discharge: 2020-12-10 | Discharge status: Against Medical Advice | Payer: Medicaid Other | Attending: Emergency Medicine | Admitting: Emergency Medicine

## 2020-12-09 ENCOUNTER — Other Ambulatory Visit: Payer: Self-pay

## 2020-12-09 ENCOUNTER — Encounter: Payer: Self-pay | Admitting: Intensive Care

## 2020-12-09 DIAGNOSIS — R197 Diarrhea, unspecified: Secondary | ICD-10-CM | POA: Diagnosis not present

## 2020-12-09 DIAGNOSIS — Z5321 Procedure and treatment not carried out due to patient leaving prior to being seen by health care provider: Secondary | ICD-10-CM | POA: Insufficient documentation

## 2020-12-09 DIAGNOSIS — R112 Nausea with vomiting, unspecified: Secondary | ICD-10-CM | POA: Insufficient documentation

## 2020-12-09 DIAGNOSIS — R1013 Epigastric pain: Secondary | ICD-10-CM | POA: Diagnosis present

## 2020-12-09 DIAGNOSIS — R109 Unspecified abdominal pain: Secondary | ICD-10-CM

## 2020-12-09 LAB — URINALYSIS, ROUTINE W REFLEX MICROSCOPIC
Bacteria, UA: NONE SEEN
RBC / HPF: >50 RBC/hpf — ABNORMAL HIGH (ref 0–5)
Specific Gravity, Urine: 1.031 — ABNORMAL HIGH (ref 1.005–1.030)
Squamous Epithelial / HPF: >50 — ABNORMAL HIGH (ref 0–5)
WBC, UA: >50 WBC/hpf — ABNORMAL HIGH (ref 0–5)

## 2020-12-09 LAB — LIPASE, BLOOD: Lipase: 32 U/L (ref 11–51)

## 2020-12-09 LAB — COMPREHENSIVE METABOLIC PANEL
ALT: 45 U/L — ABNORMAL HIGH (ref 0–44)
AST: 30 U/L (ref 15–41)
Albumin: 3.7 g/dL (ref 3.5–5.0)
Alkaline Phosphatase: 98 U/L (ref 38–126)
Anion gap: 6 (ref 5–15)
BUN: 10 mg/dL (ref 6–20)
CO2: 24 mmol/L (ref 22–32)
Calcium: 9.4 mg/dL (ref 8.9–10.3)
Chloride: 107 mmol/L (ref 98–111)
Creatinine, Ser: 0.57 mg/dL (ref 0.44–1.00)
GFR, Estimated: >60 mL/min (ref >60)
Glucose, Bld: 97 mg/dL (ref 70–99)
Potassium: 3.7 mmol/L (ref 3.5–5.1)
Sodium: 137 mmol/L (ref 135–145)
Total Bilirubin: 0.6 mg/dL (ref 0.3–1.2)
Total Protein: 8 g/dL (ref 6.5–8.1)

## 2020-12-09 LAB — CBC
HCT: 40.6 % (ref 36.0–46.0)
Hemoglobin: 13.3 g/dL (ref 12.0–15.0)
MCH: 28.5 pg (ref 26.0–34.0)
MCHC: 32.8 g/dL (ref 30.0–36.0)
MCV: 86.9 fL (ref 80.0–100.0)
Platelets: 455 10*3/uL — ABNORMAL HIGH (ref 150–400)
RBC: 4.67 MIL/uL (ref 3.87–5.11)
RDW: 13.8 % (ref 11.5–15.5)
WBC: 9.1 10*3/uL (ref 4.0–10.5)
nRBC: 0 % (ref 0.0–0.2)

## 2020-12-09 LAB — PREGNANCY, URINE: Preg Test, Ur: NEGATIVE

## 2020-12-09 MED ORDER — ONDANSETRON 4 MG PO TBDP
4.0000 mg | ORAL_TABLET | Freq: Once | ORAL | Status: AC | PRN
  Administered 2020-12-09: 4 mg via ORAL

## 2020-12-09 NOTE — ED Provider Notes (Signed)
Emergency Medicine Provider Triage Evaluation Note  Susan Cantrell , a 30 y.o. female  was evaluated in triage.  Pt complains of epigastric abdominal pain and vomiting that started yesterday.  Patient states that she feels as though she has "chest heaviness.  Denies chest tightness or shortness of breath.  Denies fever.  Denies experiencing similar pain in the past.  Review of Systems  Positive: Patient has epigastric abdominal pain.  Negative: No chest tightness or SOB.   Physical Exam  BP 125/83 (BP Location: Right Arm)   Pulse (!) 58   Temp 98.6 F (37 C) (Oral)   Resp 20   Ht 5\' 8"  (1.727 m)   Wt (!) 152 kg   LMP 12/08/2020 (Exact Date)   SpO2 96%   Breastfeeding No   BMI 50.94 kg/m  Gen:   Awake, no distress   Resp:  Normal effort  MSK:   Moves extremities without difficulty  Other:    Medical Decision Making  Medically screening exam initiated at 6:20 PM.  Appropriate orders placed.  Susan Cantrell was informed that the remainder of the evaluation will be completed by another provider, this initial triage assessment does not replace that evaluation, and the importance of remaining in the ED until their evaluation is complete.     Montez Morita [place], PA-C 12/09/20 12/11/20, MD 12/09/20 1925

## 2020-12-09 NOTE — ED Triage Notes (Signed)
Patient c/o epigastric pain that started yesterday. Reports it feels like something heavy is sitting on her chest. Reports N/V/D.Denies injury

## 2020-12-10 LAB — POC URINE PREG, ED: Preg Test, Ur: NEGATIVE

## 2020-12-10 NOTE — ED Notes (Signed)
No answer when called several times from lobby 

## 2020-12-15 ENCOUNTER — Ambulatory Visit: Payer: Medicaid Other | Admitting: Licensed Clinical Social Worker

## 2020-12-15 DIAGNOSIS — F53 Postpartum depression: Secondary | ICD-10-CM

## 2020-12-15 NOTE — Progress Notes (Signed)
Counselor/Therapist Progress Note  Patient ID: Susan Cantrell, MRN: [id number],    Date: 12/15/2020  Time Spent: 51 minutes    Treatment Type: Psychotherapy  Reported Symptoms:  Depressed mood, sadness, crying; Anxiety, anxious thoughts discontinued medication  Mental Status Exam:  Appearance:   NA     Behavior:  Appropriate, Sharing, and Agitated  Motor:  NA  Speech/Language:   Normal Rate  Affect:  NA  Mood:  depressed  Thought process:  normal  Thought content:    WNL  Sensory/Perceptual disturbances:    WNL  Orientation:  oriented to person, place, time/date, and situation  Attention:  Good  Concentration:  Good  Memory:  WNL  Fund of knowledge:   Good  Insight:    Good  Judgment:   Good  Impulse Control:  Good   Risk Assessment: Danger to Self:  No Self-injurious Behavior: No Danger to Others: No Duty to Warn:no Physical Aggression / Violence:No  Access to Firearms a concern: No  Gang Involvement:No   Subjective: Patient was engaged and cooperative throughout the session using time effectively to discuss thoughts and feelings. Patient was receptive to feedback and intervention from LCSW. Patient is likely to benefit from future treatment because they remain motivated to decrease depression and anxiety and reports benefit of regular sessions.      Interventions: Cognitive Behavioral Therapy and Behavior Activation Established psychological safety. Checked in with patient regarding current psychosocial stressors. LCSW assisted patient in processing their thoughts and emotions about what they have recently experienced related to their health, and challenges in accessing health care and with intimate partner relationship. LCSW and patient discussed engagement in meaningful and pleasurable activities to help manage depressed mood. LCSW began discussing termination. Provided support through active listening, validation of feelings, and highlighted patient's strengths.  Informed patient of PTO and reviewed crisis line contact information.   Diagnosis:   ICD-10-CM   1. Depression, postpartum  F53.0      Plan: Not being so stressed with everything on her mind    Treatment Target: Increase realistic balanced thinking Explore patient's thoughts, beliefs, automatic thoughts, assumptions Identify and replace thinking that leads to depression Process distress and allow for emotional release Cognitive reframing Questioning and challenging thoughts   Treatment Target: Reducing vulnerability to "emotional mind" Values clarification   Self-care - nutrition, sleep, exercise  Problem solving  Planned activities   Future Appointments  Date Time Provider Department Center  01/14/2021  9:30 AM Kathreen Cosier, LCSW [facility] None    Kathreen Cosier, LCSW

## 2021-01-11 NOTE — L&D Delivery Note (Signed)
Delivery Note  Susan Cantrell is a [id number] at [redacted]w[redacted]d with an LMP of 04/04/21, consistent with [place] at [redacted]w[redacted]d.   First Stage: Labor onset: 0400 Induction: oxytocin and AROM Analgesia /Anesthesia intrapartum: Epidrual AROM at 1946 GBS: positive IP Antibiotics: Ancef  Second Stage: Complete dilation at 0854 Onset of pushing at 0904 FHR second stage 140bpm with moderate, no decels with pushing   Susan Cantrell presented to L&D with GHTN. She was 2/50/-1. She progressed  to C/C/+2 with a spontaneus urge to push.  She pushed  effectively over approximately 1 minute for a spontaneous vaginal birth.  Delivery of a viable baby boy on 12/29/21 at 0905 by CNM Delivery of fetal head in OA position with restitution to LOA. no nuchal cord;  Anterior then posterior shoulders delivered easily with gentle downward traction. Baby placed on mom's chest, and attended to by baby RN Cord double clamped after cessation of pulsation, cut by FOB  Cord blood sample collection: Not Indicated A POS Collection of cord blood donation no Arterial cord blood sample N/A  Third Stage: Oxytocin bolus started after delivery of infant for hemorrhage prophylaxis  Placenta delivered schultz intact with 3 VC @ 0913 Placenta disposition: discarded per protocol Uterine tone firm / bleeding small  no laceration identified  Anesthesia for repair: N/A Repair N/A Est. Blood Loss (mL): 100  Complications: none  Mom to postpartum.  Baby to Couplet care / Skin to Skin.  Newborn: Information for the patient's newborn:  Kelse, Ploch [[id number]]  Live born female "Charlie" Birth Weight:   APGAR: 9, 9  Newborn Delivery   Birth date/time: 12/29/2019 09:05:00 Delivery type: Vaginal, Spontaneous       Feeding planned: formula feeding  ---------- Susan Cantrell, CNM Certified Nurse Midwife [facility]

## 2021-01-14 ENCOUNTER — Ambulatory Visit: Payer: Medicaid Other | Admitting: Licensed Clinical Social Worker

## 2021-02-25 LAB — OB RESULTS CONSOLE GC/CHLAMYDIA
Chlamydia: NEGATIVE
Neisseria Gonorrhea: NEGATIVE

## 2021-06-01 ENCOUNTER — Emergency Department
Admission: EM | Admit: 2021-06-01 | Discharge: 2021-06-01 | Disposition: A | Discharge status: Home / Self Care | Payer: Medicaid Other | Attending: Emergency Medicine | Admitting: Emergency Medicine

## 2021-06-01 ENCOUNTER — Encounter: Payer: Self-pay | Admitting: Emergency Medicine

## 2021-06-01 ENCOUNTER — Other Ambulatory Visit: Payer: Self-pay

## 2021-06-01 DIAGNOSIS — R030 Elevated blood-pressure reading, without diagnosis of hypertension: Secondary | ICD-10-CM | POA: Diagnosis not present

## 2021-06-01 DIAGNOSIS — Z3A08 8 weeks gestation of pregnancy: Secondary | ICD-10-CM | POA: Insufficient documentation

## 2021-06-01 DIAGNOSIS — O26891 Other specified pregnancy related conditions, first trimester: Secondary | ICD-10-CM | POA: Insufficient documentation

## 2021-06-01 DIAGNOSIS — L509 Urticaria, unspecified: Secondary | ICD-10-CM

## 2021-06-01 DIAGNOSIS — O99891 Other specified diseases and conditions complicating pregnancy: Secondary | ICD-10-CM | POA: Insufficient documentation

## 2021-06-01 MED ORDER — HYDROCORTISONE 2.5 % EX CREA: TOPICAL_CREAM | Freq: Two times a day (BID) | CUTANEOUS | 0 refills | Status: DC

## 2021-06-01 MED ORDER — ONDANSETRON 4 MG PO TBDP
ORAL_TABLET | ORAL | Status: AC
  Filled 2021-06-01: qty 1

## 2021-06-01 MED ORDER — ONDANSETRON 4 MG PO TBDP
4.0000 mg | ORAL_TABLET | Freq: Once | ORAL | Status: AC
  Administered 2021-06-01: 4 mg via ORAL

## 2021-06-01 NOTE — ED Notes (Signed)
Pt declined labs until seen by provider.

## 2021-06-01 NOTE — ED Notes (Signed)
See triage note  presents with possible allergic rxn  states she developed some itching and rash  has tried

## 2021-06-01 NOTE — ED Notes (Signed)
See triage note  presents with possible allergic rxn   sxs' started on Thursday  has been seen by UC   and tried OTC meds w/o relief

## 2021-06-01 NOTE — ED Triage Notes (Signed)
Pt presents via POV with complaints of an allergic reaction and hives for the last week. Visible hives on her face, torso, and extremities. Pt is [redacted] weeks pregnant and was seen by UC who recommended benadryl and claritin without improvement. Pt took benadryl last night at 2200 without improvement in sx. Respirations are equal and unlabored.

## 2021-06-01 NOTE — ED Provider Notes (Signed)
[facility] Provider Note    Event Date/Time   First MD Initiated Contact with Patient 06/01/21 [phone]     (approximate)   History   Chief Complaint Allergic Reaction   HPI  Susan Cantrell is a 31 y.o. female, G6 P2-0-3-2 at approximately 8 weeks of pregnancy, presents to the ED complaining of rash.  Patient reports that for the past 3 days she has been dealing with intermittent raised itchy rash over her chest, arms, legs, face, and back.  She states that the rash is extremely itchy and makes it hard for her to sleep, but she denies any fevers, skin peeling, drainage, or pain.  She was initially seen at urgent care 3 days ago and diagnosed with hives, subsequently told to take antihistamine, which she states has not been alleviating her symptoms.  She is not aware of any new foods, medications, soaps, or detergents that may have triggered the rash.  She denies any nausea, vomiting, diarrhea, lightheadedness, or difficulty breathing.  She denies history of similar symptoms in the past with any particular exposure.  Patient denies any issues with her pregnancy thus far, has been following with [facility] for obstetric care.     Physical Exam   Triage Vital Signs: ED Triage Vitals  Enc Vitals Group     BP 06/01/21 0659 (!) 123/103     Pulse Rate 06/01/21 0659 (!) 104     Resp 06/01/21 0659 20     Temp 06/01/21 0659 98.6 F (37 C)     Temp Source 06/01/21 0659 Oral     SpO2 06/01/21 0659 99 %     Weight 06/01/21 0656 (!) 337 lb 4.9 oz (153 kg)     Height 06/01/21 0656 5\' 8"  (1.727 m)     Head Circumference --      Peak Flow --      Pain Score --      Pain Loc --      Pain Edu? --      Excl. in [place]? --     Most recent vital signs: Vitals:   06/01/21 0659 06/01/21 0746  BP: (!) 123/103 120/90  Pulse: (!) 104 98  Resp: 20 18  Temp: 98.6 F (37 C)   SpO2: 99% 99%    Constitutional: Alert and oriented. Eyes: Conjunctivae are normal. Head:  Atraumatic. Nose: No congestion/rhinnorhea. Mouth/Throat: Mucous membranes are moist.  Cardiovascular: Normal rate, regular rhythm. Grossly normal heart sounds.  2+ radial pulses bilaterally. Respiratory: Normal respiratory effort.  No retractions. Lungs CTAB. Gastrointestinal: Soft and nontender. No distention. Musculoskeletal: No lower extremity tenderness nor edema.  Skin: Diffuse hives noted to chest, arms, and neck. Neurologic:  Normal speech and language. No gross focal neurologic deficits are appreciated.    ED Results / Procedures / Treatments   Labs (all labs ordered are listed, but only abnormal results are displayed) Labs Reviewed - No data to display   PROCEDURES:  Critical Care performed: No  Procedures   MEDICATIONS ORDERED IN ED: Medications  ondansetron (ZOFRAN-ODT) disintegrating tablet 4 mg (4 mg Oral Not Given 06/01/21 0707)     IMPRESSION / MDM / [place] / ED COURSE  I reviewed the triage vital signs and the nursing notes.                              31 y.o. female, G6 P2-0-3-2 at approximately 8  weeks of pregnancy, who presents to the ED complaining of itchy rash over much of her body starting 3 days ago it occurred intermittently since the road.  Differential diagnosis includes, but is not limited to, anaphylaxis, hives, contact dermatitis.  Patient well-appearing and in no acute distress, vital signs remarkable for mildly elevated blood pressure but patient states this was noted at her prior OB visit and is currently being followed.  Given she is less than [redacted] weeks pregnant, no reason to suspect preeclampsia.  Current symptoms are consistent with hives, no findings to suggest anaphylaxis.  Patient was advised to wash all of her clothes and sheets given unclear trigger for her symptoms.  She was counseled to continue over-the-counter antihistamines and we will also prescribe low potency topical steroid, as these have been found to be safe in  pregnancy.  She was counseled to avoid using steroid cream on her face and to follow-up with her PCP, otherwise return to the ED for new worsening symptoms.  Patient agrees with plan.      FINAL CLINICAL IMPRESSION(S) / ED DIAGNOSES   Final diagnoses:  Urticaria     Rx / DC Orders   ED Discharge Orders          Ordered    hydrocortisone 2.5 % cream  2 times daily        06/01/21 [id number]             Note:  This document was prepared using Dragon voice recognition software and may include unintentional dictation errors.   Blake Divine, MD 06/01/21 [phone]

## 2021-06-17 DIAGNOSIS — O0993 Supervision of high risk pregnancy, unspecified, third trimester: Secondary | ICD-10-CM

## 2021-06-17 HISTORY — DX: Supervision of high risk pregnancy, unspecified, third trimester: O09.93

## 2021-06-18 LAB — OB RESULTS CONSOLE HEPATITIS B SURFACE ANTIGEN: Hepatitis B Surface Ag: NEGATIVE

## 2021-06-18 LAB — OB RESULTS CONSOLE VARICELLA ZOSTER ANTIBODY, IGG: Varicella: IMMUNE

## 2021-06-18 LAB — OB RESULTS CONSOLE RUBELLA ANTIBODY, IGM: Rubella: IMMUNE

## 2021-06-19 ENCOUNTER — Other Ambulatory Visit: Payer: Self-pay | Admitting: Obstetrics and Gynecology

## 2021-06-19 DIAGNOSIS — Z3689 Encounter for other specified antenatal screening: Secondary | ICD-10-CM

## 2021-08-11 ENCOUNTER — Other Ambulatory Visit: Payer: Self-pay

## 2021-08-11 ENCOUNTER — Ambulatory Visit: Payer: Medicaid Other | Attending: Obstetrics and Gynecology

## 2021-08-11 DIAGNOSIS — O99332 Smoking (tobacco) complicating pregnancy, second trimester: Secondary | ICD-10-CM | POA: Diagnosis not present

## 2021-08-11 DIAGNOSIS — F17210 Nicotine dependence, cigarettes, uncomplicated: Secondary | ICD-10-CM | POA: Diagnosis not present

## 2021-08-11 DIAGNOSIS — O99212 Obesity complicating pregnancy, second trimester: Secondary | ICD-10-CM | POA: Insufficient documentation

## 2021-08-11 DIAGNOSIS — Z3A18 18 weeks gestation of pregnancy: Secondary | ICD-10-CM | POA: Diagnosis not present

## 2021-08-11 DIAGNOSIS — Z363 Encounter for antenatal screening for malformations: Secondary | ICD-10-CM | POA: Diagnosis not present

## 2021-08-11 DIAGNOSIS — Z3689 Encounter for other specified antenatal screening: Secondary | ICD-10-CM

## 2021-08-27 DIAGNOSIS — O99332 Smoking (tobacco) complicating pregnancy, second trimester: Secondary | ICD-10-CM | POA: Insufficient documentation

## 2021-08-30 ENCOUNTER — Encounter: Payer: Self-pay | Admitting: Emergency Medicine

## 2021-08-30 ENCOUNTER — Other Ambulatory Visit: Payer: Self-pay

## 2021-08-30 ENCOUNTER — Emergency Department: Payer: Medicaid Other

## 2021-08-30 ENCOUNTER — Emergency Department
Admission: EM | Admit: 2021-08-30 | Discharge: 2021-08-30 | Disposition: A | Discharge status: Home / Self Care | Payer: Medicaid Other | Attending: Emergency Medicine | Admitting: Emergency Medicine

## 2021-08-30 DIAGNOSIS — Y9389 Activity, other specified: Secondary | ICD-10-CM | POA: Diagnosis not present

## 2021-08-30 DIAGNOSIS — S91342A Puncture wound with foreign body, left foot, initial encounter: Secondary | ICD-10-CM | POA: Diagnosis not present

## 2021-08-30 DIAGNOSIS — O26892 Other specified pregnancy related conditions, second trimester: Secondary | ICD-10-CM | POA: Diagnosis present

## 2021-08-30 DIAGNOSIS — Z3A00 Weeks of gestation of pregnancy not specified: Secondary | ICD-10-CM | POA: Insufficient documentation

## 2021-08-30 DIAGNOSIS — W458XXA Other foreign body or object entering through skin, initial encounter: Secondary | ICD-10-CM | POA: Insufficient documentation

## 2021-08-30 DIAGNOSIS — O9A212 Injury, poisoning and certain other consequences of external causes complicating pregnancy, second trimester: Secondary | ICD-10-CM | POA: Insufficient documentation

## 2021-08-30 DIAGNOSIS — S90851A Superficial foreign body, right foot, initial encounter: Secondary | ICD-10-CM

## 2021-08-30 MED ORDER — LIDOCAINE HCL (PF) 1 % IJ SOLN
5.0000 mL | Freq: Once | INTRAMUSCULAR | Status: AC
Start: 2021-08-30 — End: 2021-08-30
  Administered 2021-08-30: 5 mL
  Filled 2021-08-30: qty 5

## 2021-08-30 MED ORDER — CEPHALEXIN 500 MG PO CAPS: 500.0000 mg | ORAL_CAPSULE | Freq: Three times a day (TID) | ORAL | 0 refills | Status: DC

## 2021-08-30 NOTE — ED Provider Notes (Signed)
[facility] Provider Note    Event Date/Time   First MD Initiated Contact with Patient 08/30/21 1244     (approximate)   History   Foreign Body   HPI  Susan Cantrell is a 31 y.o. female   presents to the ED with complaint of a screw in the bottom of her right foot.  Patient was barefooted and was carrying a bookcase down some steps when she heard something fall out of it and immediately afterwards she felt something go into the bottom of her foot.  She is up-to-date on immunizations.  Patient currently is 5 months pregnant.      Physical Exam   Triage Vital Signs: ED Triage Vitals  Enc Vitals Group     BP 08/30/21 1157 114/88     Pulse Rate 08/30/21 1157 88     Resp 08/30/21 1157 (!) 25     Temp 08/30/21 1157 98.2 F (36.8 C)     Temp Source 08/30/21 1157 Oral     SpO2 08/30/21 1157 97 %     Weight 08/30/21 1212 (!) 332 lb 0.2 oz (150.6 kg)     Height 08/30/21 1212 5\' 8"  (1.727 m)     Head Circumference --      Peak Flow --      Pain Score 08/30/21 1204 10     Pain Loc --      Pain Edu? --      Excl. in [place]? --     Most recent vital signs: Vitals:   08/30/21 1157 08/30/21 1359  BP: 114/88 104/86  Pulse: 88 63  Resp: (!) 25 16  Temp: 98.2 F (36.8 C)   SpO2: 97% 97%     General: Awake, no distress.  CV:  Good peripheral perfusion.  Resp:  Normal effort.  Abd:  No distention.  Other:  Entire aspect of the right foot shows a minimal screw located in the skin of her foot.  No active bleeding is present.  Patient is able move digits distally to the foreign body.  Motor or sensory function intact.   ED Results / Procedures / Treatments   Labs (all labs ordered are listed, but only abnormal results are displayed) Labs Reviewed - No data to display    RADIOLOGY Right foot images were reviewed by myself independent of the radiologist and a medical foreign body is noted to be in the tissue.  No bony abnormality and not involving  any bones.    PROCEDURES:  Critical Care performed:   .Foreign Body Removal  Date/Time: 08/30/2021 1:11 PM  Performed by: 09/01/2021, PA-C Authorized by: Tommi Rumps, PA-C  Consent: Verbal consent obtained. Consent given by: patient Patient understanding: patient states understanding of the procedure being performed Body area: skin General location: lower extremity Location details: right foot Anesthesia: local infiltration  Anesthesia: Local Anesthetic: lidocaine 1% without epinephrine Anesthetic total: 3 mL  Sedation: Patient sedated: no  Patient restrained: no Removal mechanism: irrigation and hemostat Dressing: dressing applied Complexity: simple 1 objects recovered. Objects recovered: 1 metal screw Post-procedure assessment: foreign body removed     MEDICATIONS ORDERED IN ED: Medications  lidocaine (PF) (XYLOCAINE) 1 % injection 5 mL (5 mLs Infiltration Given by Other 08/30/21 1336)     IMPRESSION / MDM / ASSESSMENT AND PLAN / ED COURSE  I reviewed the triage vital signs and the nursing notes.   Differential diagnosis includes, but is not limited to, foreign  body right foot, puncture wound right foot, bony injury due to foreign body.  31 year old female presents to the ED with a metal screw in her foot.  Patient was barefooted when she stepped on the screw.  Metal screw is embedded in the skin plantar aspect of her right foot.  X-rays confirm this and no bony injury is noted.  Patient is up-to-date on immunizations.  Procedure went well and patient tolerated well.  She was soaked in Betadine and saline for approximately 20 minutes.  A prescription for cephalexin 500 mg 3 times daily for 7 days was sent to the pharmacy to prevent further infection.  She is also encouraged to watch this area for infection and follow-up with her PCP or return to the emergency department.  She may take Tylenol sparingly if needed for pain.  Patient was discharged  improved.      Patient's presentation is most consistent with acute complicated illness / injury requiring diagnostic workup.  FINAL CLINICAL IMPRESSION(S) / ED DIAGNOSES   Final diagnoses:  Foreign body in right foot, initial encounter     Rx / DC Orders   ED Discharge Orders          Ordered    cephALEXin (KEFLEX) 500 MG capsule  3 times daily        08/30/21 1333             Note:  This document was prepared using Dragon voice recognition software and may include unintentional dictation errors.   Tommi Rumps, PA-C 08/30/21 1433    Arnaldo Natal, MD 08/30/21 [phone]

## 2021-08-30 NOTE — ED Triage Notes (Signed)
Pt stepped on a screw about 30 minutes ago. Pt has screw in the bottom of her right foot.

## 2021-08-30 NOTE — Discharge Instructions (Addendum)
Continue to clean daily with mild soap and water and watch for any signs of infection.  A prescription for Keflex 5 mg 3 times daily was sent to the pharmacy to take for the next 7 days.  May see some bloody drainage for a day or 2 especially when you are up walking.  Elevate your foot anytime you have the sensation that there is some swelling which will help reduce the swelling and decrease pain.  You may take Tylenol sparingly while you are pregnant if needed for pain.

## 2021-09-17 ENCOUNTER — Other Ambulatory Visit: Payer: Self-pay

## 2021-09-17 DIAGNOSIS — O99212 Obesity complicating pregnancy, second trimester: Secondary | ICD-10-CM

## 2021-09-22 ENCOUNTER — Ambulatory Visit: Payer: Medicaid Other | Attending: Obstetrics and Gynecology

## 2021-09-22 ENCOUNTER — Other Ambulatory Visit: Payer: Self-pay

## 2021-09-22 DIAGNOSIS — O99332 Smoking (tobacco) complicating pregnancy, second trimester: Secondary | ICD-10-CM | POA: Diagnosis not present

## 2021-09-22 DIAGNOSIS — O321XX0 Maternal care for breech presentation, not applicable or unspecified: Secondary | ICD-10-CM | POA: Insufficient documentation

## 2021-09-22 DIAGNOSIS — O99212 Obesity complicating pregnancy, second trimester: Secondary | ICD-10-CM | POA: Insufficient documentation

## 2021-09-22 DIAGNOSIS — F17210 Nicotine dependence, cigarettes, uncomplicated: Secondary | ICD-10-CM | POA: Diagnosis not present

## 2021-09-22 DIAGNOSIS — Z363 Encounter for antenatal screening for malformations: Secondary | ICD-10-CM | POA: Insufficient documentation

## 2021-09-22 DIAGNOSIS — Z3A24 24 weeks gestation of pregnancy: Secondary | ICD-10-CM | POA: Insufficient documentation

## 2021-10-20 ENCOUNTER — Other Ambulatory Visit: Payer: Self-pay

## 2021-10-20 DIAGNOSIS — O99212 Obesity complicating pregnancy, second trimester: Secondary | ICD-10-CM

## 2021-10-21 ENCOUNTER — Encounter
Admission: RE | Admit: 2021-10-21 | Discharge: 2021-10-21 | Disposition: A | Discharge status: Home / Self Care | Payer: Medicaid Other | Source: Ambulatory Visit | Attending: Anesthesiology | Admitting: Anesthesiology

## 2021-10-21 NOTE — Consult Note (Signed)
[facility] Anesthesia Consultation  Susan Cantrell ASN:[id number] DOB: 06/30/1990 DOA: 10/21/2021 PCP: [facility]   Requesting physician: Ouida Sills, MD Date of consultation: 10/21/2021 Reason for consultation: Obesity during pregnancy  CHIEF COMPLAINT:  Obesity during pregnancy  HISTORY OF PRESENT ILLNESS: Susan Cantrell  is a 31 y.o. female with a known history of morbid obesity (BMI 50), no other known medical issus, presenting for preoperative evaluation for obesity for determination of appropriateness of delivery in a community hospital setting. Patient is a light smoker, 3-4 cigarettes per day. She has delivered twice here before, with no issues recalled by her. No major health changes since her most recent delivery two years ago. She has had epidurals both times before without issues.  PAST MEDICAL HISTORY:   Past Medical History:  Diagnosis Date   Closed impacted fracture of left hip with routine healing 2014   no sx required   Fracture of coccyx, initial encounter for closed fracture ([place]) 2014   no sx required   Pregnancy    [facility] 09/29/2015    PAST SURGICAL HISTORY:  Past Surgical History:  Procedure Laterality Date   DILATION AND EVACUATION N/A 03/05/2020   Procedure: DILATATION AND EVACUATION;  Surgeon: Boykin Nearing, MD;  Location: [facility] ORS;  Service: Gynecology;  Laterality: N/A;   TYMPANOSTOMY TUBE PLACEMENT      SOCIAL HISTORY:  Social History   Tobacco Use   Smoking status: Every Day    Packs/day: 0.25    Years: 0.50    Total pack years: 0.13    Types: Cigarettes    Last attempt to quit: 12/12/2014    Years since quitting: 6.8   Smokeless tobacco: Never  Substance Use Topics   Alcohol use: Not Currently    Alcohol/week: 6.0 standard drinks of alcohol    Types: 6 Shots of liquor per week    FAMILY HISTORY:  Family History  Problem Relation Age of Onset   Asthma Brother    Cancer Maternal  Grandmother    Diabetes Maternal Grandmother     DRUG ALLERGIES:  Allergies  Allergen Reactions   Amoxicillin    Doxycycline    Oxycodone Swelling    Throat swelling    REVIEW OF SYSTEMS:   RESPIRATORY: No cough, shortness of breath, wheezing.  CARDIOVASCULAR: No chest pain, orthopnea, edema.  HEMATOLOGY: No anemia, easy bruising or bleeding SKIN: No rash or lesion. NEUROLOGIC: No tingling, numbness, weakness.  PSYCHIATRY: No anxiety or depression.   MEDICATIONS AT HOME:  Prior to Admission medications   Medication Sig Start Date End Date Taking? Authorizing Provider  cephALEXin (KEFLEX) 500 MG capsule Take 1 capsule (500 mg total) by mouth 3 (three) times daily. Patient not taking: Reported on 09/22/2021 08/30/21   Letitia Neri L, PA-C  coconut oil OIL Apply 1 application topically as needed. Patient not taking: Reported on 08/11/2021 03/24/19   Minda Meo, CNM  loratadine (CLARITIN) 10 MG tablet Take 10 mg by mouth daily. Patient not taking: Reported on 08/11/2021 05/29/21   [provider]      PHYSICAL EXAMINATION:   VITAL SIGNS: Last menstrual period 12/08/2020.  GENERAL:  31 y.o.-year-old obese patient no acute distress.  HEENT: Head atraumatic, normocephalic. Oropharynx and nasopharynx clear. MP 1, TM distance >3 cm, normal mouth opening. LUNGS: No use of accessory muscles of respiration.   EXTREMITIES: No pedal edema, cyanosis, or clubbing.  NEUROLOGIC: normal gait PSYCHIATRIC: The patient is alert and oriented x 3.  SKIN:  No obvious rash, lesion, or ulcer.    IMPRESSION AND PLAN:   Susan Cantrell  is a 31 y.o. female presenting with obesity during pregnancy. BMI is currently 50 at [redacted]w[redacted]d gestation.   We discussed analgesic options during labor including epidural analgesia. Discussed that in obesity there can be increased difficulty with epidural placement or even failure of successful epidural. We also discussed that even after successful epidural  placement there is increased risk of catheter migration out of the epidural space that would require catheter replacement. Discussed use of epidural vs spinal vs GA if cesarean delivery is required. Discussed increased risk of difficult intubation during pregnancy should an emergency cesarean delivery be required.   We discussed repeat evaluation at 35-36 weeks by anesthesia to determine whether there is a high risk of complications of anesthesia for which we would recommend transfer of OB care to a facility with a higher maternal level of care designation.    Appropriate for anesthetic care and delivery at [facility] currently.

## 2021-10-22 ENCOUNTER — Ambulatory Visit: Payer: Medicaid Other | Attending: Obstetrics

## 2021-10-22 ENCOUNTER — Other Ambulatory Visit: Payer: Self-pay

## 2021-10-22 DIAGNOSIS — O99333 Smoking (tobacco) complicating pregnancy, third trimester: Secondary | ICD-10-CM

## 2021-10-22 DIAGNOSIS — O99213 Obesity complicating pregnancy, third trimester: Secondary | ICD-10-CM | POA: Insufficient documentation

## 2021-10-22 DIAGNOSIS — O99212 Obesity complicating pregnancy, second trimester: Secondary | ICD-10-CM

## 2021-10-22 DIAGNOSIS — F17210 Nicotine dependence, cigarettes, uncomplicated: Secondary | ICD-10-CM | POA: Diagnosis not present

## 2021-10-22 DIAGNOSIS — Z3A28 28 weeks gestation of pregnancy: Secondary | ICD-10-CM | POA: Insufficient documentation

## 2021-11-12 ENCOUNTER — Inpatient Hospital Stay: Admission: RE | Admit: 2021-11-12 | Payer: BLUE CROSS/BLUE SHIELD | Source: Ambulatory Visit

## 2021-11-24 ENCOUNTER — Other Ambulatory Visit: Payer: Self-pay

## 2021-11-24 DIAGNOSIS — O99213 Obesity complicating pregnancy, third trimester: Secondary | ICD-10-CM

## 2021-11-26 ENCOUNTER — Other Ambulatory Visit: Payer: Self-pay

## 2021-11-26 ENCOUNTER — Ambulatory Visit
Admission: RE | Admit: 2021-11-26 | Discharge: 2021-11-26 | Disposition: A | Discharge status: Home / Self Care | Payer: Medicaid Other | Source: Ambulatory Visit | Attending: Family Medicine | Admitting: Family Medicine

## 2021-11-26 ENCOUNTER — Ambulatory Visit (HOSPITAL_BASED_OUTPATIENT_CLINIC_OR_DEPARTMENT_OTHER): Payer: Medicaid Other

## 2021-11-26 ENCOUNTER — Other Ambulatory Visit: Payer: Self-pay | Admitting: Family Medicine

## 2021-11-26 DIAGNOSIS — O99333 Smoking (tobacco) complicating pregnancy, third trimester: Secondary | ICD-10-CM

## 2021-11-26 DIAGNOSIS — M7989 Other specified soft tissue disorders: Secondary | ICD-10-CM | POA: Insufficient documentation

## 2021-11-26 DIAGNOSIS — O99213 Obesity complicating pregnancy, third trimester: Secondary | ICD-10-CM

## 2021-11-26 DIAGNOSIS — M79662 Pain in left lower leg: Secondary | ICD-10-CM

## 2021-11-26 DIAGNOSIS — Z3A33 33 weeks gestation of pregnancy: Secondary | ICD-10-CM

## 2021-11-26 DIAGNOSIS — E669 Obesity, unspecified: Secondary | ICD-10-CM | POA: Diagnosis not present

## 2021-11-26 DIAGNOSIS — F17210 Nicotine dependence, cigarettes, uncomplicated: Secondary | ICD-10-CM | POA: Diagnosis not present

## 2021-11-27 ENCOUNTER — Other Ambulatory Visit: Payer: Self-pay | Admitting: Family Medicine

## 2021-11-27 DIAGNOSIS — M79662 Pain in left lower leg: Secondary | ICD-10-CM

## 2021-12-01 ENCOUNTER — Other Ambulatory Visit: Payer: Self-pay

## 2021-12-01 ENCOUNTER — Ambulatory Visit: Payer: Medicaid Other | Attending: Maternal & Fetal Medicine

## 2021-12-01 DIAGNOSIS — O99333 Smoking (tobacco) complicating pregnancy, third trimester: Secondary | ICD-10-CM

## 2021-12-01 DIAGNOSIS — E669 Obesity, unspecified: Secondary | ICD-10-CM | POA: Diagnosis not present

## 2021-12-01 DIAGNOSIS — Z3A34 34 weeks gestation of pregnancy: Secondary | ICD-10-CM | POA: Diagnosis not present

## 2021-12-01 DIAGNOSIS — O99213 Obesity complicating pregnancy, third trimester: Secondary | ICD-10-CM | POA: Diagnosis not present

## 2021-12-01 DIAGNOSIS — F17210 Nicotine dependence, cigarettes, uncomplicated: Secondary | ICD-10-CM | POA: Diagnosis not present

## 2021-12-07 ENCOUNTER — Other Ambulatory Visit: Payer: Self-pay

## 2021-12-07 DIAGNOSIS — O99213 Obesity complicating pregnancy, third trimester: Secondary | ICD-10-CM

## 2021-12-08 ENCOUNTER — Ambulatory Visit: Payer: Medicaid Other | Attending: Obstetrics and Gynecology

## 2021-12-08 ENCOUNTER — Other Ambulatory Visit: Payer: Self-pay

## 2021-12-08 DIAGNOSIS — O99333 Smoking (tobacco) complicating pregnancy, third trimester: Secondary | ICD-10-CM

## 2021-12-08 DIAGNOSIS — Z3A35 35 weeks gestation of pregnancy: Secondary | ICD-10-CM | POA: Diagnosis present

## 2021-12-08 DIAGNOSIS — F17210 Nicotine dependence, cigarettes, uncomplicated: Secondary | ICD-10-CM

## 2021-12-08 DIAGNOSIS — O24113 Pre-existing diabetes mellitus, type 2, in pregnancy, third trimester: Secondary | ICD-10-CM | POA: Insufficient documentation

## 2021-12-08 DIAGNOSIS — O99213 Obesity complicating pregnancy, third trimester: Secondary | ICD-10-CM | POA: Diagnosis not present

## 2021-12-08 DIAGNOSIS — E669 Obesity, unspecified: Secondary | ICD-10-CM

## 2021-12-10 ENCOUNTER — Observation Stay
Admission: EM | Admit: 2021-12-10 | Discharge: 2021-12-10 | Disposition: A | Discharge status: Home / Self Care | Payer: Medicaid Other | Attending: Obstetrics and Gynecology | Admitting: Obstetrics and Gynecology

## 2021-12-10 DIAGNOSIS — F17210 Nicotine dependence, cigarettes, uncomplicated: Secondary | ICD-10-CM | POA: Insufficient documentation

## 2021-12-10 DIAGNOSIS — O0993 Supervision of high risk pregnancy, unspecified, third trimester: Principal | ICD-10-CM | POA: Insufficient documentation

## 2021-12-10 DIAGNOSIS — O26893 Other specified pregnancy related conditions, third trimester: Secondary | ICD-10-CM | POA: Diagnosis present

## 2021-12-10 DIAGNOSIS — R109 Unspecified abdominal pain: Secondary | ICD-10-CM | POA: Diagnosis not present

## 2021-12-10 DIAGNOSIS — Z3A35 35 weeks gestation of pregnancy: Secondary | ICD-10-CM | POA: Insufficient documentation

## 2021-12-10 DIAGNOSIS — O212 Late vomiting of pregnancy: Secondary | ICD-10-CM | POA: Insufficient documentation

## 2021-12-10 DIAGNOSIS — O99333 Smoking (tobacco) complicating pregnancy, third trimester: Secondary | ICD-10-CM | POA: Diagnosis not present

## 2021-12-10 HISTORY — DX: Unspecified abdominal pain: R10.9

## 2021-12-10 LAB — URINALYSIS, COMPLETE (UACMP) WITH MICROSCOPIC
Bilirubin Urine: NEGATIVE
Glucose, UA: NEGATIVE mg/dL
Ketones, ur: NEGATIVE mg/dL
Nitrite: NEGATIVE
Protein, ur: NEGATIVE mg/dL
Specific Gravity, Urine: 1.008 (ref 1.005–1.030)
pH: 6 (ref 5.0–8.0)

## 2021-12-10 LAB — WET PREP, GENITAL
Clue Cells Wet Prep HPF POC: NONE SEEN
Sperm: NONE SEEN
Trich, Wet Prep: NONE SEEN
WBC, Wet Prep HPF POC: <10 (ref <10)
Yeast Wet Prep HPF POC: NONE SEEN

## 2021-12-10 LAB — CHLAMYDIA/NGC RT PCR (ARMC ONLY)
Chlamydia Tr: NOT DETECTED
N gonorrhoeae: NOT DETECTED

## 2021-12-10 MED ORDER — FAMOTIDINE 20 MG PO TABS
ORAL_TABLET | ORAL | Status: AC
  Filled 2021-12-10: qty 1

## 2021-12-10 MED ORDER — FAMOTIDINE 20 MG PO TABS
20.0000 mg | ORAL_TABLET | Freq: Every day | ORAL | Status: DC
  Administered 2021-12-10: 20 mg via ORAL

## 2021-12-10 MED ORDER — FAMOTIDINE 20 MG PO TABS: 20.0000 mg | ORAL_TABLET | Freq: Two times a day (BID) | ORAL | 0 refills | Status: DC

## 2021-12-10 NOTE — Discharge Summary (Signed)
Susan Cantrell is a 30 y.o. female. She is at [redacted]w[redacted]d gestation. Patient's last menstrual period was 12/08/2020 (exact date). 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 Reports active FM. Has occasional vomiting. Pain was initially 10/10 but has eased to a dull ache now, right side, just under rib cage. NO nausea or vomiting at this time.    S: Resting comfortably. no CTX, no VB.no LOF,  Active fetal movement. Denies: HA, visual changes, SOB, or RUQ/epigastric pain  Maternal Medical History:   Past Medical History:  Diagnosis Date   Closed impacted fracture of left hip with routine healing 2014   no sx required   Fracture of coccyx, initial encounter for closed fracture ([facility]) 2014   no sx required   Pregnancy    [facility] 09/29/2015    Past Surgical History:  Procedure Laterality Date   DILATION AND EVACUATION N/A 03/05/2020   Procedure: DILATATION AND EVACUATION;  Surgeon: Suzy Bouchard, MD;  Location: [facility] ORS;  Service: Gynecology;  Laterality: N/A;   TYMPANOSTOMY TUBE PLACEMENT      Allergies  Allergen Reactions   Amoxicillin    Doxycycline    Oxycodone Swelling    Throat swelling    Prior to Admission medications   Medication Sig Start Date End Date Taking? Authorizing Provider  famotidine (PEPCID) 20 MG tablet Take 1 tablet (20 mg total) by mouth 2 (two) times daily. 12/10/21   Caydn Justen, Prudencio Pair, CNM      Social History: She  reports that she has been smoking cigarettes. She has a 0.13 pack-year smoking history. She has never used smokeless tobacco. She reports that she does not  currently use alcohol after a past usage of about 6.0 standard drinks of alcohol per week. She reports current drug use. Drug: Marijuana.  Family History: family history includes Asthma in her brother; Cancer in her maternal grandmother; Diabetes in her maternal grandmother.   Review of Systems: A full review of systems was performed and negative except as noted in the HPI.     O:  Temp 98 F (36.7 C) (Oral)   Resp 16   LMP 12/08/2020 (Exact Date)  Results for orders placed or performed during the hospital encounter of 12/10/21 (from the past 48 hour(s))  Wet prep, genital   Collection Time: 12/10/21  5:03 PM   Specimen: Vaginal  Result Value Ref Range   Yeast Wet Prep HPF POC NONE SEEN NONE SEEN   Trich, Wet Prep NONE SEEN NONE SEEN   Clue Cells Wet Prep HPF POC NONE SEEN NONE SEEN   WBC, Wet Prep HPF POC <10 <10   Sperm NONE SEEN   Urinalysis, Complete w Microscopic Urine, Clean Catch   Collection Time: 12/10/21  5:03 PM  Result Value Ref Range   Color, Urine YELLOW (A) YELLOW   APPearance HAZY (A) CLEAR   Specific Gravity, Urine 1.008 1.005 - 1.030   pH 6.0 5.0 - 8.0   Glucose, UA NEGATIVE NEGATIVE mg/dL   Hgb urine dipstick SMALL (A) NEGATIVE   Bilirubin Urine NEGATIVE NEGATIVE   Ketones, ur NEGATIVE NEGATIVE mg/dL  Protein, ur NEGATIVE NEGATIVE mg/dL   Nitrite NEGATIVE NEGATIVE   Leukocytes,Ua SMALL (A) NEGATIVE   RBC / HPF 0-5 0 - 5 RBC/hpf   WBC, UA 6-10 0 - 5 WBC/hpf   Bacteria, UA RARE (A) NONE SEEN   Squamous Epithelial / LPF 6-10 0 - 5   Mucus PRESENT      Constitutional: NAD, AAOx3  HE/ENT: extraocular movements grossly intact, moist mucous membranes CV: RRR PULM: nl respiratory effort, CTABL     Abd: gravid, non-tender, non-distended, soft      Ext: Non-tender, Nonedematous   Psych: mood appropriate, speech normal Pelvic: deferred   Fetal  monitoring: Cat I Appropriate for GA Baseline: 145bpm Variability: moderate Accelerations: present x  >2 Decelerations absent Time    A/P: 31 y.o. [redacted]w[redacted]d here for antenatal surveillance for right side pain  Principle Diagnosis:  High risk pregnancy in third trimester  Labor: not present.  Fetal Wellbeing: Reassuring Cat 1 tracing, Reactive NST  UA, wet prep negative Plan to have CMP added on to her 36wk labs, consider RUQ [place] in office if pain reoccurs.  D/c home stable, precautions reviewed, follow-up as scheduled.    Randa Ngo, CNM 12/10/2021  6:13 PM

## 2021-12-10 NOTE — OB Triage Note (Addendum)
Presents with complaint of pain in the R  flank area that started this morning and comes and goes. Also complaining of cramping on and off. Denies bleeding or any urinary symptoms.Marland Kitchen EFMS applied.

## 2021-12-15 ENCOUNTER — Other Ambulatory Visit: Payer: Self-pay

## 2021-12-15 DIAGNOSIS — O99213 Obesity complicating pregnancy, third trimester: Secondary | ICD-10-CM

## 2021-12-17 ENCOUNTER — Other Ambulatory Visit: Payer: Self-pay

## 2021-12-17 ENCOUNTER — Ambulatory Visit: Payer: Medicaid Other | Attending: Obstetrics

## 2021-12-17 DIAGNOSIS — E669 Obesity, unspecified: Secondary | ICD-10-CM

## 2021-12-17 DIAGNOSIS — F17200 Nicotine dependence, unspecified, uncomplicated: Secondary | ICD-10-CM | POA: Diagnosis not present

## 2021-12-17 DIAGNOSIS — Z3A36 36 weeks gestation of pregnancy: Secondary | ICD-10-CM | POA: Diagnosis not present

## 2021-12-17 DIAGNOSIS — O99213 Obesity complicating pregnancy, third trimester: Secondary | ICD-10-CM | POA: Diagnosis not present

## 2021-12-17 DIAGNOSIS — O99333 Smoking (tobacco) complicating pregnancy, third trimester: Secondary | ICD-10-CM | POA: Insufficient documentation

## 2021-12-17 DIAGNOSIS — F17210 Nicotine dependence, cigarettes, uncomplicated: Secondary | ICD-10-CM

## 2021-12-17 LAB — OB RESULTS CONSOLE RPR: RPR: NONREACTIVE

## 2021-12-17 LAB — OB RESULTS CONSOLE HIV ANTIBODY (ROUTINE TESTING): HIV: NONREACTIVE

## 2021-12-17 LAB — OB RESULTS CONSOLE GBS: GBS: POSITIVE

## 2021-12-22 ENCOUNTER — Other Ambulatory Visit: Payer: Self-pay

## 2021-12-22 DIAGNOSIS — O99213 Obesity complicating pregnancy, third trimester: Secondary | ICD-10-CM

## 2021-12-24 ENCOUNTER — Ambulatory Visit: Payer: Medicaid Other | Attending: Obstetrics

## 2021-12-24 ENCOUNTER — Other Ambulatory Visit: Payer: Self-pay

## 2021-12-24 ENCOUNTER — Other Ambulatory Visit: Payer: Self-pay | Admitting: Obstetrics

## 2021-12-24 DIAGNOSIS — E669 Obesity, unspecified: Secondary | ICD-10-CM | POA: Diagnosis not present

## 2021-12-24 DIAGNOSIS — O99333 Smoking (tobacco) complicating pregnancy, third trimester: Secondary | ICD-10-CM | POA: Diagnosis not present

## 2021-12-24 DIAGNOSIS — O99213 Obesity complicating pregnancy, third trimester: Secondary | ICD-10-CM

## 2021-12-24 DIAGNOSIS — F17210 Nicotine dependence, cigarettes, uncomplicated: Secondary | ICD-10-CM | POA: Diagnosis not present

## 2021-12-24 DIAGNOSIS — Z3A37 37 weeks gestation of pregnancy: Secondary | ICD-10-CM | POA: Diagnosis not present

## 2021-12-28 ENCOUNTER — Inpatient Hospital Stay: Payer: Medicaid Other | Admitting: Anesthesiology

## 2021-12-28 ENCOUNTER — Encounter: Payer: Self-pay | Admitting: Obstetrics and Gynecology

## 2021-12-28 ENCOUNTER — Inpatient Hospital Stay
Admission: EM | Admit: 2021-12-28 | Discharge: 2021-12-30 | DRG: 806 | Disposition: A | Discharge status: Home / Self Care | Payer: Medicaid Other | Attending: Obstetrics and Gynecology | Admitting: Obstetrics and Gynecology

## 2021-12-28 ENCOUNTER — Other Ambulatory Visit: Payer: Self-pay

## 2021-12-28 DIAGNOSIS — O99824 Streptococcus B carrier state complicating childbirth: Secondary | ICD-10-CM | POA: Diagnosis present

## 2021-12-28 DIAGNOSIS — Z3A38 38 weeks gestation of pregnancy: Secondary | ICD-10-CM

## 2021-12-28 DIAGNOSIS — D509 Iron deficiency anemia, unspecified: Secondary | ICD-10-CM | POA: Diagnosis present

## 2021-12-28 DIAGNOSIS — O99214 Obesity complicating childbirth: Secondary | ICD-10-CM | POA: Diagnosis present

## 2021-12-28 DIAGNOSIS — O9832 Other infections with a predominantly sexual mode of transmission complicating childbirth: Secondary | ICD-10-CM | POA: Diagnosis present

## 2021-12-28 DIAGNOSIS — O99334 Smoking (tobacco) complicating childbirth: Secondary | ICD-10-CM | POA: Diagnosis present

## 2021-12-28 DIAGNOSIS — R030 Elevated blood-pressure reading, without diagnosis of hypertension: Secondary | ICD-10-CM | POA: Diagnosis present

## 2021-12-28 DIAGNOSIS — O133 Gestational [pregnancy-induced] hypertension without significant proteinuria, third trimester: Secondary | ICD-10-CM

## 2021-12-28 DIAGNOSIS — O134 Gestational [pregnancy-induced] hypertension without significant proteinuria, complicating childbirth: Principal | ICD-10-CM | POA: Diagnosis present

## 2021-12-28 DIAGNOSIS — F17210 Nicotine dependence, cigarettes, uncomplicated: Secondary | ICD-10-CM | POA: Diagnosis present

## 2021-12-28 DIAGNOSIS — A6000 Herpesviral infection of urogenital system, unspecified: Secondary | ICD-10-CM | POA: Diagnosis present

## 2021-12-28 DIAGNOSIS — O9902 Anemia complicating childbirth: Secondary | ICD-10-CM | POA: Diagnosis present

## 2021-12-28 HISTORY — DX: Gestational (pregnancy-induced) hypertension without significant proteinuria, third trimester: O13.3

## 2021-12-28 LAB — CBC
HCT: 33 % — ABNORMAL LOW (ref 36.0–46.0)
Hemoglobin: 11 g/dL — ABNORMAL LOW (ref 12.0–15.0)
MCH: 28.9 pg (ref 26.0–34.0)
MCHC: 33.3 g/dL (ref 30.0–36.0)
MCV: 86.6 fL (ref 80.0–100.0)
Platelets: 319 10*3/uL (ref 150–400)
RBC: 3.81 MIL/uL — ABNORMAL LOW (ref 3.87–5.11)
RDW: 13.4 % (ref 11.5–15.5)
WBC: 7.7 10*3/uL (ref 4.0–10.5)
nRBC: 0 % (ref 0.0–0.2)

## 2021-12-28 LAB — PROTEIN / CREATININE RATIO, URINE
Creatinine, Urine: 276 mg/dL
Protein Creatinine Ratio: 0.08 mg/mg{Cre} (ref 0.00–0.15)
Total Protein, Urine: 22 mg/dL

## 2021-12-28 LAB — COMPREHENSIVE METABOLIC PANEL
ALT: 26 U/L (ref 0–44)
AST: 33 U/L (ref 15–41)
Albumin: 2.7 g/dL — ABNORMAL LOW (ref 3.5–5.0)
Alkaline Phosphatase: 167 U/L — ABNORMAL HIGH (ref 38–126)
Anion gap: 5 (ref 5–15)
BUN: <5 mg/dL — ABNORMAL LOW (ref 6–20)
CO2: 22 mmol/L (ref 22–32)
Calcium: 8.8 mg/dL — ABNORMAL LOW (ref 8.9–10.3)
Chloride: 110 mmol/L (ref 98–111)
Creatinine, Ser: 0.66 mg/dL (ref 0.44–1.00)
GFR, Estimated: >60 mL/min (ref >60)
Glucose, Bld: 92 mg/dL (ref 70–99)
Potassium: 4.1 mmol/L (ref 3.5–5.1)
Sodium: 137 mmol/L (ref 135–145)
Total Bilirubin: 0.4 mg/dL (ref 0.3–1.2)
Total Protein: 6.4 g/dL — ABNORMAL LOW (ref 6.5–8.1)

## 2021-12-28 LAB — URINE DRUG SCREEN, QUALITATIVE (ARMC ONLY)
Amphetamines, Ur Screen: NOT DETECTED
Barbiturates, Ur Screen: NOT DETECTED
Benzodiazepine, Ur Scrn: NOT DETECTED
Cannabinoid 50 Ng, Ur ~~LOC~~: NOT DETECTED
Cocaine Metabolite,Ur ~~LOC~~: NOT DETECTED
MDMA (Ecstasy)Ur Screen: NOT DETECTED
Methadone Scn, Ur: NOT DETECTED
Opiate, Ur Screen: NOT DETECTED
Phencyclidine (PCP) Ur S: NOT DETECTED
Tricyclic, Ur Screen: NOT DETECTED

## 2021-12-28 LAB — TYPE AND SCREEN
ABO/RH(D): A POS
Antibody Screen: NEGATIVE

## 2021-12-28 MED ORDER — OXYTOCIN-SODIUM CHLORIDE 30-0.9 UT/500ML-% IV SOLN
INTRAVENOUS | Status: AC
  Filled 2021-12-28: qty 500

## 2021-12-28 MED ORDER — FENTANYL-BUPIVACAINE-NACL 0.5-0.125-0.9 MG/250ML-% EP SOLN
EPIDURAL | Status: AC
  Filled 2021-12-28: qty 250

## 2021-12-28 MED ORDER — LACTATED RINGERS IV SOLN: INTRAVENOUS | Status: DC

## 2021-12-28 MED ORDER — AMMONIA AROMATIC IN INHA
RESPIRATORY_TRACT | Status: AC
  Filled 2021-12-28: qty 10

## 2021-12-28 MED ORDER — TERBUTALINE SULFATE 1 MG/ML IJ SOLN: 0.2500 mg | Freq: Once | INTRAMUSCULAR | Status: DC | PRN

## 2021-12-28 MED ORDER — ACETAMINOPHEN 325 MG PO TABS
650.0000 mg | ORAL_TABLET | ORAL | Status: DC | PRN
  Administered 2021-12-29: 650 mg via ORAL
  Filled 2021-12-28: qty 2

## 2021-12-28 MED ORDER — CEFAZOLIN SODIUM-DEXTROSE 1-4 GM/50ML-% IV SOLN
1.0000 g | Freq: Three times a day (TID) | INTRAVENOUS | Status: DC
  Administered 2021-12-29: 1 g via INTRAVENOUS
  Filled 2021-12-28 (×2): qty 50

## 2021-12-28 MED ORDER — OXYTOCIN-SODIUM CHLORIDE 30-0.9 UT/500ML-% IV SOLN: 2.5000 [IU]/h | INTRAVENOUS | Status: DC

## 2021-12-28 MED ORDER — LIDOCAINE HCL (PF) 1 % IJ SOLN
INTRAMUSCULAR | Status: AC
  Filled 2021-12-28: qty 30

## 2021-12-28 MED ORDER — CEFAZOLIN SODIUM-DEXTROSE 2-4 GM/100ML-% IV SOLN
2.0000 g | Freq: Once | INTRAVENOUS | Status: AC
  Administered 2021-12-28: 2 g via INTRAVENOUS
  Filled 2021-12-28: qty 100

## 2021-12-28 MED ORDER — DIPHENHYDRAMINE HCL 50 MG/ML IJ SOLN: 12.5000 mg | INTRAMUSCULAR | Status: DC | PRN

## 2021-12-28 MED ORDER — PHENYLEPHRINE 80 MCG/ML (10ML) SYRINGE FOR IV PUSH (FOR BLOOD PRESSURE SUPPORT): 80.0000 ug | PREFILLED_SYRINGE | INTRAVENOUS | Status: DC | PRN

## 2021-12-28 MED ORDER — EPHEDRINE 5 MG/ML INJ: 10.0000 mg | INTRAVENOUS | Status: DC | PRN

## 2021-12-28 MED ORDER — LIDOCAINE HCL (PF) 1 % IJ SOLN: 30.0000 mL | INTRAMUSCULAR | Status: DC | PRN

## 2021-12-28 MED ORDER — LACTATED RINGERS IV SOLN: 500.0000 mL | INTRAVENOUS | Status: DC | PRN

## 2021-12-28 MED ORDER — OXYTOCIN 10 UNIT/ML IJ SOLN
INTRAMUSCULAR | Status: AC
  Filled 2021-12-28: qty 2

## 2021-12-28 MED ORDER — FENTANYL-BUPIVACAINE-NACL 0.5-0.125-0.9 MG/250ML-% EP SOLN: 12.0000 mL/h | EPIDURAL | Status: DC | PRN

## 2021-12-28 MED ORDER — LIDOCAINE HCL (PF) 1 % IJ SOLN
INTRAMUSCULAR | Status: DC | PRN
  Administered 2021-12-28: 1 mL via SUBCUTANEOUS

## 2021-12-28 MED ORDER — LIDOCAINE-EPINEPHRINE (PF) 1.5 %-1:200000 IJ SOLN
INTRAMUSCULAR | Status: DC | PRN
  Administered 2021-12-28: 3 mL via EPIDURAL

## 2021-12-28 MED ORDER — OXYTOCIN-SODIUM CHLORIDE 30-0.9 UT/500ML-% IV SOLN
1.0000 m[IU]/min | INTRAVENOUS | Status: DC
  Administered 2021-12-29: 2 m[IU]/min via INTRAVENOUS

## 2021-12-28 MED ORDER — MISOPROSTOL 200 MCG PO TABS
ORAL_TABLET | ORAL | Status: AC
  Filled 2021-12-28: qty 4

## 2021-12-28 MED ORDER — FENTANYL-BUPIVACAINE-NACL 0.5-0.125-0.9 MG/250ML-% EP SOLN
EPIDURAL | Status: DC | PRN
  Administered 2021-12-28: 12 mL/h via EPIDURAL

## 2021-12-28 MED ORDER — FENTANYL CITRATE (PF) 100 MCG/2ML IJ SOLN: 50.0000 ug | INTRAMUSCULAR | Status: DC | PRN

## 2021-12-28 MED ORDER — SOD CITRATE-CITRIC ACID 500-334 MG/5ML PO SOLN: 30.0000 mL | ORAL | Status: DC | PRN

## 2021-12-28 MED ORDER — OXYTOCIN BOLUS FROM INFUSION
333.0000 mL | Freq: Once | INTRAVENOUS | Status: AC
  Administered 2021-12-29: 333 mL via INTRAVENOUS

## 2021-12-28 MED ORDER — LACTATED RINGERS IV SOLN: 500.0000 mL | Freq: Once | INTRAVENOUS | Status: DC

## 2021-12-28 MED ORDER — ONDANSETRON HCL 4 MG/2ML IJ SOLN: 4.0000 mg | Freq: Four times a day (QID) | INTRAMUSCULAR | Status: DC | PRN

## 2021-12-28 NOTE — Progress Notes (Signed)
Labor Progress Note  Susan Cantrell is a 31 y.o. [id number] at [redacted]w[redacted]d by LMP admitted for induction of labor due to [facility].  Subjective: feeling cramps, pelvic pressure. Still has slight intermittent HA.   Objective: BP 128/88   Pulse 70   Temp 98.2 F (36.8 C) (Oral)   Resp 18   Ht 5\' 8"  (1.727 m)   Wt (!) 144.2 kg   LMP 12/08/2020 (Exact Date)   SpO2 100%   BMI 48.35 kg/m   Notable VS details:  Vitals:   12/28/21 1457 12/28/21 1508 12/28/21 1523 12/28/21 1538  BP: 118/63 115/67 (!) 97/45 114/79   12/28/21 1553 12/28/21 1608 12/28/21 1623 12/28/21 1638  BP: 118/71 128/78 113/60 105/62   12/28/21 1653 12/28/21 1708 12/28/21 1723 12/28/21 1958  BP: (!) 118/95 109/64 (!) 115/54 128/88     Fetal Assessment: FHT:  FHR: 130 bpm, variability: moderate,  accelerations:  Present,  decelerations:  Absent Category/reactivity:  Category I UC:   regular, every 1.5-4 minutes SVE:   4/50/-1; soft/midposition - AROM, mod amount clear fluid.  Membrane status: AROM at 1946 Amniotic color: clear  Labs: Lab Results  Component Value Date   WBC 7.7 12/28/2021   HGB 11.0 (L) 12/28/2021   HCT 33.0 (L) 12/28/2021   MCV 86.6 12/28/2021   PLT 319 12/28/2021    Assessment / Plan: 12/30/2021 at [redacted]w[redacted]d  GHTN- has had 2 mild range BP  Labor: progression since last exam in office, AROM now.  Preeclampsia:  normal labs Fetal Wellbeing:  Category I Pain Control:  Labor support without medications; desires epidural I/D:  GBS Pos- Ancef 2gm IVPB started Anticipated MOD:  NSVD  [redacted]w[redacted]d Pegge Cumberledge, CNM 12/28/2021, 8:05 PM

## 2021-12-28 NOTE — H&P (Signed)
OB History & Physical   History of Present Illness:  Chief Complaint: elevated BP in office.   HPI:  CAMRIE STOCK is a 31 y.o. [phone] female at [redacted]w[redacted]d dated by LMP and c/w [place] at [redacted]w[redacted]d.  She presents to L&D due to elevated BP in office, has had ongoing LE swelling with intermittent HA off and on for several weeks. Elevated BP 151/88 today in office, with mostly normotensive BP in triage and occasional mild range. Having regular uncomfortable contractions, some bloody show since exam in office.    Pregnancy Issues: 1. Obesity, pre-preg BMI 50.8 2. 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 GBS Pos- Clinda resistant 4. MJ and tobacco use: has quit both.  5. Depression: previously on Zoloft 6. Hives during pregnancy, using topical hydrocortisone, seen by Derm.  7. Iron deficiency anemia, PO supplementation.    Maternal Medical History:   Past Medical History:  Diagnosis Date   Closed impacted fracture of left hip with routine healing 2014   no sx required   Fracture of coccyx, initial encounter for closed fracture (HCC) 2014   no sx required   Pregnancy    [facility] 09/29/2015    Past Surgical History:  Procedure Laterality Date   DILATION AND EVACUATION N/A 03/05/2020   Procedure: DILATATION AND EVACUATION;  Surgeon: Suzy Bouchard, MD;  Location: [facility] ORS;  Service: Gynecology;  Laterality: N/A;   TYMPANOSTOMY TUBE PLACEMENT      Allergies  Allergen Reactions   Amoxicillin    Doxycycline    Oxycodone Swelling    Throat swelling    Prior to Admission medications   Medication Sig Start Date End Date Taking? Authorizing Provider  famotidine (PEPCID) 20 MG tablet Take 1 tablet (20 mg total) by mouth 2 (two) times daily. 12/10/21  Yes Pacer Dorn, Prudencio Pair, CNM     Prenatal care site: [facility]   Social History: She  reports that she has been smoking cigarettes. She has a 0.13 pack-year smoking history. She has never used  smokeless tobacco. She reports that she does not currently use alcohol after a past usage of about 6.0 standard drinks of alcohol per week. She reports current drug use. Drug: Marijuana.  Family History: family history includes Asthma in her brother; Cancer in her maternal grandmother; Diabetes in her maternal grandmother.   Review of Systems: A full review of systems was performed and negative except as noted in the HPI.     Physical Exam:  Vital Signs: BP 105/62   Pulse 66   Temp 98.6 F (37 C) (Oral)   Resp 18   Ht 5\' 8"  (1.727 m)   Wt (!) 144.2 kg   LMP 12/08/2020 (Exact Date)   SpO2 100%   BMI 48.35 kg/m  General: no acute distress.  HEENT: normocephalic, atraumatic Heart: regular rate & rhythm.  No murmurs/rubs/gallops Lungs: clear to auscultation bilaterally, normal respiratory effort Abdomen: soft, gravid, non-tender;  EFW: 7lbs Pelvic:   External: Normal external female genitalia  Cervix: 4/50/-2, soft/midposition   Extremities: non-tender, symmetric, 1+ edema bilaterally.  DTRs: 2+  Neurologic: Alert & oriented x 3.    Results for orders placed or performed during the hospital encounter of 12/28/21 (from the past 24 hour(s))  Protein / creatinine ratio, urine     Status: None   Collection Time: 12/28/21  3:01 PM  Result Value Ref Range   Creatinine, Urine 276 mg/dL   Total Protein, Urine 22 mg/dL  Protein Creatinine Ratio 0.08 0.00 - 0.15 mg/mg[Cre]  CBC     Status: Abnormal   Collection Time: 12/28/21  3:50 PM  Result Value Ref Range   WBC 7.7 4.0 - 10.5 K/uL   RBC 3.81 (L) 3.87 - 5.11 MIL/uL   Hemoglobin 11.0 (L) 12.0 - 15.0 g/dL   HCT 37.3 (L) 66.8 - 15.9 %   MCV 86.6 80.0 - 100.0 fL   MCH 28.9 26.0 - 34.0 pg   MCHC 33.3 30.0 - 36.0 g/dL   RDW 47.0 76.1 - 51.8 %   Platelets 319 150 - 400 K/uL   nRBC 0.0 0.0 - 0.2 %  Comprehensive metabolic panel     Status: Abnormal   Collection Time: 12/28/21  3:50 PM  Result Value Ref Range   Sodium 137 135 - 145  mmol/L   Potassium 4.1 3.5 - 5.1 mmol/L   Chloride 110 98 - 111 mmol/L   CO2 22 22 - 32 mmol/L   Glucose, Bld 92 70 - 99 mg/dL   BUN <5 (L) 6 - 20 mg/dL   Creatinine, Ser 3.43 0.44 - 1.00 mg/dL   Calcium 8.8 (L) 8.9 - 10.3 mg/dL   Total Protein 6.4 (L) 6.5 - 8.1 g/dL   Albumin 2.7 (L) 3.5 - 5.0 g/dL   AST 33 15 - 41 U/L   ALT 26 0 - 44 U/L   Alkaline Phosphatase 167 (H) 38 - 126 U/L   Total Bilirubin 0.4 0.3 - 1.2 mg/dL   GFR, Estimated >73 >57 mL/min   Anion gap 5 5 - 15    Pertinent Results:  Prenatal Labs: Blood type/Rh A Pos  Antibody screen neg  Rubella Immune  Varicella Immune  RPR NR  HBsAg Neg  HIV NR  GC neg  Chlamydia neg  Genetic screening Negative materniT21  1 hour GTT  92  3 hour GTT   GBS  Pos- Clinda resistant   FHT: 140bpm, mod var, + accels, no decels TOCO: q1-108min    Cephalic by leopolds/SVE  Assessment:  ELEANA TOCCO is a 31 y.o. [id number] female at [redacted]w[redacted]d with GHTN, early labor.   Plan:  1. Admit to Labor & Delivery; consents reviewed and obtained - d/w Dr Dalbert Garnet who agrees with POC for admission and augmentation of labor. GHTN dx by 2 elevated BP   2. Fetal Well being  - Fetal Tracing: Cat I - Group B Streptococcus ppx indicated: POS - Presentation: cephalic confirmed by exam   3. Routine OB: - Prenatal labs reviewed, as above - Rh A Pos - CBC, T&S, RPR on admit - Clear fluids, IVF  4. Monitoring of Labor -  Contractions: external toco in place -  Pelvis proven to 3890gm -  Plan for AROM/Pitocin augment -  Plan for continuous fetal monitoring  -  Maternal pain control as desired; plans epidural - Anticipate vaginal delivery  5. Post Partum Planning: - Infant feeding: breast - Contraception: TBD - declined Tdap and Flu vaccines  Randa Ngo, CNM 12/28/21 5:50 PM

## 2021-12-28 NOTE — OB Triage Note (Signed)
Pt presents from the office for PIH eval. Pt denies LOF or bleeding. Pt reports positive fetal movement. Pt initial BP was 118/63. BPs cycling q 15 mins. Pt reports having a mild HA. Denies blurry vision or epigastric pain. No clonus. +2 reflexes. VSS will continue to monitor.

## 2021-12-28 NOTE — Anesthesia Procedure Notes (Signed)
Epidural Patient location during procedure: OB Start time: 12/28/2021 8:30 PM End time: 12/28/2021 8:43 PM  Staffing Anesthesiologist: Foye Deer, MD Performed: anesthesiologist   Preanesthetic Checklist Completed: patient identified, IV checked, site marked, risks and benefits discussed, surgical consent, monitors and equipment checked, pre-op evaluation and timeout performed  Epidural Patient position: sitting Prep: ChloraPrep Patient monitoring: heart rate, continuous pulse ox and blood pressure Approach: midline Location: L3-L4 Injection technique: LOR saline  Needle:  Needle type: Tuohy  Needle gauge: 18 G Needle length: 9 cm Needle insertion depth: 8.5 cm Catheter type: closed end Catheter size: 20 Guage Catheter at skin depth: 14 cm Test dose: negative and 1.5% lidocaine with Epi 1:200 K  Assessment Events: blood not aspirated, no cerebrospinal fluid, injection not painful, no injection resistance and no paresthesia  Additional Notes Reason for block:procedure for pain

## 2021-12-28 NOTE — Anesthesia Preprocedure Evaluation (Signed)
Anesthesia Evaluation  Patient identified by MRN, date of birth, ID band Patient awake    Reviewed: Allergy & Precautions, NPO status , Patient's Chart, lab work & pertinent test results  Airway Mallampati: III  TM Distance: >3 FB Neck ROM: full    Dental no notable dental hx.    Pulmonary neg pulmonary ROS, Current Smoker and Patient abstained from smoking.   Pulmonary exam normal        Cardiovascular Exercise Tolerance: Good hypertension (gHTN), Normal cardiovascular exam     Neuro/Psych    GI/Hepatic negative GI ROS,,,  Endo/Other    Morbid obesity  Renal/GU   negative genitourinary   Musculoskeletal   Abdominal  (+) + obese  Peds  Hematology negative hematology ROS (+)   Anesthesia Other Findings Past Medical History: 2014: Closed impacted fracture of left hip with routine healing     Comment:  no sx required 2014: Fracture of coccyx, initial encounter for closed fracture [facility])     Comment:  no sx required No date: Pregnancy     Comment:  EDC 09/29/2015  Past Surgical History: 03/05/2020: DILATION AND EVACUATION; N/A     Comment:  Procedure: DILATATION AND EVACUATION;  Surgeon:               Suzy Bouchard, MD;  Location: [facility] ORS;                Service: Gynecology;  Laterality: N/A; No date: TYMPANOSTOMY TUBE PLACEMENT  BMI    Body Mass Index: 48.35 kg/m      Reproductive/Obstetrics (+) Pregnancy                             Anesthesia Physical Anesthesia Plan  ASA: 3  Anesthesia Plan: Epidural   Post-op Pain Management:    Induction:   PONV Risk Score and Plan:   Airway Management Planned:   Additional Equipment:   Intra-op Plan:   Post-operative Plan:   Informed Consent: I have reviewed the patients History and Physical, chart, labs and discussed the procedure including the risks, benefits and alternatives for the proposed anesthesia with the  patient or authorized representative who has indicated his/her understanding and acceptance.       Plan Discussed with: Anesthesiologist  Anesthesia Plan Comments:         Anesthesia Quick Evaluation

## 2021-12-29 ENCOUNTER — Encounter: Payer: Self-pay | Admitting: Obstetrics and Gynecology

## 2021-12-29 LAB — RPR: RPR Ser Ql: NONREACTIVE

## 2021-12-29 MED ORDER — SODIUM CHLORIDE 0.9% FLUSH
3.0000 mL | Freq: Two times a day (BID) | INTRAVENOUS | Status: DC
  Administered 2021-12-29: 3 mL via INTRAVENOUS

## 2021-12-29 MED ORDER — SODIUM CHLORIDE 0.9% FLUSH
3.0000 mL | INTRAVENOUS | Status: DC | PRN
  Administered 2021-12-30: 3 mL via INTRAVENOUS

## 2021-12-29 MED ORDER — FLEET ENEMA 7-19 GM/118ML RE ENEM: 1.0000 | ENEMA | Freq: Every day | RECTAL | Status: DC | PRN

## 2021-12-29 MED ORDER — ONDANSETRON HCL 4 MG/2ML IJ SOLN: 4.0000 mg | INTRAMUSCULAR | Status: DC | PRN

## 2021-12-29 MED ORDER — WITCH HAZEL-GLYCERIN EX PADS: 1.0000 | MEDICATED_PAD | CUTANEOUS | Status: DC | PRN

## 2021-12-29 MED ORDER — SODIUM CHLORIDE 0.9 % IV SOLN
250.0000 mL | INTRAVENOUS | Status: DC | PRN
  Administered 2021-12-30: 250 mL via INTRAVENOUS

## 2021-12-29 MED ORDER — IBUPROFEN 600 MG PO TABS
600.0000 mg | ORAL_TABLET | Freq: Four times a day (QID) | ORAL | Status: DC
  Administered 2021-12-29 – 2021-12-30 (×3): 600 mg via ORAL
  Filled 2021-12-29 (×3): qty 1

## 2021-12-29 MED ORDER — SIMETHICONE 80 MG PO CHEW: 80.0000 mg | CHEWABLE_TABLET | ORAL | Status: DC | PRN

## 2021-12-29 MED ORDER — CEFAZOLIN SODIUM-DEXTROSE 1-4 GM/50ML-% IV SOLN
INTRAVENOUS | Status: AC
  Filled 2021-12-29: qty 50

## 2021-12-29 MED ORDER — ACETAMINOPHEN 325 MG PO TABS
650.0000 mg | ORAL_TABLET | ORAL | Status: DC | PRN
  Administered 2021-12-29: 650 mg via ORAL
  Filled 2021-12-29: qty 2

## 2021-12-29 MED ORDER — PRENATAL MULTIVITAMIN CH
1.0000 | ORAL_TABLET | Freq: Every day | ORAL | Status: DC
  Administered 2021-12-29 – 2021-12-30 (×2): 1 via ORAL
  Filled 2021-12-29 (×2): qty 1

## 2021-12-29 MED ORDER — ZOLPIDEM TARTRATE 5 MG PO TABS: 5.0000 mg | ORAL_TABLET | Freq: Every evening | ORAL | Status: DC | PRN

## 2021-12-29 MED ORDER — DIBUCAINE (PERIANAL) 1 % EX OINT: 1.0000 | TOPICAL_OINTMENT | CUTANEOUS | Status: DC | PRN

## 2021-12-29 MED ORDER — ONDANSETRON HCL 4 MG PO TABS: 4.0000 mg | ORAL_TABLET | ORAL | Status: DC | PRN

## 2021-12-29 MED ORDER — COCONUT OIL OIL: 1.0000 | TOPICAL_OIL | Status: DC | PRN

## 2021-12-29 MED ORDER — TRIAMCINOLONE 0.1 % CREAM:EUCERIN CREAM 1:1
TOPICAL_CREAM | Freq: Two times a day (BID) | CUTANEOUS | Status: DC
  Filled 2021-12-29: qty 1

## 2021-12-29 MED ORDER — BENZOCAINE-MENTHOL 20-0.5 % EX AERO: 1.0000 | INHALATION_SPRAY | CUTANEOUS | Status: DC | PRN

## 2021-12-29 MED ORDER — DIPHENHYDRAMINE HCL 25 MG PO CAPS: 25.0000 mg | ORAL_CAPSULE | Freq: Four times a day (QID) | ORAL | Status: DC | PRN

## 2021-12-29 MED ORDER — BISACODYL 10 MG RE SUPP: 10.0000 mg | Freq: Every day | RECTAL | Status: DC | PRN

## 2021-12-29 MED ORDER — SENNOSIDES-DOCUSATE SODIUM 8.6-50 MG PO TABS
2.0000 | ORAL_TABLET | ORAL | Status: DC
  Administered 2021-12-29 – 2021-12-30 (×2): 2 via ORAL
  Filled 2021-12-29 (×2): qty 2

## 2021-12-29 NOTE — Discharge Summary (Signed)
Obstetrical Discharge Summary  Patient Name: Susan Cantrell DOB: 06-09-1990 MRN: [id number]  Date of Admission: 12/28/2021 Date of Delivery: 12/29/21 Delivered by: Chari Manning, CNM  Date of Discharge: 12/30/21  Primary OB: [facility] NAT:[id number] last menstrual period was 12/08/2020 (exact date). EDC Estimated Date of Delivery: 01/09/22 Gestational Age at Delivery: [redacted]w[redacted]d   Antepartum complications:  1. Obesity, pre-preg BMI 50.8 2. 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 GBS Pos- Clinda resistant 4. MJ and tobacco use: has quit both.  5. Depression: previously on Zoloft 6. Hives during pregnancy, using topical hydrocortisone, seen by Derm.  7. Iron deficiency anemia, PO supplementation  Admitting Diagnosis: Gestational hypertension w/o significant proteinuria in 3rd trimester [O13.3]  Secondary Diagnosis: Patient Active Problem List   Diagnosis Date Noted   Gestational hypertension w/o significant proteinuria in 3rd trimester 12/28/2021   Right flank pain 12/10/2021   Supervision of high risk pregnancy in third trimester 06/17/2021   Depression, postpartum 05/29/2019   Supervision of other high risk pregnancy, antepartum 03/22/2019   Postpartum care following vaginal delivery 10/06/2015   Obesity, morbid, BMI 50 or higher (HCC) 10/06/2015   Labor and delivery, indication for care 10/04/2015   Non-reactive NST (non-stress test) 09/29/2015   Indication for care in labor and delivery, antepartum 09/10/2015   First trimester screening 03/24/2015    Discharge Diagnosis: Term Pregnancy Delivered and Gestational Hypertension      Augmentation: AROM and Pitocin Complications: None Intrapartum complications/course: see delivery note Delivery Type: spontaneous vaginal delivery Anesthesia: epidural anesthesia Placenta: spontaneous To Pathology: No  Laceration: none Episiotomy: none Newborn Data: Live born female "Susan Cantrell" Birth Weight:  7lb 9oz APGAR: 9, 9  Newborn Delivery   Birth date/time: 12/29/2021 09:05:00 Delivery type: Vaginal, Spontaneous      Postpartum Procedures:  IV iron  Edinburgh:     12/29/2021   10:00 PM 03/23/2019    1:00 PM  Edinburgh Postnatal Depression Scale Screening Tool  I have been able to laugh and see the funny side of things. 0 0  I have looked forward with enjoyment to things. 0 0  I have blamed myself unnecessarily when things went wrong. 0 0  I have been anxious or worried for no good reason. 0 3  I have felt scared or panicky for no good reason. 0 0  Things have been getting on top of me. 0 0  I have been so unhappy that I have had difficulty sleeping. 0 0  I have felt sad or miserable. 0 0  I have been so unhappy that I have been crying. 0 0  The thought of harming myself has occurred to me. 0 0  Edinburgh Postnatal Depression Scale Total 0 3     Post partum course:   Patient had an uncomplicated postpartum course.  By time of discharge on PPD#1, her pain was controlled on oral pain medications; she had appropriate lochia and was ambulating, voiding without difficulty and tolerating regular diet.  She was deemed stable for discharge to home.    Discharge Physical Exam:   BP (!) 113/54 (BP Location: Right Arm)   Pulse 61   Temp 98.2 F (36.8 C) (Oral)   Resp 20   Ht 5\' 8"  (1.727 m)   Wt (!) 144.2 kg   LMP 12/08/2020 (Exact Date)   SpO2 99%   Breastfeeding Unknown   BMI 48.35 kg/m   General: NAD CV: RRR Pulm: CTABL, nl effort ABD:  s/nd/nt, fundus firm and below the umbilicus Lochia: moderate Perineum: minimal edema/intact DVT Evaluation: LE non-ttp, no evidence of DVT on exam.  Hemoglobin  Date Value Ref Range Status  12/30/2021 9.4 (L) 12.0 - 15.0 g/dL Final   HGB  Date Value Ref Range Status  01/19/2014 11.8 (L) 12.0 - 16.0 g/dL Final   HCT  Date Value Ref Range Status  12/30/2021 28.6 (L) 36.0 - 46.0 % Final  01/19/2014 36.3  35.0 - 47.0 % Final    Risk assessment for postpartum VTE and prophylactic treatment: Very high risk factors: None High risk factors: BMI 40-50 kg/m2 Moderate risk factors: None  Postpartum VTE prophylaxis with LMWH not indicated  Disposition: stable, discharge to home. Baby Feeding: formula feeding Baby Disposition: home with mom  Rh Immune globulin indicated: No Rubella vaccine given: was not indicated Varivax vaccine given: was not indicated Flu vaccine given in AP setting: No Tdap vaccine given in AP setting: Yes  and No  Contraception: no method  Prenatal Labs:  Blood type/Rh A Pos  Antibody screen neg  Rubella Immune  Varicella Immune  RPR NR  HBsAg Neg  HIV NR  GC neg  Chlamydia neg  Genetic screening Negative materniT21  1 hour GTT  92  3 hour GTT    GBS  Pos- Clinda resistant    Plan:  Susan Cantrell was discharged to home in good condition. Follow-up appointment with delivering provider in 2 weeks for a mood check and 6 weeks PP visit  Discharge Medications: Allergies as of 12/30/2021       Reactions   Amoxicillin    Doxycycline    Oxycodone Swelling   Throat swelling        Medication List     TAKE these medications    acetaminophen 325 MG tablet Commonly known as: Tylenol Take 2 tablets (650 mg total) by mouth every 4 (four) hours as needed (for pain scale < 4).   benzocaine-Menthol 20-0.5 % Aero Commonly known as: DERMOPLAST Apply 1 Application topically as needed for irritation (perineal discomfort).   famotidine 20 MG tablet Commonly known as: PEPCID Take 1 tablet (20 mg total) by mouth 2 (two) times daily.   ferrous sulfate 325 (65 FE) MG tablet Take 1 tablet (325 mg total) by mouth 2 (two) times daily with a meal. For anemia, take with Vitamin C   ibuprofen 600 MG tablet Commonly known as: ADVIL Take 1 tablet (600 mg total) by mouth every 6 (six) hours.   prenatal multivitamin Tabs tablet Take 1 tablet by mouth daily at  12 noon.   witch hazel-glycerin pad Commonly known as: TUCKS Apply 1 Application topically as needed for hemorrhoids (for pain).         Follow-up Information     Chari Manning Rolla Plate, CNM Follow up in 2 week(s).   Specialty: Obstetrics Why: 2wk mood check Contact information: [address] [place] [address] [phone]         Sonny Dandy, CNM Follow up in 6 week(s).   Specialty: Obstetrics Why: 6wk postpartum Contact information: [address] [place] [address] [phone]                 Signed: Janyce Llanos, CNM 12/30/2021

## 2021-12-29 NOTE — Progress Notes (Signed)
Labor Progress Note  Susan Cantrell is a 31 y.o. [id number] at [redacted]w[redacted]d by LMP admitted for induction of labor due to [facility].  Subjective: feeling comfortable with epidural.   Objective: BP (!) 98/55   Pulse (!) 54   Temp 98.7 F (37.1 C) (Oral)   Resp 18   Ht 5\' 8"  (1.727 m)   Wt (!) 144.2 kg   LMP 12/08/2020 (Exact Date)   SpO2 97%   BMI 48.35 kg/m   Notable VS details:  Vitals:   12/29/21 0030 12/29/21 0100 12/29/21 0130 12/29/21 0200  BP: 116/79 122/83 (!) 115/59 125/79   12/29/21 0230 12/29/21 0300 12/29/21 0330 12/29/21 0400  BP: 128/73 136/72 (!) 125/94 126/77   12/29/21 0430 12/29/21 0500 12/29/21 0530 12/29/21 0600  BP: (!) 108/48 (!) 107/59 115/74 (!) 98/55     Fetal Assessment: FHT:  FHR: 130 bpm, variability: moderate,  accelerations:  Present,  decelerations:  Present early and variable Category/reactivity:  Category I UC:   regular, every 1.5-3 minutes, pitocin 37mu/min SVE:   6/75/-1; soft/anterior - IUPC placed Membrane status: AROM at 1946 Amniotic color: clear  Labs: Lab Results  Component Value Date   WBC 7.7 12/28/2021   HGB 11.0 (L) 12/28/2021   HCT 33.0 (L) 12/28/2021   MCV 86.6 12/28/2021   PLT 319 12/28/2021    Assessment / Plan: 12/30/2021 at [redacted]w[redacted]d  GHTN- has had 2 mild range BP  Labor: arom and Pitocin, IUPC placed now. Adequate MVUs.  Preeclampsia:  normal labs Fetal Wellbeing:  Category II Pain Control:  Epidural;  I/D:  GBS Pos- Ancef x 2 doses given Anticipated MOD:  NSVD  [redacted]w[redacted]d George Alcantar, CNM 12/29/2021, 6:21 AM

## 2021-12-30 LAB — CBC
HCT: 28.6 % — ABNORMAL LOW (ref 36.0–46.0)
Hemoglobin: 9.4 g/dL — ABNORMAL LOW (ref 12.0–15.0)
MCH: 28.3 pg (ref 26.0–34.0)
MCHC: 32.9 g/dL (ref 30.0–36.0)
MCV: 86.1 fL (ref 80.0–100.0)
Platelets: 264 10*3/uL (ref 150–400)
RBC: 3.32 MIL/uL — ABNORMAL LOW (ref 3.87–5.11)
RDW: 13.4 % (ref 11.5–15.5)
WBC: 10.1 10*3/uL (ref 4.0–10.5)
nRBC: 0 % (ref 0.0–0.2)

## 2021-12-30 MED ORDER — SODIUM CHLORIDE 0.9 % IV SOLN
300.0000 mg | Freq: Once | INTRAVENOUS | Status: AC
  Administered 2021-12-30: 300 mg via INTRAVENOUS
  Filled 2021-12-30: qty 300

## 2021-12-30 MED ORDER — IBUPROFEN 600 MG PO TABS: 600.0000 mg | ORAL_TABLET | Freq: Four times a day (QID) | ORAL | 0 refills | Status: DC

## 2021-12-30 MED ORDER — PRENATAL MULTIVITAMIN CH: 1.0000 | ORAL_TABLET | Freq: Every day | ORAL | Status: DC

## 2021-12-30 MED ORDER — IBUPROFEN 600 MG PO TABS
600.0000 mg | ORAL_TABLET | Freq: Four times a day (QID) | ORAL | Status: DC
  Administered 2021-12-30: 600 mg via ORAL
  Filled 2021-12-30: qty 1

## 2021-12-30 MED ORDER — BENZOCAINE-MENTHOL 20-0.5 % EX AERO: 1.0000 | INHALATION_SPRAY | CUTANEOUS | Status: DC | PRN

## 2021-12-30 MED ORDER — ACETAMINOPHEN 325 MG PO TABS: 650.0000 mg | ORAL_TABLET | ORAL | Status: DC | PRN

## 2021-12-30 MED ORDER — FERROUS SULFATE 325 (65 FE) MG PO TABS: 325.0000 mg | ORAL_TABLET | Freq: Two times a day (BID) | ORAL | 3 refills | Status: DC

## 2021-12-30 MED ORDER — WITCH HAZEL-GLYCERIN EX PADS: 1.0000 | MEDICATED_PAD | CUTANEOUS | 12 refills | Status: DC | PRN

## 2021-12-30 NOTE — Progress Notes (Signed)
Pt discharged with infant.  Discharge instructions, prescriptions and follow up appointment given to and reviewed with pt. Pt verbalized understanding. Escorted out by auxillary. 

## 2021-12-30 NOTE — Anesthesia Postprocedure Evaluation (Signed)
Anesthesia Post Note  Patient: Susan Cantrell  Procedure(s) Performed: AN AD HOC LABOR EPIDURAL  Patient location during evaluation: Mother Baby Anesthesia Type: Epidural Level of consciousness: awake and alert Pain management: pain level controlled Vital Signs Assessment: post-procedure vital signs reviewed and stable Respiratory status: spontaneous breathing, nonlabored ventilation and respiratory function stable Cardiovascular status: stable Postop Assessment: no headache, no backache and epidural receding Anesthetic complications: no   No notable events documented.   Last Vitals:  Vitals:   12/30/21 0326 12/30/21 0814  BP: (!) 105/53 (!) 113/54  Pulse: 67 61  Resp: 18 20  Temp: 36.7 C 36.8 C  SpO2: 100% 99%    Last Pain:  Vitals:   12/30/21 0814  TempSrc: Oral  PainSc:                  Karoline Caldwell

## 2021-12-30 NOTE — Progress Notes (Deleted)
Patient to OR via bed.

## 2022-02-01 ENCOUNTER — Telehealth: Payer: Self-pay

## 2022-02-02 ENCOUNTER — Telehealth: Payer: Self-pay

## 2022-02-02 NOTE — Telephone Encounter (Signed)
[facility]- Discharge Call Backs 1-Do you have any questions or concerns about yourself as you heal?No Vag Del & Healing well. 2-Any concerns or questions about your baby?No Is your baby eating, peeing,pooping well?Yes 3-Where does your baby sleep in your home?Crib Reviewed ABC's of safe sleep. 4-How was your stay at the hospital?Good -Except they would not do the circumcision while the baby was in the hospital. It was done 3 weeks later due to circumstances r/t Christmas, first peds appt at [facility] they would only schedule it for future visit, 3rd visit baby had a cough.   5-How did our team work together to care for you? Everything else went great except circumcision situation. You should be receiving a survey in the mail soon.   We would really appreciate it if you could fill that out for [place] and return it in the mail.  We value the feedback to make improvements and continue the great work we do.   If you have any questions please feel free to call me back at [phone]

## 2022-12-21 DIAGNOSIS — O0993 Supervision of high risk pregnancy, unspecified, third trimester: Secondary | ICD-10-CM | POA: Insufficient documentation

## 2023-01-19 LAB — OB RESULTS CONSOLE GC/CHLAMYDIA
Chlamydia: NEGATIVE
Neisseria Gonorrhea: NEGATIVE

## 2023-01-19 LAB — OB RESULTS CONSOLE VARICELLA ZOSTER ANTIBODY, IGG: Varicella: IMMUNE

## 2023-01-19 LAB — HEPATITIS C ANTIBODY: HCV Ab: NEGATIVE

## 2023-01-19 LAB — OB RESULTS CONSOLE RPR: RPR: NONREACTIVE

## 2023-01-19 LAB — OB RESULTS CONSOLE RUBELLA ANTIBODY, IGM: Rubella: IMMUNE

## 2023-01-19 LAB — OB RESULTS CONSOLE HEPATITIS B SURFACE ANTIGEN: Hepatitis B Surface Ag: NEGATIVE

## 2023-01-19 LAB — OB RESULTS CONSOLE HIV ANTIBODY (ROUTINE TESTING): HIV: NONREACTIVE

## 2023-01-20 ENCOUNTER — Telehealth: Payer: Self-pay | Admitting: Family Medicine

## 2023-01-20 NOTE — Telephone Encounter (Signed)
 I reached out to the patient to get her scheduled for the referral we received, no answer. Message left.

## 2023-02-01 ENCOUNTER — Other Ambulatory Visit: Payer: Self-pay | Admitting: Obstetrics

## 2023-02-01 DIAGNOSIS — O99210 Obesity complicating pregnancy, unspecified trimester: Secondary | ICD-10-CM

## 2023-03-21 ENCOUNTER — Other Ambulatory Visit: Payer: Self-pay | Admitting: Maternal & Fetal Medicine

## 2023-03-21 ENCOUNTER — Other Ambulatory Visit: Payer: Self-pay

## 2023-03-21 ENCOUNTER — Ambulatory Visit: Payer: Medicaid Other | Attending: Maternal & Fetal Medicine

## 2023-03-21 ENCOUNTER — Ambulatory Visit: Admitting: Maternal & Fetal Medicine

## 2023-03-21 VITALS — BP 113/69 | HR 88 | Temp 98.6°F | Ht 68.0 in | Wt 273.5 lb

## 2023-03-21 DIAGNOSIS — O99210 Obesity complicating pregnancy, unspecified trimester: Secondary | ICD-10-CM

## 2023-03-21 DIAGNOSIS — Z363 Encounter for antenatal screening for malformations: Secondary | ICD-10-CM | POA: Insufficient documentation

## 2023-03-21 DIAGNOSIS — Z3A20 20 weeks gestation of pregnancy: Secondary | ICD-10-CM

## 2023-03-21 DIAGNOSIS — O99213 Obesity complicating pregnancy, third trimester: Secondary | ICD-10-CM

## 2023-03-21 DIAGNOSIS — O0992 Supervision of high risk pregnancy, unspecified, second trimester: Secondary | ICD-10-CM | POA: Insufficient documentation

## 2023-03-21 DIAGNOSIS — Z8759 Personal history of other complications of pregnancy, childbirth and the puerperium: Secondary | ICD-10-CM

## 2023-03-21 DIAGNOSIS — O99212 Obesity complicating pregnancy, second trimester: Secondary | ICD-10-CM

## 2023-03-21 DIAGNOSIS — O09292 Supervision of pregnancy with other poor reproductive or obstetric history, second trimester: Secondary | ICD-10-CM | POA: Diagnosis not present

## 2023-03-21 DIAGNOSIS — E669 Obesity, unspecified: Secondary | ICD-10-CM | POA: Diagnosis not present

## 2023-03-21 NOTE — Progress Notes (Signed)
 Patient information  Patient Name: Susan Cantrell  Patient MRN:   [id number]   MFM CONSULT  Susan Cantrell is a 33 y.o. [id number] at Unknown here for ultrasound and consultation. Patient Active Problem List   Diagnosis Date Noted   History of gestational hypertension 03/21/2023   Gestational hypertension w/o significant proteinuria in 3rd trimester 12/28/2021   Supervision of high risk pregnancy in third trimester 06/17/2021   Obesity, morbid, BMI 50 or higher (HCC) 10/06/2015   Indication for care in labor and delivery, antepartum 09/10/2015   Susan Cantrell is doing well today with no acute concerns.    RE history of GHTN: Patient with history of gestational tension around 38 weeks.  I discussed the risk and impact of GHTN and preeclampsia on her pregnancy and the role of baseline laboratory assessment of kidney, liver and platelet count as well as the role of low dose Aspirin to the reduce the risk of developing preeclampsia. She has been compliant with aspirin 81 mg daily.  She understands signs and symptoms of preeclamapsia.   RE elevated BMI:  I discussed the major potential complications associated with obesity in pregnancy.  Some of these complications include but are not limited to increased risk of excessive maternal weight gain, fetal growth abnormalities, fetal congenital disorders, inability to visualize fetal anatomic structures on ultrasound, gestational diabetes, hypertensive disorders of pregnancy, operative birth including cesarean delivery or assisted vaginal delivery, delayed wound healing and many long-term health complications.  I discussed the need for continued growth ultrasounds and possibly antenatal testing depending upon how the pregnancy course progresses.  Maternal weight gain should be limited to 10 to 20 pounds during the pregnancy.  While normal weight loss may occur during the first and early second trimester, efforts to actively lose weight with the use of  medication is not recommended during pregnancy.  A whole food diet and regular exercise of at least 15 to 30 minutes of moderately strenuous activity is recommended in the absence of any contraindications. Weight loss with the use of medications is not recommended during pregnancy. I reassured the patient that we expect a favorable pregnancy outcome but due to her pregnancy complications she will need a higher level of monitoring for her pregnancy compared to a pregnancy without complications. The patient had time to ask questions that were answered to her satisfaction. She verbalized understanding of our discussion and request to proceed with the plan outlined in the recommendations.   Sonographic findings Single intrauterine pregnancy at 20w 0d  Fetal cardiac activity:  Observed and appears normal. Presentation: Cephalic. The anatomic structures that were well seen appear normal without evidence of soft markers. Due to poor acoustic windows some structures remain suboptimally visualized. Fetal biometry shows the estimated fetal weight at the 82 percentile.  Amniotic fluid: Within normal limits.  MVP: 4.5 cm. Placenta: Anterior. Adnexa: No abnormality visualized. Cervical length: 3.6 cm.  There are limitations of prenatal ultrasound such as the inability to detect certain abnormalities due to poor visualization. Various factors such as fetal position, gestational age and maternal body habitus may increase the difficulty in visualizing the fetal anatomy.    Recommendations - EDD should be 08/08/2023 based on  LMP  (11/01/22). - Follow up ultrasound in 4-6 weeks to attempt visualization of the anatomy not seen and reassess the fetal growth -Baseline preeclampsia labs: CMP, CBC and urine protein/creatinine ratio if not previously completed.  -Early GTT was normal. -Continue Aspirin 81 mg for preeclampsia prophylaxis. -  Follow-up anatomy and fetal growth in 4 to 6 weeks. -Serial growth ultrasounds  starting around 28 weeks to monitor for fetal growth restriction. -Antenatal testing to start around 34 weeks due pregravid BMI > 40.  -Delivery timing pending clinical course but likely around EDD. -Continue routine prenatal care with referring OB provider  Review of Systems: A review of systems was performed and was negative except per HPI   Vitals and Physical Exam    03/21/2023   12:53 PM 12/30/2021   11:32 AM 12/30/2021    8:14 AM  Vitals with BMI  Height 5\' 8"     Weight 273 lbs 8 oz    BMI 41.6    Systolic 113 136 161  Diastolic 69 81 54  Pulse 88 61 61    Sitting comfortably on the sonogram table Nonlabored breathing Normal rate and rhythm Abdomen is nontender  Past pregnancies OB History  Gravida Para Term Preterm AB Living  5 3 3  2 3   SAB IAB Ectopic Multiple Live Births  1   0 3    # Outcome Date GA Lbr Len/2nd Weight Sex Type Anes PTL Lv  5 Term 12/29/21 [redacted]w[redacted]d 04:54 / 00:11 3430 g M Vag-Spont EPI  LIV  4 Term 03/22/19 [redacted]w[redacted]d 07:50 / 00:14 3540 g F Vag-Spont EPI  LIV  3 Term 10/06/15 [redacted]w[redacted]d / 01:03 3890 g F Vag-Spont EPI  LIV  2 SAB           1 AB              I spent 30 minutes reviewing the patients chart, including labs and images as well as counseling the patient about her medical conditions. Greater than 50% of the time was spent in direct face-to-face patient counseling.  Braxton Feathers, DO Maternal fetal medicine, [place]   03/21/2023  2:58 PM

## 2023-03-21 NOTE — Progress Notes (Deleted)
 See consult note

## 2023-03-23 DIAGNOSIS — O09899 Supervision of other high risk pregnancies, unspecified trimester: Secondary | ICD-10-CM | POA: Insufficient documentation

## 2023-04-18 DIAGNOSIS — E669 Obesity, unspecified: Secondary | ICD-10-CM | POA: Insufficient documentation

## 2023-04-20 ENCOUNTER — Other Ambulatory Visit: Payer: Self-pay

## 2023-04-20 ENCOUNTER — Ambulatory Visit: Attending: Maternal & Fetal Medicine

## 2023-04-20 DIAGNOSIS — Z3A24 24 weeks gestation of pregnancy: Secondary | ICD-10-CM | POA: Diagnosis not present

## 2023-04-20 DIAGNOSIS — O09292 Supervision of pregnancy with other poor reproductive or obstetric history, second trimester: Secondary | ICD-10-CM

## 2023-04-20 DIAGNOSIS — E669 Obesity, unspecified: Secondary | ICD-10-CM | POA: Diagnosis not present

## 2023-04-20 DIAGNOSIS — O99212 Obesity complicating pregnancy, second trimester: Secondary | ICD-10-CM | POA: Diagnosis not present

## 2023-04-20 DIAGNOSIS — Z362 Encounter for other antenatal screening follow-up: Secondary | ICD-10-CM | POA: Diagnosis not present

## 2023-04-20 DIAGNOSIS — O99213 Obesity complicating pregnancy, third trimester: Secondary | ICD-10-CM

## 2023-05-18 ENCOUNTER — Ambulatory Visit

## 2023-05-18 LAB — OB RESULTS CONSOLE RPR: RPR: NONREACTIVE

## 2023-05-18 LAB — OB RESULTS CONSOLE HIV ANTIBODY (ROUTINE TESTING): HIV: NONREACTIVE

## 2023-05-18 LAB — OB RESULTS CONSOLE GC/CHLAMYDIA
Chlamydia: POSITIVE
Neisseria Gonorrhea: NEGATIVE

## 2023-07-14 LAB — OB RESULTS CONSOLE GBS: GBS: NEGATIVE

## 2023-07-27 ENCOUNTER — Other Ambulatory Visit: Payer: Self-pay | Admitting: Obstetrics

## 2023-07-27 DIAGNOSIS — Z3490 Encounter for supervision of normal pregnancy, unspecified, unspecified trimester: Secondary | ICD-10-CM

## 2023-08-01 ENCOUNTER — Encounter: Payer: Self-pay | Admitting: Obstetrics and Gynecology

## 2023-08-01 ENCOUNTER — Inpatient Hospital Stay: Admitting: Anesthesiology

## 2023-08-01 ENCOUNTER — Other Ambulatory Visit: Payer: Self-pay

## 2023-08-01 ENCOUNTER — Inpatient Hospital Stay
Admission: EM | Admit: 2023-08-01 | Discharge: 2023-08-02 | DRG: 806 | Disposition: A | Discharge status: Home / Self Care | Attending: Obstetrics | Admitting: Obstetrics

## 2023-08-01 DIAGNOSIS — Z880 Allergy status to penicillin: Secondary | ICD-10-CM

## 2023-08-01 DIAGNOSIS — O99334 Smoking (tobacco) complicating childbirth: Secondary | ICD-10-CM | POA: Diagnosis present

## 2023-08-01 DIAGNOSIS — O9832 Other infections with a predominantly sexual mode of transmission complicating childbirth: Secondary | ICD-10-CM | POA: Diagnosis present

## 2023-08-01 DIAGNOSIS — O9081 Anemia of the puerperium: Secondary | ICD-10-CM | POA: Diagnosis not present

## 2023-08-01 DIAGNOSIS — F1290 Cannabis use, unspecified, uncomplicated: Secondary | ICD-10-CM | POA: Diagnosis present

## 2023-08-01 DIAGNOSIS — Z3A39 39 weeks gestation of pregnancy: Secondary | ICD-10-CM

## 2023-08-01 DIAGNOSIS — O99324 Drug use complicating childbirth: Secondary | ICD-10-CM | POA: Diagnosis present

## 2023-08-01 DIAGNOSIS — O9962 Diseases of the digestive system complicating childbirth: Secondary | ICD-10-CM | POA: Diagnosis present

## 2023-08-01 DIAGNOSIS — F17210 Nicotine dependence, cigarettes, uncomplicated: Secondary | ICD-10-CM | POA: Diagnosis present

## 2023-08-01 DIAGNOSIS — A6000 Herpesviral infection of urogenital system, unspecified: Secondary | ICD-10-CM | POA: Diagnosis present

## 2023-08-01 DIAGNOSIS — R7303 Prediabetes: Secondary | ICD-10-CM | POA: Diagnosis present

## 2023-08-01 DIAGNOSIS — Z3490 Encounter for supervision of normal pregnancy, unspecified, unspecified trimester: Principal | ICD-10-CM | POA: Diagnosis present

## 2023-08-01 DIAGNOSIS — K219 Gastro-esophageal reflux disease without esophagitis: Secondary | ICD-10-CM | POA: Diagnosis present

## 2023-08-01 DIAGNOSIS — D62 Acute posthemorrhagic anemia: Secondary | ICD-10-CM | POA: Diagnosis not present

## 2023-08-01 DIAGNOSIS — Z833 Family history of diabetes mellitus: Secondary | ICD-10-CM

## 2023-08-01 DIAGNOSIS — O99214 Obesity complicating childbirth: Secondary | ICD-10-CM | POA: Diagnosis present

## 2023-08-01 LAB — TYPE AND SCREEN
ABO/RH(D): A POS
Antibody Screen: NEGATIVE

## 2023-08-01 LAB — URINE DRUG SCREEN, QUALITATIVE (ARMC ONLY)
Amphetamines, Ur Screen: NOT DETECTED
Barbiturates, Ur Screen: NOT DETECTED
Benzodiazepine, Ur Scrn: NOT DETECTED
Cannabinoid 50 Ng, Ur ~~LOC~~: NOT DETECTED
Cocaine Metabolite,Ur ~~LOC~~: NOT DETECTED
MDMA (Ecstasy)Ur Screen: NOT DETECTED
Methadone Scn, Ur: NOT DETECTED
Opiate, Ur Screen: NOT DETECTED
Phencyclidine (PCP) Ur S: NOT DETECTED
Tricyclic, Ur Screen: NOT DETECTED

## 2023-08-01 LAB — CHLAMYDIA/NGC RT PCR (ARMC ONLY)
Chlamydia Tr: NOT DETECTED
N gonorrhoeae: NOT DETECTED

## 2023-08-01 LAB — CBC
HCT: 31.6 % — ABNORMAL LOW (ref 36.0–46.0)
Hemoglobin: 10.8 g/dL — ABNORMAL LOW (ref 12.0–15.0)
MCH: 29.9 pg (ref 26.0–34.0)
MCHC: 34.2 g/dL (ref 30.0–36.0)
MCV: 87.5 fL (ref 80.0–100.0)
Platelets: 317 K/uL (ref 150–400)
RBC: 3.61 MIL/uL — ABNORMAL LOW (ref 3.87–5.11)
RDW: 13.2 % (ref 11.5–15.5)
WBC: 7.5 K/uL (ref 4.0–10.5)
nRBC: 0 % (ref 0.0–0.2)

## 2023-08-01 LAB — RPR: RPR Ser Ql: NONREACTIVE

## 2023-08-01 MED ORDER — PHENYLEPHRINE 80 MCG/ML (10ML) SYRINGE FOR IV PUSH (FOR BLOOD PRESSURE SUPPORT): 80.0000 ug | PREFILLED_SYRINGE | INTRAVENOUS | Status: DC | PRN

## 2023-08-01 MED ORDER — OXYTOCIN-SODIUM CHLORIDE 30-0.9 UT/500ML-% IV SOLN: 2.5000 [IU]/h | INTRAVENOUS | Status: DC

## 2023-08-01 MED ORDER — SODIUM CHLORIDE 0.9 % IV SOLN: 250.0000 mL | INTRAVENOUS | Status: DC | PRN

## 2023-08-01 MED ORDER — BISACODYL 10 MG RE SUPP: 10.0000 mg | Freq: Every day | RECTAL | Status: DC | PRN

## 2023-08-01 MED ORDER — OXYTOCIN 10 UNIT/ML IJ SOLN
INTRAMUSCULAR | Status: DC
Start: 2023-08-01 — End: 2023-08-01
  Filled 2023-08-01: qty 2

## 2023-08-01 MED ORDER — ACETAMINOPHEN 500 MG PO TABS
1000.0000 mg | ORAL_TABLET | Freq: Four times a day (QID) | ORAL | Status: DC | PRN
  Administered 2023-08-01 – 2023-08-02 (×2): 1000 mg via ORAL
  Filled 2023-08-01 (×2): qty 2

## 2023-08-01 MED ORDER — SODIUM CHLORIDE 0.9% FLUSH: 3.0000 mL | INTRAVENOUS | Status: DC | PRN

## 2023-08-01 MED ORDER — LIDOCAINE-EPINEPHRINE (PF) 1.5 %-1:200000 IJ SOLN
INTRAMUSCULAR | Status: DC | PRN
  Administered 2023-08-01: 5 mL via EPIDURAL
  Administered 2023-08-01: 3 mL via EPIDURAL

## 2023-08-01 MED ORDER — OXYTOCIN-SODIUM CHLORIDE 30-0.9 UT/500ML-% IV SOLN: 1.0000 m[IU]/min | INTRAVENOUS | Status: DC

## 2023-08-01 MED ORDER — LIDOCAINE HCL (PF) 1 % IJ SOLN
INTRAMUSCULAR | Status: AC
Start: 2023-08-01 — End: 2023-08-01
  Filled 2023-08-01: qty 30

## 2023-08-01 MED ORDER — OXYTOCIN-SODIUM CHLORIDE 30-0.9 UT/500ML-% IV SOLN
INTRAVENOUS | Status: AC
  Administered 2023-08-01: 2 m[IU]/min via INTRAVENOUS
  Filled 2023-08-01: qty 500

## 2023-08-01 MED ORDER — WITCH HAZEL-GLYCERIN EX PADS
1.0000 | MEDICATED_PAD | CUTANEOUS | Status: DC | PRN
  Filled 2023-08-01: qty 100

## 2023-08-01 MED ORDER — SOD CITRATE-CITRIC ACID 500-334 MG/5ML PO SOLN: 30.0000 mL | ORAL | Status: DC | PRN

## 2023-08-01 MED ORDER — SIMETHICONE 80 MG PO CHEW: 80.0000 mg | CHEWABLE_TABLET | ORAL | Status: DC | PRN

## 2023-08-01 MED ORDER — DIPHENHYDRAMINE HCL 50 MG/ML IJ SOLN
50.0000 mg | Freq: Once | INTRAMUSCULAR | Status: AC
  Administered 2023-08-01: 50 mg via INTRAVENOUS
  Filled 2023-08-01: qty 1

## 2023-08-01 MED ORDER — LIDOCAINE HCL (PF) 1 % IJ SOLN
INTRAMUSCULAR | Status: DC | PRN
  Administered 2023-08-01: 3 mL
  Administered 2023-08-01: 2 mL
  Administered 2023-08-01: 3 mL

## 2023-08-01 MED ORDER — LIDOCAINE HCL (PF) 1 % IJ SOLN: 30.0000 mL | INTRAMUSCULAR | Status: DC | PRN

## 2023-08-01 MED ORDER — MISOPROSTOL 25 MCG QUARTER TABLET
25.0000 ug | ORAL_TABLET | ORAL | Status: DC
  Filled 2023-08-01: qty 1

## 2023-08-01 MED ORDER — DIPHENHYDRAMINE HCL 50 MG/ML IJ SOLN: 12.5000 mg | INTRAMUSCULAR | Status: DC | PRN

## 2023-08-01 MED ORDER — CALCIUM CARBONATE ANTACID 500 MG PO CHEW
2.0000 | CHEWABLE_TABLET | Freq: Once | ORAL | Status: AC
  Administered 2023-08-01: 400 mg via ORAL

## 2023-08-01 MED ORDER — ACETAMINOPHEN 325 MG PO TABS: 650.0000 mg | ORAL_TABLET | ORAL | Status: DC | PRN

## 2023-08-01 MED ORDER — FENTANYL CITRATE (PF) 100 MCG/2ML IJ SOLN: 50.0000 ug | INTRAMUSCULAR | Status: DC | PRN

## 2023-08-01 MED ORDER — LACTATED RINGERS IV SOLN: 500.0000 mL | INTRAVENOUS | Status: DC | PRN

## 2023-08-01 MED ORDER — TERBUTALINE SULFATE 1 MG/ML IJ SOLN: 0.2500 mg | Freq: Once | INTRAMUSCULAR | Status: DC | PRN

## 2023-08-01 MED ORDER — LACTATED RINGERS IV SOLN: INTRAVENOUS | Status: DC

## 2023-08-01 MED ORDER — FENTANYL-BUPIVACAINE-NACL 0.5-0.125-0.9 MG/250ML-% EP SOLN
12.0000 mL/h | EPIDURAL | Status: DC | PRN
  Administered 2023-08-01: 12 mL/h via EPIDURAL

## 2023-08-01 MED ORDER — EPHEDRINE 5 MG/ML INJ: 10.0000 mg | INTRAVENOUS | Status: DC | PRN

## 2023-08-01 MED ORDER — COCONUT OIL OIL: 1.0000 | TOPICAL_OIL | Status: DC | PRN

## 2023-08-01 MED ORDER — OXYTOCIN BOLUS FROM INFUSION
333.0000 mL | Freq: Once | INTRAVENOUS | Status: AC
  Administered 2023-08-01: 333 mL via INTRAVENOUS

## 2023-08-01 MED ORDER — IBUPROFEN 600 MG PO TABS
600.0000 mg | ORAL_TABLET | Freq: Four times a day (QID) | ORAL | Status: DC
  Administered 2023-08-01 – 2023-08-02 (×3): 600 mg via ORAL
  Filled 2023-08-01 (×4): qty 1

## 2023-08-01 MED ORDER — BENZOCAINE-MENTHOL 20-0.5 % EX AERO
1.0000 | INHALATION_SPRAY | CUTANEOUS | Status: DC | PRN
  Filled 2023-08-01: qty 56

## 2023-08-01 MED ORDER — PRENATAL MULTIVITAMIN CH
1.0000 | ORAL_TABLET | Freq: Every day | ORAL | Status: DC
  Administered 2023-08-02: 1 via ORAL
  Filled 2023-08-01: qty 1

## 2023-08-01 MED ORDER — CALCIUM CARBONATE ANTACID 500 MG PO CHEW
CHEWABLE_TABLET | ORAL | Status: AC
  Filled 2023-08-01: qty 1

## 2023-08-01 MED ORDER — MISOPROSTOL 200 MCG PO TABS
ORAL_TABLET | ORAL | Status: AC
  Filled 2023-08-01: qty 4

## 2023-08-01 MED ORDER — ONDANSETRON HCL 4 MG/2ML IJ SOLN: 4.0000 mg | INTRAMUSCULAR | Status: DC | PRN

## 2023-08-01 MED ORDER — LACTATED RINGERS IV SOLN
500.0000 mL | Freq: Once | INTRAVENOUS | Status: AC
  Administered 2023-08-01: 500 mL via INTRAVENOUS

## 2023-08-01 MED ORDER — BUPIVACAINE HCL (PF) 0.25 % IJ SOLN
INTRAMUSCULAR | Status: DC | PRN
  Administered 2023-08-01 (×2): 4 mL via EPIDURAL
  Administered 2023-08-01: 5 mL via EPIDURAL
  Administered 2023-08-01 (×3): 4 mL via EPIDURAL

## 2023-08-01 MED ORDER — ONDANSETRON HCL 4 MG PO TABS: 4.0000 mg | ORAL_TABLET | ORAL | Status: DC | PRN

## 2023-08-01 MED ORDER — SENNOSIDES-DOCUSATE SODIUM 8.6-50 MG PO TABS
2.0000 | ORAL_TABLET | ORAL | Status: DC
  Administered 2023-08-01: 2 via ORAL
  Filled 2023-08-01: qty 2

## 2023-08-01 MED ORDER — SODIUM CHLORIDE 0.9% FLUSH: 3.0000 mL | Freq: Two times a day (BID) | INTRAVENOUS | Status: DC

## 2023-08-01 MED ORDER — ONDANSETRON HCL 4 MG/2ML IJ SOLN
4.0000 mg | Freq: Four times a day (QID) | INTRAMUSCULAR | Status: DC | PRN
  Administered 2023-08-01: 4 mg via INTRAVENOUS
  Filled 2023-08-01: qty 2

## 2023-08-01 MED ORDER — AMMONIA AROMATIC IN INHA
RESPIRATORY_TRACT | Status: AC
  Filled 2023-08-01: qty 10

## 2023-08-01 MED ORDER — ZOLPIDEM TARTRATE 5 MG PO TABS: 5.0000 mg | ORAL_TABLET | Freq: Every evening | ORAL | Status: DC | PRN

## 2023-08-01 MED ORDER — FLEET ENEMA RE ENEM: 1.0000 | ENEMA | Freq: Every day | RECTAL | Status: DC | PRN

## 2023-08-01 MED ORDER — DIPHENHYDRAMINE HCL 25 MG PO CAPS: 25.0000 mg | ORAL_CAPSULE | Freq: Four times a day (QID) | ORAL | Status: DC | PRN

## 2023-08-01 MED ORDER — DIBUCAINE (PERIANAL) 1 % EX OINT: 1.0000 | TOPICAL_OINTMENT | CUTANEOUS | Status: DC | PRN

## 2023-08-01 MED ORDER — FENTANYL-BUPIVACAINE-NACL 0.5-0.125-0.9 MG/250ML-% EP SOLN
EPIDURAL | Status: AC
  Filled 2023-08-01: qty 250

## 2023-08-01 NOTE — Progress Notes (Signed)
 Delivery Note  Susan Cantrell is a H2E6966 at 106w0d with an LMP of 11/01/23, consistent with US  at [redacted]w[redacted]d.   First Stage: Labor onset: 0829 Induction: oxytocin , AROM, and cervical balloon Analgesia Susan Cantrell intrapartum: Epidural Date/time: 08/01/23 @ 0838 GBS: Negative/-- (07/03 0000)  IP Antibiotics: abx: nokne  Second Stage: Complete dilation at 1522 Onset of pushing at 1526 FHR second stage Baseline: 130 bpm variable decels with pushing   Susan Cantrell presented to L&D for IOL She was 2.5/50/-2. She progressed  to C/C/+2 with a Spontaneous urge to push.  She pushed  effectively over approximately 5 minutes for a spontaneous vaginal birth. Delivery of a viable baby boy on 08/01/2023 . by CNM. Delivery of fetal head in position: Occiput,, Anterior position with restitution to position: Left,, Occiput,, Transverse tight nuchal cord; reduced Anterior then posterior shoulders delivered easily with gentle downward traction. Baby placed on mom's chest, and attended to by baby RN. Cord double clamped after cessation of pulsation, cut by FOB    Third Stage: Oxytocin  bolus started after delivery of infant for hemorrhage prophylaxis  Placenta delivered Schutlz intact with a 3 VC @ 1537 Placenta disposition: To Pathology: No  Uterine tone firm / exam; vaginal bleeding: minimal  Laceration: no laceration identified  Anesthesia for repair: procedures; anesthesia: epidural x 3 Repair n/a Est. Blood Loss (mL): 100  Complications:Diagnoses; OB-GYN delivery complications: intermittent category 2 FHT tracing  Mom to postpartum.  Baby to Couplet care / Skin to Skin.  Newborn: Information for the patient's newborn:  Susan Cantrell, Susan Cantrell [[id number]]  Live born maleCameron Birth Weight: 7 lb 10.4 oz (3470 g) APGAR: 8, 9  Newborn Delivery   Birth date/time: 08/01/2023 15:31:00 Delivery type: Vaginal, Spontaneous      Feeding planned: formula feeding  ---------- Bobbette Brunswick,  CNM Certified Nurse Midwife [facility]

## 2023-08-01 NOTE — Anesthesia Procedure Notes (Signed)
 Epidural Patient location during procedure: OB Start time: 08/01/2023 10:55 AM End time: 08/01/2023 11:02 AM  Staffing Anesthesiologist: Piscitello, Fairy POUR, MD Resident/CRNA: Jaylene Nest, CRNA Performed: anesthesiologist   Preanesthetic Checklist Completed: patient identified, IV checked, site marked, risks and benefits discussed, surgical consent, monitors and equipment checked, pre-op evaluation and timeout performed  Epidural Patient position: sitting Prep: ChloraPrep Patient monitoring: heart rate, continuous pulse ox and blood pressure Approach: midline Location: L3-L4 Injection technique: LOR air  Needle:  Needle type: Tuohy  Needle gauge: 17 G Needle length: 9 cm and 9 Needle insertion depth: 9 cm Catheter type: closed end flexible Catheter size: 19 Gauge Catheter at skin depth: 13 cm Test dose: negative and 1.5% lidocaine  with Epi 1:200 K  Assessment Sensory level: T10 Events: blood not aspirated, no cerebrospinal fluid, injection not painful, no injection resistance, no paresthesia and negative IV test  Additional Notes 1 attempt Pt. Evaluated and documentation done after procedure finished. Patient identified. Risks/Benefits/Options discussed with patient including but not limited to bleeding, infection, nerve damage, paralysis, failed block, incomplete pain control, headache, blood pressure changes, nausea, vomiting, reactions to medication both or allergic, itching and postpartum back pain. Confirmed with bedside nurse the patient's most recent platelet count. Confirmed with patient that they are not currently taking any anticoagulation, have any bleeding history or any family history of bleeding disorders. Patient expressed understanding and wished to proceed. All questions were answered. Sterile technique was used throughout the entire procedure. Please see nursing notes for vital signs. Test dose was given through epidural catheter and negative prior to  continuing to dose epidural or start infusion. Warning signs of high block given to the patient including shortness of breath, tingling/numbness in hands, complete motor block, or any concerning symptoms with instructions to call for help. Patient was given instructions on fall risk and not to get out of bed. All questions and concerns addressed with instructions to call with any issues or inadequate analgesia.    Patient tolerated the insertion well without immediate complications.Reason for block:procedure for pain

## 2023-08-01 NOTE — Progress Notes (Signed)
 L&D Note    Subjective:  Tearful,feeling pain with contractions  Objective:      08/01/2023   11:22 AM 08/01/2023   11:03 AM 08/01/2023    9:48 AM  Vitals with BMI  Height 5' 8    Weight 268 lbs 15 oz    BMI 40.9    Systolic  104 121  Diastolic  66 73  Pulse  62 59     Gen: alert, cooperative, no distress FHR: Baseline: 130 bpm, Variability: moderate, Accels: Present, Decels: repetitive variables after position change Toco: regular, every 1.5-2.5 minutes SVE: Dilation: 6 Effacement (%): 70 Cervical Position: Posterior Station: -1 Presentation: Vertex Exam by:: Cantrell, CNM  Medications SCHEDULED MEDICATIONS   ammonia        calcium  carbonate       misoprostol        misoprostol   25 mcg Oral Q4H   And   misoprostol   25 mcg Vaginal Q4H   oxytocin        oxytocin  40 units in LR 1000 mL  333 mL Intravenous Once    MEDICATION INFUSIONS   fentaNYL  2 mcg/mL w/bupivacaine  0.125% in NS 250 mL 12 mL/hr (08/01/23 0830)   lactated ringers      lactated ringers  125 mL/hr at 08/01/23 1117   oxytocin      oxytocin  Stopped (08/01/23 1403)    PRN MEDICATIONS  acetaminophen , ammonia , calcium  carbonate, diphenhydrAMINE , ePHEDrine , ePHEDrine , fentaNYL  (SUBLIMAZE ) injection, fentaNYL  2 mcg/mL w/bupivacaine  0.125% in NS 250 mL, lactated ringers , lidocaine  (PF), misoprostol , ondansetron , oxytocin , phenylephrine , phenylephrine , sodium citrate -citric acid , terbutaline    Assessment & Plan:  33 y.o. [id number] at [redacted]w[redacted]d admitted for Labor_induction_indication: Obesity BMI>40 -GBS: negative -IP Antibiotics: abx: none -Membranes clear fluid -Recheck:Evaluated by digital exam. -Preeclampsia:  normotensive -Pain: level of pain (1-10, 10 severe), 10/10 -Intervention: IV fluid bolus, change maternal position, reduce stimulation (IV Pitocin ), anticipate vaginal delivery, and placed FSE, IUPC intact -Pe_uterus_labor: Adequate relaxation between contractions. -Analgesia: regional anesthesiax 2,  anesthesia notified and offered to place a 3rd epidural and patient declined  Dr. CHARM Schermerhorn updated of labor events   Susan Cantrell, CNM  08/01/2023 2:18 PM  [facility]

## 2023-08-01 NOTE — Anesthesia Procedure Notes (Signed)
 Epidural Patient location during procedure: OB Start time: 08/01/2023 2:54 PM End time: 08/01/2023 3:09 PM  Staffing Anesthesiologist: Piscitello, Fairy POUR, MD Resident/CRNA: Dominica Krabbe, CRNA Performed: resident/CRNA   Preanesthetic Checklist Completed: patient identified, IV checked, site marked, risks and benefits discussed, surgical consent, monitors and equipment checked, pre-op evaluation and timeout performed  Epidural Patient position: sitting Prep: ChloraPrep Patient monitoring: heart rate, continuous pulse ox and blood pressure Approach: midline Location: L3-L4 Injection technique: LOR saline  Needle:  Needle type: Tuohy  Needle gauge: 17 G Needle length: 9 cm and 9 Needle insertion depth: 10 cm Catheter type: closed end flexible Catheter size: 19 Gauge Catheter at skin depth: 15 cm Test dose: negative and 1.5% lidocaine  with Epi 1:200 K  Assessment Sensory level: T10 Events: blood not aspirated, no cerebrospinal fluid, injection not painful, no injection resistance, no paresthesia and negative IV test  Additional Notes 1 attempt Pt. Evaluated and documentation done after procedure finished. Patient identified. Risks/Benefits/Options discussed with patient including but not limited to bleeding, infection, nerve damage, paralysis, failed block, incomplete pain control, headache, blood pressure changes, nausea, vomiting, reactions to medication both or allergic, itching and postpartum back pain. Confirmed with bedside nurse the patient's most recent platelet count. Confirmed with patient that they are not currently taking any anticoagulation, have any bleeding history or any family history of bleeding disorders. Patient expressed understanding and wished to proceed. All questions were answered. Sterile technique was used throughout the entire procedure. Please see nursing notes for vital signs. Test dose was given through epidural catheter and negative prior to  continuing to dose epidural or start infusion. Warning signs of high block given to the patient including shortness of breath, tingling/numbness in hands, complete motor block, or any concerning symptoms with instructions to call for help. Patient was given instructions on fall risk and not to get out of bed. All questions and concerns addressed with instructions to call with any issues or inadequate analgesia.    Patient tolerated the insertion well without immediate complications.Reason for block:procedure for pain

## 2023-08-01 NOTE — Anesthesia Preprocedure Evaluation (Signed)
 Anesthesia Evaluation  Patient identified by MRN, date of birth, ID band Patient awake    Reviewed: Allergy & Precautions, H&P , NPO status , Patient's Chart, lab work & pertinent test results  Airway Mallampati: II  TM Distance: >3 FB     Dental no notable dental hx.    Pulmonary Current Smoker and Patient abstained from smoking.   Pulmonary exam normal        Cardiovascular hypertension, Normal cardiovascular exam     Neuro/Psych  PSYCHIATRIC DISORDERS  Depression    negative neurological ROS     GI/Hepatic Neg liver ROS,GERD  ,,  Endo/Other  negative endocrine ROS    Renal/GU negative Renal ROS  negative genitourinary   Musculoskeletal   Abdominal   Peds  Hematology negative hematology ROS (+)   Anesthesia Other Findings   Reproductive/Obstetrics (+) Pregnancy                              Anesthesia Physical Anesthesia Plan  ASA: 2  Anesthesia Plan: Epidural   Post-op Pain Management:    Induction:   PONV Risk Score and Plan:   Airway Management Planned:   Additional Equipment:   Intra-op Plan:   Post-operative Plan:   Informed Consent:   Plan Discussed with: Anesthesiologist and CRNA  Anesthesia Plan Comments:         Anesthesia Quick Evaluation

## 2023-08-01 NOTE — Progress Notes (Signed)
 L&D Note    Subjective:  Resting in bed with family at bedside becoming more comfortable after her epidural  Objective:      08/01/2023    9:03 AM 08/01/2023    8:48 AM 08/01/2023    8:45 AM  Vitals with BMI  Systolic 107 102 865  Diastolic 64 53 38  Pulse 63 60 64     Gen: alert, cooperative, no distress FHR: Baseline: 130 bpm, Variability: moderate, Accels: Present, Decels: none Toco: regular, every 1.5-2 minutes SVE: Dilation: 6 Effacement (%): 70 Cervical Position: Posterior Station: -2 Presentation: Vertex Exam by:: Aisha, CNM  Medications SCHEDULED MEDICATIONS   ammonia        misoprostol        misoprostol   25 mcg Oral Q4H   And   misoprostol   25 mcg Vaginal Q4H   oxytocin        oxytocin  40 units in LR 1000 mL  333 mL Intravenous Once    MEDICATION INFUSIONS   fentaNYL  2 mcg/mL w/bupivacaine  0.125% in NS 250 mL 12 mL/hr (08/01/23 0830)   lactated ringers      lactated ringers  125 mL/hr at 08/01/23 0606   oxytocin      oxytocin  4 milli-units/min (08/01/23 0753)    PRN MEDICATIONS  acetaminophen , ammonia , diphenhydrAMINE , ePHEDrine , ePHEDrine , fentaNYL  (SUBLIMAZE ) injection, fentaNYL  2 mcg/mL w/bupivacaine  0.125% in NS 250 mL, lactated ringers , lidocaine  (PF), misoprostol , ondansetron , oxytocin , phenylephrine , phenylephrine , sodium citrate -citric acid , terbutaline    Assessment & Plan:  33 y.o. H2E6966 at [redacted]w[redacted]d admitted for Labor_induction_indication: BMI -GBS: negative -IP Antibiotics: abx: none -Membranes ruptured, clear fluid @ 0838 -Recheck:Evaluated by digital exam. -Preeclampsia:  labs stable -Pain: none -Continue present management. and Intervention: AROM,  IV Pitocin  augmentation, increase Pitocin  rate, change maternal position, and cook cervical catheter out with tug after about 2 hours -Pe_uterus_labor: Pitocin  at 4 mu/min. -Analgesia: regional anesthesia -GC/CT in process  Lynia Landry, CNM  08/01/2023 9:09 AM  [facility]

## 2023-08-01 NOTE — Discharge Summary (Signed)
 Postpartum Discharge Summary  Patient Name: Susan Cantrell DOB: 17-Feb-1990 MRN: [id number]  Date of admission: 08/01/2023 Delivery date:08/01/2023 Delivering provider: Jhoan Schmieder Date of discharge: 08/02/2023  Primary OB: [facility] OFE:Ejupzwu'd last menstrual period was 11/01/2022. EDC Estimated Date of Delivery: 08/08/23 Gestational Age at Delivery: [redacted]w[redacted]d   Admitting diagnosis: Encounter for elective induction of labor [Z34.90] Intrauterine pregnancy: [redacted]w[redacted]d     Secondary diagnosis:   Principal Problem:   Encounter for planned induction of labor   Discharge Diagnosis: Term Pregnancy Delivered      Hospital course: Induction of Labor With Vaginal Delivery   33 y.o. yo [id number] at [redacted]w[redacted]d was admitted to the hospital 08/01/2023 for induction of labor.  Indication for induction: Obesity BMI>40.  Patient had an labor course complicated by inadequate pain relief and intermittent category 2 FHT Membrane Rupture Time/Date: 8:38 AM,08/01/2023  Delivery Method:Vaginal, Spontaneous Operative Delivery:N/A Episiotomy: None Lacerations:  None Details of delivery can be found in separate delivery note.  Patient had a postpartum course complicated by iron  deficiency anemia due to blood loss. She was started on oral iron  twice daily. Patient was deemed stable and discharged home 08/02/23.  Newborn Data: Birth date:08/01/2023 Birth time:3:31 PM Gender:Female Living status:Living Apgars:8 ,9  Weight:3470 g                                            Post partum procedures:none Induction:: AROM, Pitocin , and IP Foley Complications: None Delivery Type: spontaneous vaginal delivery Anesthesia: epidural anesthesia x3 Placenta: spontaneous To Pathology: No   Prenatal Labs:   Blood type/Rh A POS   Antibody screen Negative    Rubella Immune (01/08 0000)   Varicella Immune  RPR NR    HBsAg Negative (01/08 0000)  Hep C NR   HIV Non-reactive (05/07 0000)   GC neg  Chlamydia  Pos third trimester  Genetic screening cfDNA negative   1 hour GTT 74  3 hour GTT N/a  GBS Negative/-- (07/03 0000)     Magnesium Sulfate received: No BMZ received: No Rhophylac:was not indicated MMR: was not indicated Varivax vaccine given: was not indicated - Tdap vaccine: declined - Flu vaccine: declined -RSV vaccine:not in season  Transfusion:No  Physical exam  Vitals:   08/01/23 2231 08/02/23 0312 08/02/23 0835 08/02/23 0837  BP: (!) 107/54 114/70 114/73   Pulse: (!) 52 (!) 48 (!) 47 60  Resp: 18 18 18    Temp: 98.5 F (36.9 C) 98 F (36.7 C) 97.8 F (36.6 C)   TempSrc: Oral Oral Oral   SpO2: 99% 98% 100%   Weight:      Height:       General: alert, cooperative, and no distress Lochia: appropriate Uterine Fundus: firm Perineum:minimal edema/intact DVT Evaluation: No evidence of DVT seen on physical exam. Negative Homan's sign. No cords or calf tenderness. No significant calf/ankle edema.  Labs: Lab Results  Component Value Date   WBC 12.6 (H) 08/02/2023   HGB 9.7 (L) 08/02/2023   HCT 28.1 (L) 08/02/2023   MCV 87.5 08/02/2023   PLT 257 08/02/2023      Latest Ref Rng & Units 12/28/2021    3:50 PM  CMP  Glucose 70 - 99 mg/dL 92   BUN 6 - 20 mg/dL <5   Creatinine 9.55 - 1.00 mg/dL 9.33   Sodium 864 - 854 mmol/L 137   Potassium  3.5 - 5.1 mmol/L 4.1   Chloride 98 - 111 mmol/L 110   CO2 22 - 32 mmol/L 22   Calcium  8.9 - 10.3 mg/dL 8.8   Total Protein 6.5 - 8.1 g/dL 6.4   Total Bilirubin 0.3 - 1.2 mg/dL 0.4   Alkaline Phos 38 - 126 U/L 167   AST 15 - 41 U/L 33   ALT 0 - 44 U/L 26    Edinburgh Score:    08/01/2023   11:31 PM  Edinburgh Postnatal Depression Scale Screening Tool  I have been able to laugh and see the funny side of things. 0  I have looked forward with enjoyment to things. 0  I have blamed myself unnecessarily when things went wrong. 0  I have been anxious or worried for no good reason. 0  I have felt scared or panicky for no good  reason. 0  Things have been getting on top of me. 0  I have been so unhappy that I have had difficulty sleeping. 0  I have felt sad or miserable. 0  I have been so unhappy that I have been crying. 0  The thought of harming myself has occurred to me. 0  Edinburgh Postnatal Depression Scale Total 0    Risk assessment for postpartum VTE and prophylactic treatment: Very high risk factors: None High risk factors: None Moderate risk factors: BMI 30-40 kg/m2  Postpartum VTE prophylaxis with LMWH not indicated  After visit meds:  Allergies as of 08/02/2023       Reactions   Amoxicillin     Doxycycline     Oxycodone Swelling   Throat swelling        Medication List     STOP taking these medications    famotidine  20 MG tablet Commonly known as: PEPCID    prenatal multivitamin Tabs tablet       TAKE these medications    acetaminophen  500 MG tablet Commonly known as: TYLENOL  Take 2 tablets (1,000 mg total) by mouth every 6 (six) hours as needed (for pain scale < 4). What changed:  medication strength how much to take when to take this   benzocaine -Menthol  20-0.5 % Aero Commonly known as: DERMOPLAST Apply 1 Application topically as needed for irritation (perineal discomfort).   coconut oil Oil Apply 1 Application topically as needed.   dibucaine 1 % Oint Commonly known as: NUPERCAINAL Place 1 Application rectally as needed for hemorrhoids (if tucks not working).   ferrous sulfate  325 (65 FE) MG tablet Take 1 tablet (325 mg total) by mouth 2 (two) times daily with a meal. Take with Vitamin C What changed: additional instructions   ibuprofen  600 MG tablet Commonly known as: ADVIL  Take 1 tablet (600 mg total) by mouth every 6 (six) hours. What changed: when to take this   senna-docusate 8.6-50 MG tablet Commonly known as: Senokot-S Take 2 tablets by mouth daily.   sertraline  50 MG tablet Commonly known as: ZOLOFT  Take 1 tablet (50 mg total) by mouth  daily. Start taking on: August 03, 2023   simethicone  80 MG chewable tablet Commonly known as: MYLICON Chew 1 tablet (80 mg total) by mouth as needed for flatulence.   witch hazel-glycerin  pad Commonly known as: TUCKS Apply 1 Application topically as needed for hemorrhoids (for pain).       Discharge home in stable condition Infant Feeding: Bottle Infant Disposition:home with mother Discharge instruction: per After Visit Summary and Postpartum booklet. Activity: Advance as tolerated. Pelvic rest for 6 weeks.  Diet: routine diet Anticipated Birth Control:  Contraceptives: Progesterone only pills Postpartum Appointment:6 weeks Additional Postpartum F/U: Postpartum Depression checkup Future Appointments:No future appointments. Follow up Visit:  Follow-up Information     Aisha Heller, CNM Follow up in 2 week(s).   Specialty: Obstetrics Why: mood check Contact information: [address] [place] [address] [phone]         Aisha Heller, CNM Follow up in 6 week(s).   Specialty: Obstetrics Why: pp visit Contact information: [address] [place] [address] [phone]                 Plan:  Susan Cantrell was discharged to home in good condition. Follow-up appointment as directed.    Signed: Heller Aisha CNM

## 2023-08-01 NOTE — H&P (Cosign Needed Addendum)
 OB History & Physical   History of Present Illness:   Chief Complaint: IOL for BMI  HPI:  Susan Cantrell is a 33 y.o. [phone] female at [redacted]w[redacted]d, Patient's last menstrual period was 11/01/2022., consistent with US  at [redacted]w[redacted]d, with Estimated Date of Delivery: 08/08/23.  She presents to L&D for induction of labor due to BMI. She also has a history significant for anxiety/depression, prediabetes, short interval pregnancy,chlamydia in third trimester, tobacco and THC use, HSV and h/o GHTN  Reports active fetal movement  Contractions: denies LOF/SROM: intact Vaginal bleeding: denies  Factors complicating pregnancy:   Patient Active Problem List   Diagnosis Date Noted   Encounter for elective induction of labor 08/01/2023   Extreme obesity 04/18/2023   Short interval between pregnancies affecting pregnancy, antepartum 03/23/2023   History of gestational hypertension 03/21/2023   Supervision of high risk pregnancy, antepartum, second trimester 03/21/2023   Supervision of high risk pregnancy in third trimester 12/21/2022   Maternal tobacco use in second trimester 08/27/2021   Obesity, morbid, BMI 50 or higher (HCC) 10/06/2015   Morbid obesity with BMI of 40.0-44.9, adult (HCC) 05/16/2012    Prenatal Transfer Tool  Maternal Diabetes: No Genetic Screening: Normal Maternal Ultrasounds/Referrals: Normal Fetal Ultrasounds or other Referrals:  Referred to Materal Fetal Medicine  Maternal Substance Abuse:  Yes:  Type: Smoker, Marijuana Significant Maternal Medications:  None Significant Maternal Lab Results: Group B Strep negative  Maternal Medical History:   Past Medical History:  Diagnosis Date   Closed impacted fracture of left hip with routine healing 2014   no sx required   Depression, postpartum 05/29/2019   First trimester screening 03/24/2015   Fracture of coccyx, initial encounter for closed fracture (HCC) 2014   no sx required   Gestational hypertension w/o significant  proteinuria in 3rd trimester 12/28/2021   Indication for care in labor and delivery, antepartum 09/10/2015   Labor and delivery, indication for care 10/04/2015   Non-reactive NST (non-stress test) 09/29/2015   Postpartum care following vaginal delivery 10/06/2015   Pregnancy    EDC 09/29/2015   Right flank pain 12/10/2021   Supervision of high risk pregnancy in third trimester 06/17/2021   Formatting of this note might be different from the original. 33 y.o. H3E7967 at [redacted]w[redacted]d. Patient's last menstrual period was 04/04/2021 (exact date). consistent with  ultrasound @ [redacted]w[redacted]d. Estimated Date of Delivery: 01/09/22 Sex of baby and name:  female Bebe Delores Raddle    Partner:    Bebe Delores Factors complicating this pregnancy GBS positive - Clindamycin  resistant Anemia - encouraged to start    Supervision of other high risk pregnancy, antepartum 03/22/2019    Past Surgical History:  Procedure Laterality Date   DILATION AND EVACUATION N/A 03/05/2020   Procedure: DILATATION AND EVACUATION;  Surgeon: Lovetta Debby PARAS, MD;  Location: [facility] ORS;  Service: Gynecology;  Laterality: N/A;   TYMPANOSTOMY TUBE PLACEMENT      Allergies  Allergen Reactions   Amoxicillin     Doxycycline     Oxycodone Swelling    Throat swelling    Prior to Admission medications   Medication Sig Start Date End Date Taking? Authorizing Provider  acetaminophen  (TYLENOL ) 325 MG tablet Take 2 tablets (650 mg total) by mouth every 4 (four) hours as needed (for pain scale < 4). 12/30/21   Tanda Edsel Fuller, CNM  benzocaine -Menthol  (DERMOPLAST) 20-0.5 % AERO Apply 1 Application topically as needed for irritation (perineal discomfort). Patient not taking: Reported on 04/20/2023 12/30/21   Wilson, Danielle  Renee, CNM  famotidine  (PEPCID ) 20 MG tablet Take 1 tablet (20 mg total) by mouth 2 (two) times daily. Patient not taking: Reported on 04/20/2023 12/10/21   McVey, Asberry LABOR, CNM  ferrous sulfate  325 (65 FE) MG tablet Take 1  tablet (325 mg total) by mouth 2 (two) times daily with a meal. For anemia, take with Vitamin C 12/30/21 04/29/22  Tanda Edsel Fuller, CNM  ibuprofen  (ADVIL ) 600 MG tablet Take 1 tablet (600 mg total) by mouth every 6 (six) hours. Patient not taking: Reported on 04/20/2023 12/30/21   Tanda Edsel Fuller, CNM  Prenatal Vit-Fe Fumarate-FA (PRENATAL MULTIVITAMIN) TABS tablet Take 1 tablet by mouth daily at 12 noon. 12/30/21   Tanda Edsel Fuller, CNM  witch hazel-glycerin  (TUCKS) pad Apply 1 Application topically as needed for hemorrhoids (for pain). Patient not taking: Reported on 04/20/2023 12/30/21   Tanda Edsel Fuller, CNM     Prenatal care site:  [facility]  OB History  Gravida Para Term Preterm AB Living  7 3 3  0 3 3  SAB IAB Ectopic Multiple Live Births  3 0 0 0 3    # Outcome Date GA Lbr Len/2nd Weight Sex Type Anes PTL Lv  7 Current           6 Term 12/29/21 [redacted]w[redacted]d 04:54 / 00:11 3430 g M Vag-Spont EPI  LIV     Name: Cantrell,BOY Susan     Apgar1: 9  Apgar5: 9  5 Term 03/22/19 [redacted]w[redacted]d 07:50 / 00:14 3540 g F Vag-Spont EPI  LIV     Name: Cantrell,GIRL Susan     Apgar1: 8  Apgar5: 9  4 Term 10/06/15 [redacted]w[redacted]d / 01:03 3890 g F Vag-Spont EPI  LIV     Name: Cantrell,GIRL Susan     Apgar1: 9  Apgar5: 9  3 SAB           2 SAB           1 SAB              Social History: She  reports that she has been smoking cigarettes. She started smoking about 9 years ago. She has a 0.1 pack-year smoking history. She has never used smokeless tobacco. She reports that she does not currently use alcohol after a past usage of about 6.0 standard drinks of alcohol per week. She reports current drug use. Drug: Marijuana.  Family History: family history includes Asthma in her brother; Cancer in her maternal grandmother; Diabetes in her maternal grandmother.   Review of Systems: A full review of systems was performed and negative except as noted in the HPI.     Physical Exam:  Vital  Signs: BP (!) 124/45   Pulse 69   Temp 98.7 F (37.1 C) (Oral)   LMP 11/01/2022   General: no acute distress.  HEENT: normocephalic, atraumatic Heart: regular rate & rhythm Lungs: normal respiratory effort Abdomen: soft, gravid, non-tender; Pelvic:   External: Normal external female genitalia  Cervix: Dilation: 2.5 / Effacement (%): 50 / Station: -2    Extremities: non-tender, symmetric, no edema bilaterally.  DTRs: +2  Neurologic: Alert & oriented x 3.    Results for orders placed or performed during the hospital encounter of 08/01/23 (from the past 24 hours)  CBC     Status: Abnormal   Collection Time: 08/01/23  6:09 AM  Result Value Ref Range   WBC 7.5 4.0 - 10.5 K/uL   RBC 3.61 (L) 3.87 - 5.11  MIL/uL   Hemoglobin 10.8 (L) 12.0 - 15.0 g/dL   HCT 68.3 (L) 63.9 - 53.9 %   MCV 87.5 80.0 - 100.0 fL   MCH 29.9 26.0 - 34.0 pg   MCHC 34.2 30.0 - 36.0 g/dL   RDW 86.7 88.4 - 84.4 %   Platelets 317 150 - 400 K/uL   nRBC 0.0 0.0 - 0.2 %  Type and screen     Status: None   Collection Time: 08/01/23  6:09 AM  Result Value Ref Range   ABO/RH(D) A POS    Antibody Screen NEG    Sample Expiration      08/04/2023,2359 Performed at [facility], [address] [place], [place] [address]     Pertinent Results:  Prenatal Labs: Blood type/Rh A POS   Antibody screen Negative    Rubella Immune (01/08 0000)   Varicella Immune  RPR NR    HBsAg Negative (01/08 0000)  Hep C NR   HIV Non-reactive (05/07 0000)   GC neg  Chlamydia Pos third trimester  Genetic screening cfDNA negative   1 hour GTT 74  3 hour GTT N/a  GBS Negative/-- (07/03 0000)    FHT:  FHR: 135 bpm, variability: moderate,  accelerations:  Present,  decelerations:  Absent Category/reactivity:  Category I UC:   none   Cephalic by Leopolds and SVE   No results found.  Assessment:  LATANZA PFEFFERKORN is a 33 y.o. [phone] female at [redacted]w[redacted]d with Obesity BMI>40,prediabetes, short interval pregnancy.    Plan:  1. Admit to Labor & Delivery - consents reviewed and obtained - .Dr. CHARM Dinsmore MD notified of admission and plan of care   2. Fetal Well being  - Fetal Tracing: Category 1 - Group B Streptococcus ppx not indicated: GBS negative - Presentation: cephalic confirmed by SVE   3. Routine OB: - Prenatal labs reviewed, as above - Rh positive - CBC, T&S, RPR on admit - Clear liquid diet , continuous IV fluids  4. Induction of labor  - Contractions monitored with external toco - Pelvis proven to 3890g adequate for trial of labor  - Plan for induction with oxytocin  and cervical balloon  - Augmentation with oxytocin  and AROM as appropriate  - Plan for  continuous fetal monitoring - Maternal pain control as desired; planning regional anesthesia and IVPM - Anticipate vaginal delivery. 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 Post Partum Planning: - Infant feeding: formula feeding - Contraception: Contraceptives: Progesterone only pills - Tdap vaccine: declined - Flu vaccine: declined -RSV vaccine:declined  Susan Cantrell, CNM 08/01/23 7:28 AM  Bobbette Brunswick, CNM Certified Nurse Midwife [facility]

## 2023-08-01 NOTE — Progress Notes (Signed)
 L&D Note    Subjective:  Feeling comfortable with epidural  Objective:      08/01/2023   11:22 AM 08/01/2023   11:03 AM 08/01/2023    9:48 AM  Vitals with BMI  Height 5' 8    Weight 268 lbs 15 oz    BMI 40.9    Systolic  104 121  Diastolic  66 73  Pulse  62 59     Gen: alert, cooperative, no distress FHR: Baseline: 130 bpm, Variability: moderate, Accels: Present, Decels: none IUPC: regular, every 2 minutes SVE: Dilation: 6 Effacement (%): 70 Cervical Position: Posterior Station: -2 Presentation: Vertex Exam by:: Aisha, CNM  Medications SCHEDULED MEDICATIONS   ammonia        misoprostol        misoprostol   25 mcg Oral Q4H   And   misoprostol   25 mcg Vaginal Q4H   oxytocin        oxytocin  40 units in LR 1000 mL  333 mL Intravenous Once    MEDICATION INFUSIONS   fentaNYL  2 mcg/mL w/bupivacaine  0.125% in NS 250 mL 12 mL/hr (08/01/23 0830)   lactated ringers      lactated ringers  125 mL/hr at 08/01/23 1117   oxytocin      oxytocin  6 milli-units/min (08/01/23 1235)    PRN MEDICATIONS  acetaminophen , ammonia , diphenhydrAMINE , ePHEDrine , ePHEDrine , fentaNYL  (SUBLIMAZE ) injection, fentaNYL  2 mcg/mL w/bupivacaine  0.125% in NS 250 mL, lactated ringers , lidocaine  (PF), misoprostol , ondansetron , oxytocin , phenylephrine , phenylephrine , sodium citrate -citric acid , terbutaline    Assessment & Plan:  33 y.o. H2E6966 at [redacted]w[redacted]d admitted for Labor_induction_indication: obesity BMI >40 -GBS: negative -IP Antibiotics: abx: none -Membranes ruptured, clear fluid -Recheck:Evaluated by digital exam. -Preeclampsia:  labs stable -Pain: none -Intervention: IV Pitocin  augmentation, increase Pitocin  rate, change maternal position, and place IUPC -Pe_uterus_labor: Pitocin  at 6 mu/min., UC baseline tone by IUPC is 5 mmHg., UC intensity by IUPC averages 25 mmHg over baseline., and Adequate relaxation between contractions. -Analgesia: regional anesthesia   Netanel Yannuzzi, CNM  08/01/2023  12:41 PM  [facility]

## 2023-08-01 NOTE — Anesthesia Procedure Notes (Signed)
 Epidural Patient location during procedure: OB Start time: 08/01/2023 8:06 AM End time: 08/01/2023 8:16 AM  Staffing Anesthesiologist: Piscitello, Fairy POUR, MD Resident/CRNA: Jaylene Nest, CRNA Performed: anesthesiologist   Preanesthetic Checklist Completed: patient identified, IV checked, site marked, risks and benefits discussed, surgical consent, monitors and equipment checked, pre-op evaluation and timeout performed  Epidural Patient position: sitting Prep: ChloraPrep Patient monitoring: heart rate, continuous pulse ox and blood pressure Approach: midline Location: L3-L4 Injection technique: LOR saline  Needle:  Needle type: Tuohy  Needle gauge: 17 G Needle length: 9 cm and 9 Needle insertion depth: 6 cm Catheter type: closed end flexible Catheter size: 19 Gauge Catheter at skin depth: 11 cm Test dose: negative and 1.5% lidocaine  with Epi 1:200 K  Assessment Events: blood not aspirated, no cerebrospinal fluid, injection not painful, no injection resistance, no paresthesia and negative IV test  Additional Notes 1 attempt Pt. Evaluated and documentation done after procedure finished. Patient identified. Risks/Benefits/Options discussed with patient including but not limited to bleeding, infection, nerve damage, paralysis, failed block, incomplete pain control, headache, blood pressure changes, nausea, vomiting, reactions to medication both or allergic, itching and postpartum back pain. Confirmed with bedside nurse the patient's most recent platelet count. Confirmed with patient that they are not currently taking any anticoagulation, have any bleeding history or any family history of bleeding disorders. Patient expressed understanding and wished to proceed. All questions were answered. Sterile technique was used throughout the entire procedure. Please see nursing notes for vital signs. Test dose was given through epidural catheter and negative prior to continuing to dose  epidural or start infusion. Warning signs of high block given to the patient including shortness of breath, tingling/numbness in hands, complete motor block, or any concerning symptoms with instructions to call for help. Patient was given instructions on fall risk and not to get out of bed. All questions and concerns addressed with instructions to call with any issues or inadequate analgesia.    Patient tolerated the insertion well without immediate complications.Reason for block:procedure for pain

## 2023-08-02 LAB — CBC
HCT: 28.1 % — ABNORMAL LOW (ref 36.0–46.0)
Hemoglobin: 9.7 g/dL — ABNORMAL LOW (ref 12.0–15.0)
MCH: 30.2 pg (ref 26.0–34.0)
MCHC: 34.5 g/dL (ref 30.0–36.0)
MCV: 87.5 fL (ref 80.0–100.0)
Platelets: 257 K/uL (ref 150–400)
RBC: 3.21 MIL/uL — ABNORMAL LOW (ref 3.87–5.11)
RDW: 13.3 % (ref 11.5–15.5)
WBC: 12.6 K/uL — ABNORMAL HIGH (ref 4.0–10.5)
nRBC: 0 % (ref 0.0–0.2)

## 2023-08-02 MED ORDER — BENZOCAINE-MENTHOL 20-0.5 % EX AERO: 1.0000 | INHALATION_SPRAY | CUTANEOUS | Status: AC | PRN

## 2023-08-02 MED ORDER — ACETAMINOPHEN 500 MG PO TABS: 1000.0000 mg | ORAL_TABLET | Freq: Four times a day (QID) | ORAL | 0 refills | Status: AC | PRN

## 2023-08-02 MED ORDER — SIMETHICONE 80 MG PO CHEW: 80.0000 mg | CHEWABLE_TABLET | ORAL | Status: AC | PRN

## 2023-08-02 MED ORDER — WITCH HAZEL-GLYCERIN EX PADS: 1.0000 | MEDICATED_PAD | CUTANEOUS | 12 refills | Status: AC | PRN

## 2023-08-02 MED ORDER — SERTRALINE HCL 50 MG PO TABS: 50.0000 mg | ORAL_TABLET | Freq: Every day | ORAL | 1 refills | Status: AC

## 2023-08-02 MED ORDER — SENNOSIDES-DOCUSATE SODIUM 8.6-50 MG PO TABS: 2.0000 | ORAL_TABLET | ORAL | Status: AC

## 2023-08-02 MED ORDER — SERTRALINE HCL 25 MG PO TABS
50.0000 mg | ORAL_TABLET | Freq: Every day | ORAL | Status: DC
  Administered 2023-08-02: 50 mg via ORAL
  Filled 2023-08-02: qty 2

## 2023-08-02 MED ORDER — FERROUS SULFATE 325 (65 FE) MG PO TABS: 325.0000 mg | ORAL_TABLET | Freq: Two times a day (BID) | ORAL | 1 refills | Status: AC

## 2023-08-02 MED ORDER — DIBUCAINE (PERIANAL) 1 % EX OINT: 1.0000 | TOPICAL_OINTMENT | CUTANEOUS | Status: AC | PRN

## 2023-08-02 MED ORDER — COCONUT OIL OIL: 1.0000 | TOPICAL_OIL | Status: AC | PRN

## 2023-08-02 MED ORDER — IBUPROFEN 600 MG PO TABS: 600.0000 mg | ORAL_TABLET | Freq: Four times a day (QID) | ORAL | 0 refills | Status: AC

## 2023-08-02 NOTE — Anesthesia Postprocedure Evaluation (Signed)
 Anesthesia Post Note  Patient: Susan Cantrell  Procedure(s) Performed: AN AD HOC LABOR EPIDURAL  Patient location during evaluation: Mother Baby Anesthesia Type: Epidural Level of consciousness: awake and alert Pain management: pain level controlled Vital Signs Assessment: post-procedure vital signs reviewed and stable Respiratory status: spontaneous breathing, nonlabored ventilation and respiratory function stable Cardiovascular status: stable Postop Assessment: no headache, no backache and epidural receding Anesthetic complications: no   No notable events documented.   Last Vitals:  Vitals:   08/01/23 2231 08/02/23 0312  BP: (!) 107/54 114/70  Pulse: (!) 52 (!) 48  Resp: 18 18  Temp: 36.9 C 36.7 C  SpO2: 99% 98%    Last Pain:  Vitals:   08/02/23 0641  TempSrc:   PainSc: Asleep                 Madalyn Hammock

## 2023-08-31 ENCOUNTER — Encounter: Payer: Self-pay | Admitting: Emergency Medicine

## 2023-08-31 ENCOUNTER — Emergency Department
Admission: EM | Admit: 2023-08-31 | Discharge: 2023-08-31 | Disposition: A | Discharge status: Home / Self Care | Attending: Emergency Medicine | Admitting: Emergency Medicine

## 2023-08-31 ENCOUNTER — Other Ambulatory Visit: Payer: Self-pay

## 2023-08-31 DIAGNOSIS — O9943 Diseases of the circulatory system complicating the puerperium: Secondary | ICD-10-CM | POA: Diagnosis not present

## 2023-08-31 DIAGNOSIS — M545 Low back pain, unspecified: Secondary | ICD-10-CM | POA: Insufficient documentation

## 2023-08-31 DIAGNOSIS — R001 Bradycardia, unspecified: Secondary | ICD-10-CM | POA: Insufficient documentation

## 2023-08-31 DIAGNOSIS — O99893 Other specified diseases and conditions complicating puerperium: Secondary | ICD-10-CM | POA: Diagnosis present

## 2023-08-31 LAB — CBC WITH DIFFERENTIAL/PLATELET
Abs Immature Granulocytes: 0.02 K/uL (ref 0.00–0.07)
Basophils Absolute: 0 K/uL (ref 0.0–0.1)
Basophils Relative: 0 %
Eosinophils Absolute: 0.1 K/uL (ref 0.0–0.5)
Eosinophils Relative: 1 %
HCT: 35.8 % — ABNORMAL LOW (ref 36.0–46.0)
Hemoglobin: 11.8 g/dL — ABNORMAL LOW (ref 12.0–15.0)
Immature Granulocytes: 0 %
Lymphocytes Relative: 37 %
Lymphs Abs: 2 K/uL (ref 0.7–4.0)
MCH: 29.8 pg (ref 26.0–34.0)
MCHC: 33 g/dL (ref 30.0–36.0)
MCV: 90.4 fL (ref 80.0–100.0)
Monocytes Absolute: 0.4 K/uL (ref 0.1–1.0)
Monocytes Relative: 7 %
Neutro Abs: 3 K/uL (ref 1.7–7.7)
Neutrophils Relative %: 55 %
Platelets: 305 K/uL (ref 150–400)
RBC: 3.96 MIL/uL (ref 3.87–5.11)
RDW: 13.2 % (ref 11.5–15.5)
WBC: 5.5 K/uL (ref 4.0–10.5)
nRBC: 0 % (ref 0.0–0.2)

## 2023-08-31 LAB — BASIC METABOLIC PANEL WITH GFR
Anion gap: 9 (ref 5–15)
BUN: 8 mg/dL (ref 6–20)
CO2: 25 mmol/L (ref 22–32)
Calcium: 8.9 mg/dL (ref 8.9–10.3)
Chloride: 106 mmol/L (ref 98–111)
Creatinine, Ser: 0.67 mg/dL (ref 0.44–1.00)
GFR, Estimated: >60 mL/min (ref >60)
Glucose, Bld: 100 mg/dL — ABNORMAL HIGH (ref 70–99)
Potassium: 4.1 mmol/L (ref 3.5–5.1)
Sodium: 140 mmol/L (ref 135–145)

## 2023-08-31 MED ORDER — NAPROXEN 500 MG PO TABS: 500.0000 mg | ORAL_TABLET | Freq: Two times a day (BID) | ORAL | 0 refills | Status: AC

## 2023-08-31 MED ORDER — LIDOCAINE 5 % EX PTCH: 1.0000 | MEDICATED_PATCH | Freq: Two times a day (BID) | CUTANEOUS | 0 refills | Status: AC

## 2023-08-31 MED ORDER — KETOROLAC TROMETHAMINE 15 MG/ML IJ SOLN
15.0000 mg | Freq: Once | INTRAMUSCULAR | Status: AC
  Administered 2023-08-31: 15 mg via INTRAMUSCULAR
  Filled 2023-08-31: qty 1

## 2023-08-31 MED ORDER — LIDOCAINE 5 % EX PTCH
1.0000 | MEDICATED_PATCH | CUTANEOUS | Status: DC
  Administered 2023-08-31: 1 via TRANSDERMAL
  Filled 2023-08-31: qty 1

## 2023-08-31 MED ORDER — ACETAMINOPHEN 325 MG PO TABS
650.0000 mg | ORAL_TABLET | Freq: Once | ORAL | Status: AC
  Administered 2023-08-31: 650 mg via ORAL
  Filled 2023-08-31: qty 2

## 2023-08-31 NOTE — Discharge Instructions (Signed)
 Please follow-up with OB/GYN in the next 2 to 3 days given your low heart rate.  I spoke with the OB/GYN team today during your ER visit, they recommend that when you call you say that you need an ER follow-up for your heart rate and that you need to be seen within 2 to 3 days. Please return to the emergency department if you develop dizziness, lightheadedness, chest pain, shortness of breath, swelling in your legs, or any other concerns.  Regarding your back pain, you take the medications as prescribed.  Please practice the proper ergonomic positioning when lifting your baby as we discussed.  Please return if you develop any weakness or numbness in your lower extremities, difficulty with urinating or moving your bowels, or having incontinence, difficulty walking, or any other concerns.  It was a pleasure caring for you today.

## 2023-08-31 NOTE — ED Provider Notes (Signed)
 [facility] Provider Note    None    (approximate)   History   Back Pain   HPI  Susan Cantrell is a 33 y.o. female who presents today for left low back pain.  Patient had induction of labor at 39 weeks due to obesity.  Patient reports that she has had left low back pain that is worsened with leaning over and picking up her baby for the last couple of days.  She reports that she is always leaning over to change the baby or pick up the baby, and also has a 83-year-old who she has to pick up all the time.  She reports that her pain is worsened when leaning over to put the baby in the car seat.  No radiation of pain into her abdomen or down her legs.  No urinary or fecal incontinence or retention.  She denies any chest pain or shortness of breath.  She has not had any lower extremity swelling.  No orthopnea or dyspnea on exertion.  Patient reports that she is not breast-feeding, her baby is 100% formula fed.  Patient Active Problem List   Diagnosis Date Noted   Encounter for planned induction of labor 08/01/2023   Extreme obesity 04/18/2023   Short interval between pregnancies affecting pregnancy, antepartum 03/23/2023   History of gestational hypertension 03/21/2023   Supervision of high risk pregnancy, antepartum, second trimester 03/21/2023   Supervision of high risk pregnancy in third trimester 12/21/2022   Maternal tobacco use in second trimester 08/27/2021   Obesity, morbid, BMI 50 or higher (HCC) 10/06/2015   Morbid obesity with BMI of 40.0-44.9, adult (HCC) 05/16/2012          Physical Exam   Triage Vital Signs: ED Triage Vitals  Encounter Vitals Group     BP 08/31/23 0821 119/61     Girls Systolic BP Percentile --      Girls Diastolic BP Percentile --      Boys Systolic BP Percentile --      Boys Diastolic BP Percentile --      Pulse Rate 08/31/23 0821 (!) 47     Resp 08/31/23 0821 18     Temp 08/31/23 0821 98.1 F (36.7 C)     Temp  Source 08/31/23 0821 Oral     SpO2 08/31/23 0821 100 %     Weight 08/31/23 0820 256 lb (116.1 kg)     Height 08/31/23 0820 5' 8 (1.727 m)     Head Circumference --      Peak Flow --      Pain Score 08/31/23 0820 6     Pain Loc --      Pain Education --      Exclude from Growth Chart --     Most recent vital signs: Vitals:   08/31/23 0948 08/31/23 1102  BP: 121/85 118/83  Pulse: (!) 42 (!) 45  Resp: 18 18  Temp: 98.5 F (36.9 C)   SpO2: 99% 100%    Physical Exam Vitals and nursing note reviewed.  Constitutional:      General: Awake and alert. No acute distress.    Appearance: Normal appearance.   HENT:     Head: Normocephalic and atraumatic.     Mouth: Mucous membranes are moist.  Eyes:     General: PERRL. Normal EOMs        Right eye: No discharge.        Left eye: No discharge.  Conjunctiva/sclera: Conjunctivae normal.  Cardiovascular:     Rate and Rhythm: Normal rate and regular rhythm.     Pulses: Normal pulses.  Pulmonary:     Effort: Pulmonary effort is normal. No respiratory distress.     Breath sounds: Normal breath sounds.  Abdominal:     Abdomen is soft. There is no abdominal tenderness. No rebound or guarding. No distention. Musculoskeletal:        General: No swelling. Normal range of motion.     Cervical back: Normal range of motion and neck supple.  Back: No midline tenderness.  Tenderness palpation to left lumbar paraspinal muscle area.  Strength and sensation 5/5 to bilateral lower extremities. Normal great toe extension against resistance. Normal sensation throughout feet. Normal patellar reflexes. Negative SLR and opposite SLR bilaterally. Negative FABER test No lower extremity edema Skin:    General: Skin is warm and dry.     Capillary Refill: Capillary refill takes less than 2 seconds.     Findings: No rash.  Neurological:     Mental Status: The patient is awake and alert.      ED Results / Procedures / Treatments   Labs (all labs  ordered are listed, but only abnormal results are displayed) Labs Reviewed  BASIC METABOLIC PANEL WITH GFR - Abnormal; Notable for the following components:      Result Value   Glucose, Bld 100 (*)    All other components within normal limits  CBC WITH DIFFERENTIAL/PLATELET - Abnormal; Notable for the following components:   Hemoglobin 11.8 (*)    HCT 35.8 (*)    All other components within normal limits     EKG     RADIOLOGY     PROCEDURES:  Critical Care performed:   Procedures   MEDICATIONS ORDERED IN ED: Medications  lidocaine  (LIDODERM ) 5 % 1 patch (1 patch Transdermal Patch Applied 08/31/23 0913)  ketorolac  (TORADOL ) 15 MG/ML injection 15 mg (15 mg Intramuscular Given 08/31/23 0913)  acetaminophen  (TYLENOL ) tablet 650 mg (650 mg Oral Given 08/31/23 0912)     IMPRESSION / MDM / ASSESSMENT AND PLAN / ED COURSE  I reviewed the triage vital signs and the nursing notes.   Differential diagnosis includes, but is not limited to, muscle strain, muscle spasm, sacroiliitis.  I reviewed the patient's chart.  Patient was discharged on 08/02/2023 after an elective induction of labor.  Patient has been persistently bradycardic in the 40s.  EKG obtained reveals sinus bradycardia.  This was also reviewed by attending MD, Dr. Ernest.  She denies feeling any lightheadedness/dizziness, no shortness of breath or orthopnea, no dyspnea on exertion, no lower extremity swelling, no signs of postpartum cardiomyopathy.  I reviewed the patient's chart, this is lower than she has been recently.  I discussed with [facility] on-call, Delon Coe who feels that there is nothing further to do today regarding her asymptomatic bradycardia but recommends close outpatient follow-up in the next 2 to 3 days.  Regarding her back pain, patient has reproducible tenderness to her left lumbar paraspinal muscle area.  There is no radiation into her legs.  She has no midline tenderness or  weakness/paresthesias in her legs to suggest injury from her epidurals.  I do not suspect spinal cord compression or cauda equina, low suspicion for epidural hematoma.  She is able to ambulate with a steady gait, unassisted and has not had any urinary or fecal incontinence or retention.  She felt significantly improved after the Toradol  and Lidoderm  patch.  She is requesting these to go home with.  We discussed proper ergonomics when picking up her baby/other children.  She is not breast-feeding at all, she is using 100% formula for her baby.  We discussed return precautions and outpatient follow-up.  Patient agrees to call OB/GYN today for her follow-up in 2 to 3 days.  She was discharged in stable condition.  Patient's presentation is most consistent with acute complicated illness / injury requiring diagnostic workup.  Case was discussed with Dr. Ernest who agrees with assessment and plan.   Clinical Course as of 08/31/23 1111  Wed Aug 31, 2023  1039 Discussed with Delon Coe, CNM who feels nothing further to do about her asymptomatic bradycardia but recommends that she follow-up in the clinic within 2 to 3 days. [JP]  1054 Patient feels markedly improved from her back pain standpoint and is ready for discharge home [JP]    Clinical Course User Index [JP] Datron Brakebill E, PA-C     FINAL CLINICAL IMPRESSION(S) / ED DIAGNOSES   Final diagnoses:  Sinus bradycardia  Acute left-sided low back pain without sciatica     Rx / DC Orders   ED Discharge Orders          Ordered    naproxen  (NAPROSYN ) 500 MG tablet  2 times daily with meals        08/31/23 1105    lidocaine  (LIDODERM ) 5 %  Every 12 hours        08/31/23 1105             Note:  This document was prepared using Dragon voice recognition software and may include unintentional dictation errors.   Janellie Tennison E, PA-C 08/31/23 1111    Ernest Ronal BRAVO, MD 08/31/23 [phone]

## 2023-08-31 NOTE — ED Triage Notes (Signed)
 Patient to ED via POV for left lower back pain. Started yesterday. Denies injury. 1 month post-partum.
